# Patient Record
Sex: Male | Born: 1935 | Race: White | Hispanic: No | Smoking: Former smoker
Health system: Southern US, Community
[De-identification: ages and names within clinical notes are randomized; demographics above are authoritative.]

## PROBLEM LIST (undated history)

## (undated) DIAGNOSIS — E119 Type 2 diabetes mellitus without complications: Secondary | ICD-10-CM

## (undated) DIAGNOSIS — K922 Gastrointestinal hemorrhage, unspecified: Secondary | ICD-10-CM

## (undated) DIAGNOSIS — I2510 Atherosclerotic heart disease of native coronary artery without angina pectoris: Secondary | ICD-10-CM

## (undated) DIAGNOSIS — I4891 Unspecified atrial fibrillation: Secondary | ICD-10-CM

## (undated) DIAGNOSIS — Z950 Presence of cardiac pacemaker: Secondary | ICD-10-CM

## (undated) DIAGNOSIS — F329 Major depressive disorder, single episode, unspecified: Secondary | ICD-10-CM

## (undated) DIAGNOSIS — I509 Heart failure, unspecified: Secondary | ICD-10-CM

## (undated) DIAGNOSIS — Z789 Other specified health status: Secondary | ICD-10-CM

## (undated) DIAGNOSIS — N182 Chronic kidney disease, stage 2 (mild): Secondary | ICD-10-CM

## (undated) DIAGNOSIS — I495 Sick sinus syndrome: Secondary | ICD-10-CM

## (undated) DIAGNOSIS — I219 Acute myocardial infarction, unspecified: Secondary | ICD-10-CM

## (undated) DIAGNOSIS — F32A Depression, unspecified: Secondary | ICD-10-CM

## (undated) DIAGNOSIS — K219 Gastro-esophageal reflux disease without esophagitis: Secondary | ICD-10-CM

## (undated) DIAGNOSIS — I951 Orthostatic hypotension: Secondary | ICD-10-CM

## (undated) DIAGNOSIS — N289 Disorder of kidney and ureter, unspecified: Secondary | ICD-10-CM

## (undated) DIAGNOSIS — I639 Cerebral infarction, unspecified: Secondary | ICD-10-CM

## (undated) DIAGNOSIS — R531 Weakness: Secondary | ICD-10-CM

## (undated) DIAGNOSIS — E785 Hyperlipidemia, unspecified: Secondary | ICD-10-CM

## (undated) DIAGNOSIS — H409 Unspecified glaucoma: Secondary | ICD-10-CM

## (undated) DIAGNOSIS — W19XXXA Unspecified fall, initial encounter: Secondary | ICD-10-CM

## (undated) DIAGNOSIS — R42 Dizziness and giddiness: Secondary | ICD-10-CM

## (undated) DIAGNOSIS — F0390 Unspecified dementia without behavioral disturbance: Secondary | ICD-10-CM

## (undated) DIAGNOSIS — F0150 Vascular dementia without behavioral disturbance: Secondary | ICD-10-CM

## (undated) DIAGNOSIS — D649 Anemia, unspecified: Secondary | ICD-10-CM

## (undated) HISTORY — DX: Disorder of kidney and ureter, unspecified: N28.9

## (undated) HISTORY — DX: Orthostatic hypotension: I95.1

## (undated) HISTORY — DX: Other specified health status: Z78.9

## (undated) HISTORY — DX: Gastrointestinal hemorrhage, unspecified: K92.2

## (undated) HISTORY — DX: Atherosclerotic heart disease of native coronary artery without angina pectoris: I25.10

## (undated) HISTORY — DX: Unspecified atrial fibrillation: I48.91

## (undated) HISTORY — DX: Hyperlipidemia, unspecified: E78.5

## (undated) HISTORY — DX: Sick sinus syndrome: I49.5

## (undated) HISTORY — DX: Acute myocardial infarction, unspecified: I21.9

## (undated) HISTORY — DX: Dizziness and giddiness: R42

## (undated) HISTORY — DX: Chronic kidney disease, stage 2 (mild): N18.2

## (undated) HISTORY — DX: Type 2 diabetes mellitus without complications: E11.9

## (undated) HISTORY — PX: CORONARY STENT PLACEMENT: SHX1402

## (undated) HISTORY — DX: Unspecified dementia, unspecified severity, without behavioral disturbance, psychotic disturbance, mood disturbance, and anxiety: F03.90

## (undated) HISTORY — PX: PACEMAKER PLACEMENT: SHX43

## (undated) HISTORY — DX: Presence of cardiac pacemaker: Z95.0

---

## 1997-12-13 ENCOUNTER — Emergency Department (HOSPITAL_COMMUNITY): Admission: EM | Admit: 1997-12-13 | Discharge: 1997-12-13 | Payer: Self-pay | Admitting: Internal Medicine

## 1997-12-13 ENCOUNTER — Encounter: Payer: Self-pay | Admitting: Internal Medicine

## 1998-03-05 ENCOUNTER — Inpatient Hospital Stay (HOSPITAL_COMMUNITY): Admission: EM | Admit: 1998-03-05 | Discharge: 1998-03-08 | Payer: Self-pay | Admitting: Emergency Medicine

## 1998-04-16 ENCOUNTER — Other Ambulatory Visit: Admission: RE | Admit: 1998-04-16 | Discharge: 1998-04-16 | Payer: Self-pay | Admitting: Urology

## 1998-06-01 ENCOUNTER — Encounter: Payer: Self-pay | Admitting: Emergency Medicine

## 1998-06-01 ENCOUNTER — Emergency Department (HOSPITAL_COMMUNITY): Admission: EM | Admit: 1998-06-01 | Discharge: 1998-06-02 | Payer: Self-pay | Admitting: Emergency Medicine

## 1998-06-02 ENCOUNTER — Emergency Department (HOSPITAL_COMMUNITY): Admission: EM | Admit: 1998-06-02 | Discharge: 1998-06-02 | Payer: Self-pay | Admitting: Emergency Medicine

## 1999-04-07 ENCOUNTER — Encounter: Payer: Self-pay | Admitting: Family Medicine

## 1999-04-07 ENCOUNTER — Encounter: Admission: RE | Admit: 1999-04-07 | Discharge: 1999-04-07 | Payer: Self-pay | Admitting: Family Medicine

## 2001-04-12 ENCOUNTER — Ambulatory Visit (HOSPITAL_COMMUNITY): Admission: RE | Admit: 2001-04-12 | Discharge: 2001-04-12 | Payer: Self-pay | Admitting: Gastroenterology

## 2001-04-12 ENCOUNTER — Encounter: Payer: Self-pay | Admitting: Gastroenterology

## 2001-08-28 ENCOUNTER — Encounter (INDEPENDENT_AMBULATORY_CARE_PROVIDER_SITE_OTHER): Payer: Self-pay | Admitting: Gastroenterology

## 2002-01-27 ENCOUNTER — Encounter: Payer: Self-pay | Admitting: Emergency Medicine

## 2002-01-27 ENCOUNTER — Emergency Department (HOSPITAL_COMMUNITY): Admission: EM | Admit: 2002-01-27 | Discharge: 2002-01-27 | Payer: Self-pay | Admitting: Emergency Medicine

## 2002-02-02 ENCOUNTER — Ambulatory Visit (HOSPITAL_COMMUNITY): Admission: RE | Admit: 2002-02-02 | Discharge: 2002-02-02 | Payer: Self-pay | Admitting: Ophthalmology

## 2002-02-02 ENCOUNTER — Encounter: Payer: Self-pay | Admitting: Ophthalmology

## 2002-02-04 ENCOUNTER — Emergency Department (HOSPITAL_COMMUNITY): Admission: EM | Admit: 2002-02-04 | Discharge: 2002-02-04 | Payer: Self-pay | Admitting: Emergency Medicine

## 2002-11-07 ENCOUNTER — Ambulatory Visit (HOSPITAL_COMMUNITY): Admission: RE | Admit: 2002-11-07 | Discharge: 2002-11-07 | Payer: Self-pay | Admitting: Family Medicine

## 2002-11-07 ENCOUNTER — Encounter: Payer: Self-pay | Admitting: Family Medicine

## 2003-12-08 ENCOUNTER — Inpatient Hospital Stay (HOSPITAL_COMMUNITY): Admission: EM | Admit: 2003-12-08 | Discharge: 2003-12-12 | Payer: Self-pay | Admitting: Emergency Medicine

## 2004-01-02 ENCOUNTER — Inpatient Hospital Stay (HOSPITAL_COMMUNITY): Admission: EM | Admit: 2004-01-02 | Discharge: 2004-01-06 | Payer: Self-pay

## 2004-02-04 ENCOUNTER — Ambulatory Visit: Payer: Self-pay | Admitting: *Deleted

## 2004-02-04 ENCOUNTER — Ambulatory Visit: Payer: Self-pay | Admitting: Cardiology

## 2004-02-25 ENCOUNTER — Ambulatory Visit: Payer: Self-pay | Admitting: Cardiology

## 2004-03-10 ENCOUNTER — Ambulatory Visit: Payer: Self-pay | Admitting: Cardiology

## 2004-03-17 ENCOUNTER — Ambulatory Visit: Payer: Self-pay | Admitting: *Deleted

## 2004-03-24 ENCOUNTER — Ambulatory Visit: Payer: Self-pay | Admitting: Internal Medicine

## 2004-04-02 ENCOUNTER — Ambulatory Visit: Payer: Self-pay | Admitting: Cardiology

## 2004-04-16 ENCOUNTER — Ambulatory Visit: Payer: Self-pay | Admitting: Pulmonary Disease

## 2004-04-19 ENCOUNTER — Ambulatory Visit: Payer: Self-pay | Admitting: Internal Medicine

## 2004-04-24 ENCOUNTER — Ambulatory Visit: Payer: Self-pay | Admitting: Cardiology

## 2004-04-24 ENCOUNTER — Inpatient Hospital Stay (HOSPITAL_COMMUNITY): Admission: EM | Admit: 2004-04-24 | Discharge: 2004-04-26 | Payer: Self-pay | Admitting: Emergency Medicine

## 2004-05-12 ENCOUNTER — Ambulatory Visit: Payer: Self-pay | Admitting: *Deleted

## 2004-05-12 ENCOUNTER — Ambulatory Visit: Payer: Self-pay | Admitting: Cardiology

## 2004-05-31 ENCOUNTER — Ambulatory Visit: Payer: Self-pay

## 2004-09-22 ENCOUNTER — Ambulatory Visit: Payer: Self-pay | Admitting: Cardiology

## 2004-10-25 ENCOUNTER — Ambulatory Visit: Payer: Self-pay | Admitting: Cardiology

## 2004-10-28 ENCOUNTER — Ambulatory Visit: Payer: Self-pay | Admitting: Cardiology

## 2004-11-30 ENCOUNTER — Ambulatory Visit: Payer: Self-pay

## 2005-01-05 ENCOUNTER — Ambulatory Visit: Payer: Self-pay | Admitting: Gastroenterology

## 2005-01-31 ENCOUNTER — Ambulatory Visit: Payer: Self-pay | Admitting: Cardiology

## 2005-02-01 ENCOUNTER — Ambulatory Visit: Payer: Self-pay | Admitting: Gastroenterology

## 2005-02-04 ENCOUNTER — Ambulatory Visit: Payer: Self-pay | Admitting: Cardiology

## 2005-02-05 ENCOUNTER — Inpatient Hospital Stay (HOSPITAL_COMMUNITY): Admission: EM | Admit: 2005-02-05 | Discharge: 2005-02-16 | Payer: Self-pay | Admitting: Emergency Medicine

## 2005-02-06 ENCOUNTER — Ambulatory Visit: Payer: Self-pay | Admitting: Gastroenterology

## 2005-02-22 ENCOUNTER — Ambulatory Visit: Payer: Self-pay | Admitting: *Deleted

## 2005-02-28 ENCOUNTER — Ambulatory Visit: Payer: Self-pay

## 2005-02-28 ENCOUNTER — Ambulatory Visit: Payer: Self-pay | Admitting: Cardiology

## 2005-03-16 ENCOUNTER — Ambulatory Visit: Payer: Self-pay | Admitting: Cardiology

## 2005-03-16 ENCOUNTER — Ambulatory Visit: Payer: Self-pay | Admitting: Internal Medicine

## 2005-04-01 ENCOUNTER — Ambulatory Visit: Payer: Self-pay | Admitting: Cardiology

## 2005-05-13 ENCOUNTER — Ambulatory Visit: Payer: Self-pay | Admitting: Cardiology

## 2005-07-27 ENCOUNTER — Inpatient Hospital Stay (HOSPITAL_COMMUNITY): Admission: EM | Admit: 2005-07-27 | Discharge: 2005-07-29 | Payer: Self-pay | Admitting: Emergency Medicine

## 2005-07-27 ENCOUNTER — Encounter: Payer: Self-pay | Admitting: Internal Medicine

## 2005-07-27 ENCOUNTER — Ambulatory Visit: Payer: Self-pay | Admitting: Cardiology

## 2005-07-28 ENCOUNTER — Encounter: Payer: Self-pay | Admitting: Internal Medicine

## 2005-08-11 ENCOUNTER — Ambulatory Visit: Payer: Self-pay | Admitting: Cardiology

## 2005-09-29 ENCOUNTER — Inpatient Hospital Stay (HOSPITAL_COMMUNITY): Admission: EM | Admit: 2005-09-29 | Discharge: 2005-10-03 | Payer: Self-pay | Admitting: Emergency Medicine

## 2005-09-29 ENCOUNTER — Ambulatory Visit: Payer: Self-pay | Admitting: Cardiology

## 2005-09-30 ENCOUNTER — Encounter: Payer: Self-pay | Admitting: Cardiology

## 2005-10-31 ENCOUNTER — Ambulatory Visit: Payer: Self-pay | Admitting: Cardiology

## 2005-11-02 ENCOUNTER — Ambulatory Visit: Payer: Self-pay | Admitting: Cardiology

## 2005-11-02 ENCOUNTER — Inpatient Hospital Stay (HOSPITAL_COMMUNITY): Admission: AD | Admit: 2005-11-02 | Discharge: 2005-11-05 | Payer: Self-pay | Admitting: Cardiology

## 2005-12-09 ENCOUNTER — Ambulatory Visit: Payer: Self-pay | Admitting: Cardiology

## 2005-12-14 ENCOUNTER — Ambulatory Visit: Payer: Self-pay

## 2005-12-14 ENCOUNTER — Ambulatory Visit: Payer: Self-pay | Admitting: Cardiology

## 2005-12-15 ENCOUNTER — Ambulatory Visit: Payer: Self-pay | Admitting: Cardiology

## 2005-12-26 ENCOUNTER — Ambulatory Visit: Payer: Self-pay | Admitting: Cardiology

## 2005-12-27 ENCOUNTER — Ambulatory Visit: Payer: Self-pay | Admitting: Internal Medicine

## 2006-02-08 ENCOUNTER — Ambulatory Visit: Payer: Self-pay | Admitting: Internal Medicine

## 2006-03-10 ENCOUNTER — Ambulatory Visit: Payer: Self-pay | Admitting: Internal Medicine

## 2006-04-10 ENCOUNTER — Ambulatory Visit: Payer: Self-pay | Admitting: Cardiology

## 2006-04-11 ENCOUNTER — Ambulatory Visit: Payer: Self-pay | Admitting: Cardiology

## 2006-04-11 LAB — CONVERTED CEMR LAB
ALT: 25 units/L (ref 0–40)
AST: 22 units/L (ref 0–37)
Albumin: 3.4 g/dL — ABNORMAL LOW (ref 3.5–5.2)
Alkaline Phosphatase: 45 units/L (ref 39–117)
BUN: 24 mg/dL — ABNORMAL HIGH (ref 6–23)
Bilirubin, Direct: 0.1 mg/dL (ref 0.0–0.3)
CO2: 30 meq/L (ref 19–32)
Calcium: 8.9 mg/dL (ref 8.4–10.5)
Chloride: 100 meq/L (ref 96–112)
Cholesterol: 122 mg/dL (ref 0–200)
Creatinine, Ser: 1 mg/dL (ref 0.4–1.5)
Digitoxin Lvl: 0.4 ng/mL — ABNORMAL LOW (ref 0.8–2.0)
GFR calc Af Amer: 92 mL/min
GFR calc non Af Amer: 76 mL/min
Glucose, Bld: 235 mg/dL — ABNORMAL HIGH (ref 70–99)
HCT: 35.7 % — ABNORMAL LOW (ref 39.0–52.0)
HDL: 41.5 mg/dL (ref >39.0)
Hemoglobin: 11.4 g/dL — ABNORMAL LOW (ref 13.0–17.0)
LDL Cholesterol: 56 mg/dL (ref 0–99)
MCHC: 31.9 g/dL (ref 30.0–36.0)
MCV: 75.5 fL — ABNORMAL LOW (ref 78.0–100.0)
Platelets: 183 10*3/uL (ref 150–400)
Potassium: 4.8 meq/L (ref 3.5–5.1)
RBC: 4.72 M/uL (ref 4.22–5.81)
RDW: 15.8 % — ABNORMAL HIGH (ref 11.5–14.6)
Sodium: 137 meq/L (ref 135–145)
Total Bilirubin: 1 mg/dL (ref 0.3–1.2)
Total CHOL/HDL Ratio: 2.9
Total Protein: 6.5 g/dL (ref 6.0–8.3)
Triglycerides: 121 mg/dL (ref 0–149)
VLDL: 24 mg/dL (ref 0–40)
WBC: 5.9 10*3/uL (ref 4.5–10.5)

## 2006-05-05 ENCOUNTER — Ambulatory Visit: Payer: Self-pay | Admitting: Cardiology

## 2006-06-30 ENCOUNTER — Ambulatory Visit: Payer: Self-pay | Admitting: Cardiology

## 2006-08-15 ENCOUNTER — Ambulatory Visit: Payer: Self-pay | Admitting: Cardiology

## 2006-08-15 LAB — CONVERTED CEMR LAB
BUN: 27 mg/dL — ABNORMAL HIGH (ref 6–23)
CO2: 28 meq/L (ref 19–32)
Calcium: 8.9 mg/dL (ref 8.4–10.5)
Chloride: 110 meq/L (ref 96–112)
Creatinine, Ser: 1.1 mg/dL (ref 0.4–1.5)
GFR calc Af Amer: 82 mL/min
GFR calc non Af Amer: 68 mL/min
Glucose, Bld: 181 mg/dL — ABNORMAL HIGH (ref 70–99)
Potassium: 4.9 meq/L (ref 3.5–5.1)
Sodium: 141 meq/L (ref 135–145)

## 2006-08-25 ENCOUNTER — Ambulatory Visit: Payer: Self-pay | Admitting: Cardiology

## 2006-10-20 ENCOUNTER — Ambulatory Visit: Payer: Self-pay | Admitting: Cardiology

## 2006-12-15 ENCOUNTER — Ambulatory Visit: Payer: Self-pay | Admitting: Cardiology

## 2006-12-25 ENCOUNTER — Ambulatory Visit: Payer: Self-pay

## 2007-01-31 ENCOUNTER — Ambulatory Visit: Payer: Self-pay | Admitting: Internal Medicine

## 2007-02-14 ENCOUNTER — Ambulatory Visit: Payer: Self-pay | Admitting: Cardiology

## 2007-02-19 ENCOUNTER — Ambulatory Visit: Payer: Self-pay | Admitting: Cardiology

## 2007-02-19 LAB — CONVERTED CEMR LAB
Albumin: 3.7 g/dL (ref 3.5–5.2)
Basophils Absolute: 0 10*3/uL (ref 0.0–0.1)
Bilirubin, Direct: 0.1 mg/dL (ref 0.0–0.3)
Cholesterol: 103 mg/dL (ref 0–200)
Eosinophils Absolute: 0.3 10*3/uL (ref 0.0–0.6)
Eosinophils Relative: 4.8 % (ref 0.0–5.0)
GFR calc non Af Amer: 62 mL/min
Glucose, Bld: 177 mg/dL — ABNORMAL HIGH (ref 70–99)
HCT: 33.9 % — ABNORMAL LOW (ref 39.0–52.0)
Hemoglobin: 11 g/dL — ABNORMAL LOW (ref 13.0–17.0)
Lymphocytes Relative: 25 % (ref 12.0–46.0)
MCHC: 32.3 g/dL (ref 30.0–36.0)
MCV: 73.7 fL — ABNORMAL LOW (ref 78.0–100.0)
Monocytes Absolute: 0.6 10*3/uL (ref 0.2–0.7)
Neutro Abs: 3.4 10*3/uL (ref 1.4–7.7)
Neutrophils Relative %: 58.3 % (ref 43.0–77.0)
Potassium: 5.2 meq/L — ABNORMAL HIGH (ref 3.5–5.1)
Sodium: 138 meq/L (ref 135–145)
TSH: 1.63 microintl units/mL (ref 0.35–5.50)
Total Bilirubin: 0.9 mg/dL (ref 0.3–1.2)
Total CHOL/HDL Ratio: 3

## 2007-03-20 ENCOUNTER — Emergency Department (HOSPITAL_COMMUNITY): Admission: EM | Admit: 2007-03-20 | Discharge: 2007-03-21 | Payer: Self-pay | Admitting: *Deleted

## 2007-04-06 ENCOUNTER — Ambulatory Visit: Payer: Self-pay | Admitting: Cardiology

## 2007-08-22 ENCOUNTER — Ambulatory Visit: Payer: Self-pay | Admitting: Cardiology

## 2007-10-05 ENCOUNTER — Ambulatory Visit: Payer: Self-pay | Admitting: Cardiology

## 2008-01-04 ENCOUNTER — Ambulatory Visit: Payer: Self-pay | Admitting: Cardiology

## 2008-01-07 ENCOUNTER — Ambulatory Visit: Payer: Self-pay | Admitting: Cardiology

## 2008-02-28 ENCOUNTER — Ambulatory Visit: Payer: Self-pay | Admitting: Cardiology

## 2008-02-28 DIAGNOSIS — E785 Hyperlipidemia, unspecified: Secondary | ICD-10-CM

## 2008-02-28 DIAGNOSIS — I495 Sick sinus syndrome: Secondary | ICD-10-CM

## 2008-02-28 DIAGNOSIS — Z950 Presence of cardiac pacemaker: Secondary | ICD-10-CM

## 2008-02-28 DIAGNOSIS — I951 Orthostatic hypotension: Secondary | ICD-10-CM

## 2008-02-28 DIAGNOSIS — I4891 Unspecified atrial fibrillation: Secondary | ICD-10-CM | POA: Insufficient documentation

## 2008-02-28 DIAGNOSIS — I2510 Atherosclerotic heart disease of native coronary artery without angina pectoris: Secondary | ICD-10-CM

## 2008-04-07 ENCOUNTER — Ambulatory Visit: Payer: Self-pay | Admitting: Cardiology

## 2008-05-01 ENCOUNTER — Ambulatory Visit: Payer: Self-pay | Admitting: Cardiology

## 2008-05-01 ENCOUNTER — Encounter (INDEPENDENT_AMBULATORY_CARE_PROVIDER_SITE_OTHER): Payer: Self-pay | Admitting: *Deleted

## 2008-05-14 ENCOUNTER — Encounter: Payer: Self-pay | Admitting: Cardiology

## 2008-05-14 ENCOUNTER — Telehealth (INDEPENDENT_AMBULATORY_CARE_PROVIDER_SITE_OTHER): Payer: Self-pay | Admitting: Gastroenterology

## 2008-05-14 ENCOUNTER — Ambulatory Visit: Payer: Self-pay | Admitting: Gastroenterology

## 2008-05-14 DIAGNOSIS — R1319 Other dysphagia: Secondary | ICD-10-CM

## 2008-05-19 ENCOUNTER — Telehealth: Payer: Self-pay | Admitting: Gastroenterology

## 2008-05-20 ENCOUNTER — Ambulatory Visit: Payer: Self-pay | Admitting: Gastroenterology

## 2008-05-29 ENCOUNTER — Ambulatory Visit (HOSPITAL_COMMUNITY): Admission: RE | Admit: 2008-05-29 | Discharge: 2008-05-29 | Payer: Self-pay | Admitting: Gastroenterology

## 2008-05-29 ENCOUNTER — Ambulatory Visit: Payer: Self-pay | Admitting: Gastroenterology

## 2008-06-30 ENCOUNTER — Telehealth: Payer: Self-pay | Admitting: Gastroenterology

## 2008-07-07 ENCOUNTER — Ambulatory Visit: Payer: Self-pay | Admitting: Cardiology

## 2008-07-14 ENCOUNTER — Ambulatory Visit: Payer: Self-pay | Admitting: Gastroenterology

## 2008-07-24 ENCOUNTER — Ambulatory Visit: Payer: Self-pay | Admitting: Gastroenterology

## 2008-07-24 ENCOUNTER — Ambulatory Visit (HOSPITAL_COMMUNITY): Admission: RE | Admit: 2008-07-24 | Discharge: 2008-07-24 | Payer: Self-pay | Admitting: Gastroenterology

## 2008-07-24 ENCOUNTER — Telehealth (INDEPENDENT_AMBULATORY_CARE_PROVIDER_SITE_OTHER): Payer: Self-pay | Admitting: *Deleted

## 2008-07-24 DIAGNOSIS — K319 Disease of stomach and duodenum, unspecified: Secondary | ICD-10-CM | POA: Insufficient documentation

## 2008-07-25 ENCOUNTER — Telehealth: Payer: Self-pay | Admitting: Gastroenterology

## 2008-08-21 ENCOUNTER — Ambulatory Visit (HOSPITAL_COMMUNITY): Admission: RE | Admit: 2008-08-21 | Discharge: 2008-08-21 | Payer: Self-pay | Admitting: Gastroenterology

## 2008-08-21 ENCOUNTER — Ambulatory Visit: Payer: Self-pay | Admitting: Gastroenterology

## 2008-10-06 ENCOUNTER — Encounter: Payer: Self-pay | Admitting: Internal Medicine

## 2008-10-06 ENCOUNTER — Ambulatory Visit: Payer: Self-pay | Admitting: Cardiology

## 2008-12-03 ENCOUNTER — Inpatient Hospital Stay (HOSPITAL_COMMUNITY): Admission: AC | Admit: 2008-12-03 | Discharge: 2008-12-05 | Payer: Self-pay | Admitting: Emergency Medicine

## 2008-12-12 ENCOUNTER — Ambulatory Visit: Payer: Self-pay | Admitting: Cardiology

## 2008-12-15 LAB — CONVERTED CEMR LAB: Digitoxin Lvl: 0.5 ng/mL — ABNORMAL LOW (ref 0.8–2.0)

## 2008-12-29 ENCOUNTER — Telehealth: Payer: Self-pay | Admitting: Cardiology

## 2009-01-05 ENCOUNTER — Ambulatory Visit: Payer: Self-pay | Admitting: Cardiology

## 2009-01-05 ENCOUNTER — Encounter: Payer: Self-pay | Admitting: Internal Medicine

## 2009-01-12 ENCOUNTER — Encounter: Payer: Self-pay | Admitting: Cardiology

## 2009-01-12 ENCOUNTER — Observation Stay (HOSPITAL_COMMUNITY): Admission: EM | Admit: 2009-01-12 | Discharge: 2009-01-13 | Payer: Self-pay | Admitting: Emergency Medicine

## 2009-01-16 ENCOUNTER — Telehealth: Payer: Self-pay | Admitting: Cardiology

## 2009-01-22 ENCOUNTER — Telehealth: Payer: Self-pay | Admitting: Cardiology

## 2009-01-28 ENCOUNTER — Telehealth: Payer: Self-pay | Admitting: Cardiology

## 2009-01-29 ENCOUNTER — Ambulatory Visit: Payer: Self-pay | Admitting: Cardiology

## 2009-01-29 DIAGNOSIS — R269 Unspecified abnormalities of gait and mobility: Secondary | ICD-10-CM | POA: Insufficient documentation

## 2009-02-05 LAB — CONVERTED CEMR LAB
BUN: 22 mg/dL (ref 6–23)
Basophils Absolute: 0.1 10*3/uL (ref 0.0–0.1)
Chloride: 105 meq/L (ref 96–112)
Creatinine, Ser: 1.2 mg/dL (ref 0.4–1.5)
Digitoxin Lvl: 1.4 ng/mL (ref 0.8–2.0)
Eosinophils Relative: 5.4 % — ABNORMAL HIGH (ref 0.0–5.0)
GFR calc non Af Amer: 61.21 mL/min (ref >60)
HCT: 35.6 % — ABNORMAL LOW (ref 39.0–52.0)
Lymphocytes Relative: 30.5 % (ref 12.0–46.0)
Lymphs Abs: 1.5 10*3/uL (ref 0.7–4.0)
Monocytes Relative: 8.8 % (ref 3.0–12.0)
Platelets: 155 10*3/uL (ref 150.0–400.0)
Potassium: 4.6 meq/L (ref 3.5–5.1)
Pro B Natriuretic peptide (BNP): 316 pg/mL — ABNORMAL HIGH (ref 0.0–100.0)
RDW: 17.7 % — ABNORMAL HIGH (ref 11.5–14.6)
WBC: 4.8 10*3/uL (ref 4.5–10.5)

## 2009-02-10 ENCOUNTER — Telehealth: Payer: Self-pay | Admitting: Cardiology

## 2009-02-12 ENCOUNTER — Telehealth: Payer: Self-pay | Admitting: Cardiology

## 2009-02-13 ENCOUNTER — Ambulatory Visit: Payer: Self-pay

## 2009-02-13 ENCOUNTER — Ambulatory Visit (HOSPITAL_COMMUNITY): Admission: RE | Admit: 2009-02-13 | Discharge: 2009-02-13 | Payer: Self-pay | Admitting: Cardiology

## 2009-02-13 ENCOUNTER — Encounter: Payer: Self-pay | Admitting: Cardiology

## 2009-02-13 ENCOUNTER — Ambulatory Visit: Payer: Self-pay | Admitting: Cardiology

## 2009-03-11 ENCOUNTER — Ambulatory Visit: Payer: Self-pay | Admitting: Cardiology

## 2009-03-13 ENCOUNTER — Telehealth: Payer: Self-pay | Admitting: Cardiology

## 2009-03-18 ENCOUNTER — Encounter: Payer: Self-pay | Admitting: Cardiology

## 2009-03-19 ENCOUNTER — Ambulatory Visit: Payer: Self-pay | Admitting: Cardiology

## 2009-03-24 LAB — CONVERTED CEMR LAB
CO2: 29 meq/L (ref 19–32)
Chloride: 102 meq/L (ref 96–112)
Digitoxin Lvl: 0.8 ng/mL (ref 0.8–2.0)
Potassium: 4.1 meq/L (ref 3.5–5.1)
Sodium: 140 meq/L (ref 135–145)

## 2009-04-06 ENCOUNTER — Ambulatory Visit: Payer: Self-pay | Admitting: Cardiology

## 2009-04-06 ENCOUNTER — Encounter: Payer: Self-pay | Admitting: Internal Medicine

## 2009-04-18 ENCOUNTER — Ambulatory Visit: Payer: Self-pay | Admitting: Cardiology

## 2009-04-18 ENCOUNTER — Inpatient Hospital Stay (HOSPITAL_COMMUNITY): Admission: EM | Admit: 2009-04-18 | Discharge: 2009-04-22 | Payer: Self-pay | Admitting: Emergency Medicine

## 2009-04-21 ENCOUNTER — Encounter: Payer: Self-pay | Admitting: Cardiology

## 2009-05-10 ENCOUNTER — Inpatient Hospital Stay (HOSPITAL_COMMUNITY): Admission: EM | Admit: 2009-05-10 | Discharge: 2009-05-14 | Payer: Self-pay | Admitting: Emergency Medicine

## 2009-05-10 ENCOUNTER — Ambulatory Visit: Payer: Self-pay | Admitting: Cardiology

## 2009-05-12 ENCOUNTER — Encounter (INDEPENDENT_AMBULATORY_CARE_PROVIDER_SITE_OTHER): Payer: Self-pay | Admitting: Internal Medicine

## 2009-05-15 ENCOUNTER — Telehealth: Payer: Self-pay | Admitting: Cardiology

## 2009-05-22 ENCOUNTER — Ambulatory Visit: Payer: Self-pay | Admitting: Cardiology

## 2009-05-22 DIAGNOSIS — I5022 Chronic systolic (congestive) heart failure: Secondary | ICD-10-CM

## 2009-05-27 ENCOUNTER — Telehealth: Payer: Self-pay | Admitting: Cardiology

## 2009-05-27 LAB — CONVERTED CEMR LAB
BUN: 42 mg/dL — ABNORMAL HIGH (ref 6–23)
Basophils Relative: 0 % (ref 0.0–3.0)
CO2: 30 meq/L (ref 19–32)
Calcium: 9 mg/dL (ref 8.4–10.5)
Eosinophils Relative: 2.2 % (ref 0.0–5.0)
GFR calc non Af Amer: 55.77 mL/min (ref >60)
Glucose, Bld: 127 mg/dL — ABNORMAL HIGH (ref 70–99)
HCT: 37.8 % — ABNORMAL LOW (ref 39.0–52.0)
Hemoglobin: 12.1 g/dL — ABNORMAL LOW (ref 13.0–17.0)
Lymphs Abs: 1.4 10*3/uL (ref 0.7–4.0)
MCV: 83.8 fL (ref 78.0–100.0)
Monocytes Absolute: 0.6 10*3/uL (ref 0.1–1.0)
Monocytes Relative: 10.9 % (ref 3.0–12.0)
Neutro Abs: 3.3 10*3/uL (ref 1.4–7.7)
Platelets: 226 10*3/uL (ref 150.0–400.0)
RBC: 4.51 M/uL (ref 4.22–5.81)
Sodium: 142 meq/L (ref 135–145)
WBC: 5.4 10*3/uL (ref 4.5–10.5)

## 2009-06-01 ENCOUNTER — Telehealth: Payer: Self-pay | Admitting: Gastroenterology

## 2009-06-01 ENCOUNTER — Telehealth: Payer: Self-pay | Admitting: Cardiology

## 2009-06-02 ENCOUNTER — Telehealth: Payer: Self-pay | Admitting: Cardiology

## 2009-06-02 ENCOUNTER — Ambulatory Visit: Payer: Self-pay | Admitting: Gastroenterology

## 2009-06-02 DIAGNOSIS — I2510 Atherosclerotic heart disease of native coronary artery without angina pectoris: Secondary | ICD-10-CM

## 2009-06-02 DIAGNOSIS — N259 Disorder resulting from impaired renal tubular function, unspecified: Secondary | ICD-10-CM | POA: Insufficient documentation

## 2009-06-02 DIAGNOSIS — K222 Esophageal obstruction: Secondary | ICD-10-CM | POA: Insufficient documentation

## 2009-06-02 DIAGNOSIS — K219 Gastro-esophageal reflux disease without esophagitis: Secondary | ICD-10-CM | POA: Insufficient documentation

## 2009-06-02 DIAGNOSIS — E119 Type 2 diabetes mellitus without complications: Secondary | ICD-10-CM

## 2009-06-02 DIAGNOSIS — F329 Major depressive disorder, single episode, unspecified: Secondary | ICD-10-CM | POA: Insufficient documentation

## 2009-06-02 DIAGNOSIS — R634 Abnormal weight loss: Secondary | ICD-10-CM | POA: Insufficient documentation

## 2009-06-03 ENCOUNTER — Telehealth: Payer: Self-pay | Admitting: Cardiology

## 2009-06-19 ENCOUNTER — Ambulatory Visit: Payer: Self-pay | Admitting: Cardiology

## 2009-06-19 DIAGNOSIS — R079 Chest pain, unspecified: Secondary | ICD-10-CM

## 2009-06-29 ENCOUNTER — Inpatient Hospital Stay (HOSPITAL_COMMUNITY): Admission: EM | Admit: 2009-06-29 | Discharge: 2009-07-01 | Payer: Self-pay | Admitting: Emergency Medicine

## 2009-07-06 ENCOUNTER — Ambulatory Visit: Payer: Self-pay | Admitting: Internal Medicine

## 2009-08-07 ENCOUNTER — Ambulatory Visit: Payer: Self-pay | Admitting: Cardiology

## 2009-09-04 ENCOUNTER — Telehealth: Payer: Self-pay | Admitting: Cardiology

## 2009-10-05 ENCOUNTER — Ambulatory Visit: Payer: Self-pay | Admitting: Internal Medicine

## 2009-10-19 ENCOUNTER — Encounter (INDEPENDENT_AMBULATORY_CARE_PROVIDER_SITE_OTHER): Payer: Self-pay | Admitting: Internal Medicine

## 2009-10-20 ENCOUNTER — Inpatient Hospital Stay (HOSPITAL_COMMUNITY): Admission: EM | Admit: 2009-10-20 | Discharge: 2009-10-21 | Payer: Self-pay | Admitting: Emergency Medicine

## 2009-10-20 ENCOUNTER — Ambulatory Visit: Payer: Self-pay | Admitting: Cardiology

## 2009-10-20 ENCOUNTER — Encounter (INDEPENDENT_AMBULATORY_CARE_PROVIDER_SITE_OTHER): Payer: Self-pay | Admitting: Internal Medicine

## 2009-10-20 ENCOUNTER — Ambulatory Visit: Payer: Self-pay | Admitting: Surgery

## 2009-11-17 ENCOUNTER — Emergency Department (HOSPITAL_COMMUNITY): Admission: EM | Admit: 2009-11-17 | Discharge: 2009-11-17 | Payer: Self-pay | Admitting: Emergency Medicine

## 2010-01-04 ENCOUNTER — Ambulatory Visit: Payer: Self-pay | Admitting: Internal Medicine

## 2010-01-21 ENCOUNTER — Encounter: Payer: Self-pay | Admitting: Cardiology

## 2010-01-21 ENCOUNTER — Ambulatory Visit: Payer: Self-pay | Admitting: Cardiology

## 2010-01-22 ENCOUNTER — Encounter (INDEPENDENT_AMBULATORY_CARE_PROVIDER_SITE_OTHER): Payer: Self-pay | Admitting: *Deleted

## 2010-01-22 LAB — CONVERTED CEMR LAB
BUN: 25 mg/dL — ABNORMAL HIGH (ref 6–23)
Basophils Absolute: 0 10*3/uL (ref 0.0–0.1)
Creatinine, Ser: 1.1 mg/dL (ref 0.4–1.5)
GFR calc non Af Amer: 68.23 mL/min (ref >60)
Glucose, Bld: 145 mg/dL — ABNORMAL HIGH (ref 70–99)
HCT: 34.4 % — ABNORMAL LOW (ref 39.0–52.0)
Hgb A1c MFr Bld: 6.9 % — ABNORMAL HIGH (ref 4.6–6.5)
Lymphs Abs: 1.1 10*3/uL (ref 0.7–4.0)
MCV: 87.6 fL (ref 78.0–100.0)
Monocytes Absolute: 0.5 10*3/uL (ref 0.1–1.0)
Neutrophils Relative %: 70.9 % (ref 43.0–77.0)
Platelets: 165 10*3/uL (ref 150.0–400.0)
Potassium: 3.6 meq/L (ref 3.5–5.1)
RDW: 15.7 % — ABNORMAL HIGH (ref 11.5–14.6)

## 2010-03-02 ENCOUNTER — Emergency Department (HOSPITAL_COMMUNITY)
Admission: EM | Admit: 2010-03-02 | Discharge: 2010-03-02 | Payer: Self-pay | Source: Home / Self Care | Admitting: Emergency Medicine

## 2010-04-05 ENCOUNTER — Encounter: Payer: Self-pay | Admitting: Internal Medicine

## 2010-04-27 NOTE — Miscellaneous (Signed)
Summary: dx correction  . Clinical Lists Changes  Problems: Changed problem from PACEMAKER (ICD-V45.Marland Kitchen01) to PACEMAKER, PERMANENT (ICD-V45.01)  changed the incorrect dx code to correct dx code Genella Mech  January 22, 2010 1:15 PM

## 2010-04-27 NOTE — Progress Notes (Signed)
Summary:  med question   Phone Note Call from Patient Call back at [phone]   Caller: Daughter/Sandra Reason for Call: Talk to Nurse Summary of Call: request to speak to nurse about meds Initial call taken by: Migdalia Dk,  June 01, 2009 9:02 AM  Follow-up for Phone Call        S/W daughter and since the BP med change on 2/25 her fathers BP has been running low especially in the am (88/50, 79/68, 86/54). A nurse at the nursing home suggested that they stop the night dose. She stopped it on Tues but she has not seen any results. The highest she has seen his pressure during the day is 127/86, it is normally 107/67 and 109/69. He lives at [facility]. I will page Dr. Juanda Chance, he is at the Cath lab today and tomorrow and then is on vacation the rest of the week.  Follow-up by: Duncan Dull, RN, BSN,  June 01, 2009 9:27 AM  Additional Follow-up for Phone Call Additional follow up Details #1::        0930- Trish called me back, she was holding the pager for Dr. Juanda Chance. Situation was explained to her and she said she would have Dr. Juanda Chance to call me back. Additional Follow-up by: Duncan Dull, RN, BSN,  June 01, 2009 1:57 PM    Additional Follow-up for Phone Call Additional follow up Details #2::    Dr. Juanda Chance never called me back. I will forward to [facility] for further f/u tomorrow.  Follow-up by: Duncan Dull, RN, BSN,  June 01, 2009 5:11 PM

## 2010-04-27 NOTE — Progress Notes (Signed)
Summary: BP   ---- Converted from flag ---- ---- 06/03/2009 10:54 AM, Lenoria Farrier, MD, [facility] wrote: If he is not having any dizziness with the low bp then have him continue metoprolol two times a day.  If he is having dizziness, let's discuss, may consider ablation to control AF rate rather than meds. BB ------------------------------

## 2010-04-27 NOTE — Assessment & Plan Note (Signed)
Summary: f31m  Medications Added DIGOXIN 0.125 MG TABS (DIGOXIN) 2 by mouth daily METOPROLOL TARTRATE 25 MG TABS (METOPROLOL TARTRATE) Take one tablet by mouth three times a day KLOR-CON M20 20 MEQ CR-TABS (POTASSIUM CHLORIDE CRYS CR) 1 by mouth daily PLAVIX 75 MG TABS (CLOPIDOGREL BISULFATE) 1 by mouth daily * HALDOL 0.5MG  1 by mouth two times a day ATIVAN 0.5 MG TABS (LORAZEPAM) as needed      Allergies Added:   Primary Provider:  Benedetto Goad, MD   History of Present Illness: Mr. Hamre is 77 years old and return for followup management of CAD, pacemaker, CHF, and atrial fibrillation. He had a remote anterior MI treated with a stent to the LAD. He has chronic atrial fibrillation which is being managed with rate control. He is not felt to be a Coumadin candidate. He has a DDD pacemaker which has been programmed to VVI. His history systolic CHF with an ejection fraction of 45-50%.  Last July he suffered a stroke and was admitted to the hospital. He also has severe dementia. His main limitation now is related to memory problems.  He's had some swelling in his hands but these had no chest pain shortness of breath or palpitations. When he was in the hospital he was switched from aspirin to Plavix for treatment of a stroke.  Current Medications (verified): 1)  Digoxin 0.125 Mg Tabs (Digoxin) .... 2 By Mouth Daily 2)  Effexor Xr 150 Mg Xr24h-Cap (Venlafaxine Hcl) .... Take 1 Daily 3)  Lipitor 40 Mg Tabs (Atorvastatin Calcium) .... Take 1 Daily 4)  Metformin Hcl 500 Mg Tabs (Metformin Hcl) .... Take 1  Tab Two Times A Day 5)  Nexium 40 Mg Cpdr (Esomeprazole Magnesium) .Marland Kitchen.. 1 Tab By Mouth Two Times A Day 6)  Fludrocortisone Acetate 0.1 Mg Tabs (Fludrocortisone Acetate) .Marland Kitchen.. 1 Tab Two Times A Day 7)  Azasite 1 % Soln (Azithromycin) .Marland Kitchen.. 1 Drop Each Eye 8)  Metoprolol Tartrate 25 Mg Tabs (Metoprolol Tartrate) .... Take One Tablet By Mouth Twice A Day 9)  Furosemide 40 Mg Tabs (Furosemide) ....  1/2 Tab By Mouth Once Daily 10)  Mag-Oxide 400 Mg Tabs (Magnesium Oxide) .Marland Kitchen.. 1 Tab Once Daily 11)  Klor-Con M20 20 Meq Cr-Tabs (Potassium Chloride Crys Cr) .Marland Kitchen.. 1 By Mouth Daily 12)  Plavix 75 Mg Tabs (Clopidogrel Bisulfate) .Marland Kitchen.. 1 By Mouth Daily 13)  Haldol 0.5mg  .... 1 By Mouth Two Times A Day 14)  Ativan 0.5 Mg Tabs (Lorazepam) .... As Needed  Allergies (verified): 1)  ! Sulfa 2)  ! Amiodarone Hcl  Past History:  Past Medical History: Reviewed history from 06/02/2009 and no changes required. Current Problems:  S/P Viatron DDD PACEMAKER (ICD-V45.Marland Kitchen01) HYPOTENSION, ORTHOSTATIC (ICD-458.0) HYPERLIPIDEMIA-MIXED (ICD-272.4) ATRIAL FIBRILLATION (ICD-427.31) SICK SINUS/ TACHY-BRADY SYNDROME (ICD-427.81) CAD, NATIVE VESSEL (ICD-414.01) S/P AWMI 2005 Rx Stent LAD Diabetes Type 2 Mild Chronic Renal insufficiency Intolerance to Amiodarone Upper GI hemorrhage 2006-MALLORY WEISS TEAR 1. Paroxysmal atrial fibrillation, controlled on sotalol -intol amio can't take flecainide 2. Chronic Coumadin therapy -borderlije coumadin candidate due to falls 3. Status post dual-mode, dual-pacing, dual-sensing pacemaker     implantation with good pacer function. 4. Coronary artery status post anterior wall myocardial infarction in     2005, treated with a stent to the left anterior descending. 5. Recurrent dizziness and falls. 6. Diabetes. 7. Hyperlipidemia. 8. Mild renal insufficiency.   Review of Systems       ROS is negative except as outlined in HPI.   Vital  Signs:  Patient profile:   77 year old male Height:      70 inches Weight:      134 pounds BMI:     19.30 Pulse rate:   84 / minute Resp:     16 per minute BP sitting:   108 / 60  (left arm)  Vitals Entered By: Marrion Coy, CNA (January 21, 2010 10:11 AM)  Physical Exam  Additional Exam:  Gen. Well-nourished, in no distress   Neck: No JVD, thyroid not enlarged, no carotid bruits Lungs: No tachypnea, clear without rales,  rhonchi or wheezes Cardiovascular: Rhythm irregular, PMI not displaced,  heart sounds  normal, no murmurs or gallops, no peripheral edema, pulses normal in all 4 extremities. Abdomen: BS normal, abdomen soft and non-tender without masses or organomegaly, no hepatosplenomegaly. MS: No deformities, no cyanosis or clubbing   Neuro:  No focal sns   Skin:  no lesions ,ecchymoses on hands   PPM Specifications Following MD:  Everardo Beals Juanda Chance, MD     PPM Vendor:  [employer]     PPM Model Number:  [id number]     PPM Serial Number:  [id number] PPM DOI:  02/15/2005     PPM Implanting MD:  Everardo Beals. Juanda Chance, MD  Lead 1    Location: RA     DOI: 02/15/2005     Model #: [id number]     Serial #: [id number]     Status: active Lead 2    Location: RV     DOI: 02/15/2005     Model #: [id number]     Serial #: [id number]     Status: active  Magnet Response Rate:  BOL 100 ERI   86  Indications:  Symptomatic brady  Explantation Comments:  TTM'S with [employer]  PPM Follow Up Remote Check?  No Battery Voltage:  2.80 V     Battery Est. Longevity:  5.5 years     Pacer Dependent:  No     Right Ventricle  Amplitude: 8.0 mV, Impedance: 550 ohms, Threshold: 0.875 V at 0.4 msec  Episodes Coumadin:  No Ventricular Pacing:  24%  Parameters Mode:  VVIR     Lower Rate Limit:  60     Upper Rate Limit:  120 Paced AV Delay:  275     Sensed AV Delay:  235 Next Cardiology Appt Due:  06/27/2010 Tech Comments:  A-fib with RVR.  ROV 6 months clinic. Altha Harm, LPN  January 21, 2010 10:45 AM   Impression & Recommendations:  Problem # 1:  CORONARY ARTERY DISEASE (ICD-414.00)  He had a previous anterior MI with a stent to the LAD. He has had no recent chest pain and this problem is stable. The following medications were removed from the medication list:    Aspirin Adult Low Strength 81 Mg Tbec (Aspirin) .Marland Kitchen... Take one tablet by mouth once daily. His updated medication list for this problem includes:    Metoprolol Tartrate 25 Mg Tabs  (Metoprolol tartrate) .Marland Kitchen... Take one tablet by mouth three times a day    Plavix 75 Mg Tabs (Clopidogrel bisulfate) .Marland Kitchen... 1 by mouth daily  Orders: EKG w/ Interpretation (93000) TLB-BMP (Basic Metabolic Panel-BMET) (80048-METABOL) TLB-CBC Platelet - w/Differential (85025-CBCD) TLB-A1C / Hgb A1C (Glycohemoglobin) (83036-A1C)  Problem # 2:  ATRIAL FIBRILLATION (ICD-427.31)  He has chronic atrial fibrillation. Interrogation of his pacemaker showed his rate control was not good. He had rates up into the 150s and 160s. His low blood pressure limits somewhat R.  ability to titrate his medications. He is also not able to take amiodarone due to previous allergy. We will increase his metoprolol to 25 mg t.i.d. The following medications were removed from the medication list:    Aspirin Adult Low Strength 81 Mg Tbec (Aspirin) .Marland Kitchen... Take one tablet by mouth once daily. His updated medication list for this problem includes:    Digoxin 0.125 Mg Tabs (Digoxin) .Marland Kitchen... 2 by mouth daily    Metoprolol Tartrate 25 Mg Tabs (Metoprolol tartrate) .Marland Kitchen... Take one tablet by mouth three times a day    Plavix 75 Mg Tabs (Clopidogrel bisulfate) .Marland Kitchen... 1 by mouth daily  Orders: EKG w/ Interpretation (93000) TLB-BMP (Basic Metabolic Panel-BMET) (80048-METABOL) TLB-CBC Platelet - w/Differential (85025-CBCD) TLB-A1C / Hgb A1C (Glycohemoglobin) (83036-A1C)  Patient Instructions: 1)  Increase metoprolol to 25mg  three times a day. 2)  Labwork today: bmet/cbc/A1C (414.01;4247.31). 3)  Your physician wants you to follow-up in: 6 months with Dr. Clifton James.  You will receive a reminder letter in the mail two months in advance. If you don't receive a letter, please call our office to schedule the follow-up appointment.

## 2010-04-27 NOTE — Cardiovascular Report (Signed)
Summary: TTM   TTM   Imported By: Roderic Ovens 10/16/2009 09:18:37  _____________________________________________________________________  External Attachment:    Type:   Image     Comment:   External Document

## 2010-04-27 NOTE — Assessment & Plan Note (Signed)
Summary: dysphagia, weight loss/pl     [name] [name] pt    History of Present Illness Visit Type: Follow-up Visit Primary GI MD: Rob Bunting MD Primary Provider: Benedetto Goad, MD Chief Complaint: dysphagia & weight loss x 6 months History of Present Illness:   77 Y.O. MALE KNOWN TO DR. [name] WITH HX OF GERD AND ESOPHAGEAL STRICTURE. HE LAST HAD EGD IN 5/10;GE JUNCTION STRICTURE DILATED TO . HE HAS MULTIPLE OTHER MEDICAL PROBLEMS WITH CAD,ISCHEMIC CARDIOMYOPATHY,CHF,ATRIAL FIB WITH PACEMAKER,DM,DEPRESSION,C.R.I.  HE MOVED INTO AN INDEPENDENT LIVING FACILITY IN NOVEMBER AND ACCORDING TO HIS DAUGHTER WHO ACCOMPANIES HIM-HE HAS NOT BEEN HAPPY SINCE,HAS LOST HIS APPETITE AND HAS LOST 20 POUNDS. HE SAYS HE JUST IS NOT HUNGRY. HE HAD A RECENT SPELL WITH NAUSEA BECAUSE OF INCREASED DOSES OF LASIX AND KCL-NOW RESOLVED, NO VOMITING HE HAS NO C/O ABDOMINAL PAIN,NO CHANGE IN BOWEL HABITS. HE DENIES HEARTBURN,INDIGESTION. NO DYSPHAGIA TO LIQUIDS,DOES OK WITH SOLIDS IF CHEWS WELL AND CHOPS UP MEAT. HE OCCASIONALLY HAS SOME DYSPHAGIA, NO REGURGITATION. HE HAS AN APPT WITH DR. Andrey Campanile LATER THIS WEEK TO DISCUSS DEPRESSION ISSUES/MEDS.   GI Review of Systems    Reports dysphagia with liquids, dysphagia with solids, loss of appetite, nausea, and  weight loss.   Weight loss of 20 pounds over 2 MONTHS.   Denies abdominal pain, acid reflux, belching, bloating, chest pain, heartburn, vomiting, vomiting blood, and  weight gain.        Denies anal fissure, black tarry stools, change in bowel habit, constipation, diarrhea, diverticulosis, fecal incontinence, heme positive stool, hemorrhoids, irritable bowel syndrome, jaundice, light color stool, liver problems, rectal bleeding, and  rectal pain.    Current Medications (verified): 1)  Aspirin Adult Low Strength 81 Mg Tbec (Aspirin) .... Take One Tablet By Mouth Once Daily. 2)  Digoxin 0.125 Mg Tabs (Digoxin) .... Take Three Tablets By Mouth Once Daily. 3)  Effexor Xr  150 Mg Xr24h-Cap (Venlafaxine Hcl) .... Take 1 Daily 4)  Lipitor 40 Mg Tabs (Atorvastatin Calcium) .... Take 1 Daily 5)  Metformin Hcl 500 Mg Tabs (Metformin Hcl) .... Take 1  Tab Two Times A Day 6)  Lumigan 0.03 % Soln (Bimatoprost) .... Uad 7)  Nexium 40 Mg Cpdr (Esomeprazole Magnesium) .... Take Ons Capsule One Half Hour Before Breakfast and Supper 8)  Fludrocortisone Acetate 0.1 Mg Tabs (Fludrocortisone Acetate) .Marland Kitchen.. 1 Tab Two Times A Day 9)  Nu-Iron 150 Mg Caps (Polysaccharide Iron Complex) .Marland Kitchen.. 1 Tab Once Daily 10)  Azasite 1 % Soln (Azithromycin) .Marland Kitchen.. 1 Drop Each Eye 11)  Metoprolol Tartrate 25 Mg Tabs (Metoprolol Tartrate) .... Take 1 Tablet By Mouth 3 Times Per Day  Allergies (verified): 1)  ! Sulfa 2)  ! Amiodarone Hcl  Past History:  Past Medical History: Current Problems:  S/P Viatron DDD PACEMAKER (ICD-V45.Marland Kitchen01) HYPOTENSION, ORTHOSTATIC (ICD-458.0) HYPERLIPIDEMIA-MIXED (ICD-272.4) ATRIAL FIBRILLATION (ICD-427.31) SICK SINUS/ TACHY-BRADY SYNDROME (ICD-427.81) CAD, NATIVE VESSEL (ICD-414.01) S/P AWMI 2005 Rx Stent LAD Diabetes Type 2 Mild Chronic Renal insufficiency Intolerance to Amiodarone Upper GI hemorrhage 2006-MALLORY WEISS TEAR 1. Paroxysmal atrial fibrillation, controlled on sotalol -intol amio can't take flecainide 2. Chronic Coumadin therapy -borderlije coumadin candidate due to falls 3. Status post dual-mode, dual-pacing, dual-sensing pacemaker     implantation with good pacer function. 4. Coronary artery status post anterior wall myocardial infarction in     2005, treated with a stent to the left anterior descending. 5. Recurrent dizziness and falls. 6. Diabetes. 7. Hyperlipidemia. 8. Mild renal insufficiency.   Past Surgical History:  Reviewed history from 05/14/2008 and no changes required. pacemaker placement  Family History: Reviewed history from 05/14/2008 and no changes required. Family History of Coronary Artery Disease: Brother No FH of Colon  Cancer: Family History of Diabetes: Mother  Social History: Reviewed history from 05/14/2008 and no changes required. Retired -NOW LIVING IN INDEPENDENT LIVING/[place] Widowed  Alcohol Use - no Patient is a former smoker. -stopped 50 years ago Patient gets regular exercise.-walking  Review of Systems       The patient complains of fatigue and hearing problems.  The patient denies allergy/sinus, anemia, anxiety-new, arthritis/joint pain, back pain, blood in urine, breast changes/lumps, change in vision, confusion, cough, coughing up blood, depression-new, fainting, fever, headaches-new, heart murmur, heart rhythm changes, itching, menstrual pain, muscle pains/cramps, night sweats, nosebleeds, pregnancy symptoms, shortness of breath, skin rash, sleeping problems, sore throat, swelling of feet/legs, swollen lymph glands, thirst - excessive , urination - excessive , urination changes/pain, urine leakage, vision changes, and voice change.         ROS OTHERWISE AS IN HPI  Vital Signs:  Patient profile:   77 year old male Height:      70 inches Weight:      132.50 pounds Pulse rate:   60 / minute Pulse rhythm:   irregular BP sitting:   90 / 54  (left arm) Cuff size:   regular  Vitals Entered By: June McMurray CMA Duncan Dull) (June 02, 2009 9:25 AM)  Physical Exam  General:  Well developed, well nourished, no acute distress.,THIN,USING A WALKER Head:  Normocephalic and atraumatic. Eyes:  PERRLA, no icterus. Lungs:  Clear throughout to auscultation. Heart:  Regular rate and rhythm; no murmurs, rubs,  or bruits.PACER LEFT CHEST WALL Abdomen:  SOFT,FLAT,NONTENDER, NO MASS OR HSM,BS+ Rectal:  NOT DONE Extremities:  No clubbing, cyanosis, edema or deformities noted. Neurologic:  Alert and  oriented x4;  grossly normal neurologically. Psych:  Alert and cooperative. Normal mood and affect.,FLAT AFFECT   Impression & Recommendations:  Problem # 1:  WEIGHT LOSS-ABNORMAL  (ZOX-096.04) Assessment New 77 Y.O. MALE WITH RECENT WT LOSS OF 20 POUNDS OVER 2-3 MONTHS. HE HAS A HX OF ESOPHAGEAL STRICTURE BUT CURRENT SXS DO NOT APPEAR TO BE RELATED TO HIS STRICTURE. HE HAS ANOREXIA MOST LIKELY DUE TO UNDERLYING DEPRESSION AND CHANGE IN LIVING SITUATION /MULTIPLE MEDICAL PROBLEMS.  NO EGD INDICATED AT THIS TIME,HE DOES NOT FEEL HE IS HAVING SIGNIFICANT DIFFICULTY KEEP FOLLOW UP WITH DR. Andrey Campanile THIS WEEK ADD CARNATION  INSTANT  BREAKFAST OR ENSURE AS SHAKES, AND MIX WITH A HIGHER CALORIE PROTEIN POWDER,TWICE DAILY FOLLOW UP WITH DR. [name] IN ONE MONTH RECENT LABS REVIEWED.  Problem # 2:  DEPRESSION (ICD-311) Assessment: Comment Only  Problem # 3:  DIABETES MELLITUS-TYPE II (ICD-250.00) Assessment: Comment Only  Problem # 4:  CORONARY ARTERY DISEASE (ICD-414.00) Assessment: Comment Only  Problem # 5:  SYSTOLIC HEART FAILURE, CHRONIC (ICD-428.22) Assessment: Comment Only  Problem # 6:  PACEMAKER (ICD-V45.Marland Kitchen01) Assessment: Comment Only  Problem # 7:  ATRIAL FIBRILLATION (ICD-427.31) Assessment: Comment Only  Patient Instructions: 1)  Continue the Nexium twice daily. 2)  Eat chopped meats. 3)  Drink milkshakes or Carnation Instant Breakfast, added Protein powder twice daily. 4)  [employer] has some good protein powders. Don't use low calorie.  5)  We made you an appt with Dr. [name]  for 07-07-09.  6)  The medication list was reviewed and reconciled.  All changed / newly prescribed medications were explained.  A complete medication  list was provided to the patient / caregiver.

## 2010-04-27 NOTE — Assessment & Plan Note (Signed)
Summary: F2M  Medications Added MAG-OXIDE 400 MG TABS (MAGNESIUM OXIDE) 1 tab once daily      Allergies Added:   Visit Type:  Follow-up Primary Provider:  Benedetto Goad, MD  CC:  pt feelimg better.  History of Present Illness: Patient is 77 years old and return for management of CAD and atrial fibrillation. He had a remote anterior wall MI treated with a stent to the LAD. He has atrial fibrillation which has become chronic and is now managed with rate control. He is not felt to be a Coumadin candidate due to previous falls. He also has a DDD pacemaker for AV block and he has had a history of systolic CHF with an ejection fraction of 45-50%.  He was here today with his daughter Andrey Campanile. He lives by himself but his daughter lives very close by. He also has help coming in in the morning and noon time to help with meals and to check his blood pressure and sugar.  He has had no chest pain or shortness of breath or palpitations.  Current Medications (verified): 1)  Aspirin Adult Low Strength 81 Mg Tbec (Aspirin) .... Take One Tablet By Mouth Once Daily. 2)  Digoxin 0.125 Mg Tabs (Digoxin) .... Take Three Tablets By Mouth Once Daily. 3)  Effexor Xr 150 Mg Xr24h-Cap (Venlafaxine Hcl) .... Take 1 Daily 4)  Lipitor 40 Mg Tabs (Atorvastatin Calcium) .... Take 1 Daily 5)  Metformin Hcl 500 Mg Tabs (Metformin Hcl) .... Take 1  Tab Two Times A Day 6)  Lumigan 0.03 % Soln (Bimatoprost) .... Uad 7)  Nexium 40 Mg Cpdr (Esomeprazole Magnesium) .Marland Kitchen.. 1 Tab By Mouth Two Times A Day 8)  Fludrocortisone Acetate 0.1 Mg Tabs (Fludrocortisone Acetate) .Marland Kitchen.. 1 Tab Two Times A Day 9)  Azasite 1 % Soln (Azithromycin) .Marland Kitchen.. 1 Drop Each Eye 10)  Metoprolol Tartrate 25 Mg Tabs (Metoprolol Tartrate) .... Take One Tablet By Mouth Twice A Day 11)  Furosemide 40 Mg Tabs (Furosemide) .... 1/2 Tab By Mouth Once Daily 12)  Mag-Oxide 400 Mg Tabs (Magnesium Oxide) .Marland Kitchen.. 1 Tab Once Daily  Allergies (verified): 1)  ! Sulfa 2)  !  Amiodarone Hcl  Past History:  Past Medical History: Reviewed history from 06/02/2009 and no changes required. Current Problems:  S/P Viatron DDD PACEMAKER (ICD-V45.Marland Kitchen01) HYPOTENSION, ORTHOSTATIC (ICD-458.0) HYPERLIPIDEMIA-MIXED (ICD-272.4) ATRIAL FIBRILLATION (ICD-427.31) SICK SINUS/ TACHY-BRADY SYNDROME (ICD-427.81) CAD, NATIVE VESSEL (ICD-414.01) S/P AWMI 2005 Rx Stent LAD Diabetes Type 2 Mild Chronic Renal insufficiency Intolerance to Amiodarone Upper GI hemorrhage 2006-[facility] WEISS TEAR 1. Paroxysmal atrial fibrillation, controlled on sotalol -intol amio can't take flecainide 2. Chronic Coumadin therapy -borderlije coumadin candidate due to falls 3. Status post dual-mode, dual-pacing, dual-sensing pacemaker     implantation with good pacer function. 4. Coronary artery status post anterior wall myocardial infarction in     2005, treated with a stent to the left anterior descending. 5. Recurrent dizziness and falls. 6. Diabetes. 7. Hyperlipidemia. 8. Mild renal insufficiency.   Review of Systems       ROS is negative except as outlined in HPI.   Vital Signs:  Patient profile:   77 year old male Height:      70 inches Weight:      142 pounds BMI:     20.45 Pulse rate:   59 / minute BP sitting:   112 / 63  Vitals Entered By: Burnett Kanaris, CNA (Aug 07, 2009 10:05 AM)  Physical Exam  Additional Exam:  Gen. Well-nourished,  in no distress   Neck: No JVD, thyroid not enlarged, no carotid bruits Lungs: No tachypnea, clear without rales, rhonchi or wheezes Cardiovascular: Rhythm irregular, PMI not displaced,  heart sounds  normal, no murmurs or gallops, no peripheral edema, pulses normal in all 4 extremities. Abdomen: BS normal, abdomen soft and non-tender without masses or organomegaly, no hepatosplenomegaly. MS: No deformities, no cyanosis or clubbing   Neuro:  No focal sns   Skin:  no lesions    PPM Specifications Following MD:  Everardo Beals. Juanda Chance, MD     PPM  Vendor:  [employer]     PPM Model Number:  [id number]     PPM Serial Number:  [id number] PPM DOI:  02/15/2005     PPM Implanting MD:  Everardo Beals. Juanda Chance, MD  Lead 1    Location: [facility]     DOI: 02/15/2005     Model #: [id number]     Serial #: [id number]     Status: active Lead 2    Location: [facility]     DOI: 02/15/2005     Model #: [id number]     Serial #: [id number]     Status: active  Magnet Response Rate:  BOL 100 ERI   86  Indications:  Symptomatic brady  Explantation Comments:  TTM'S with [employer]  PPM Follow Up Pacer Dependent:  No      Episodes Coumadin:  No  Parameters Mode:  DDDR     Lower Rate Limit:  60     Upper Rate Limit:  140 Paced AV Delay:  275     Sensed AV Delay:  235  Impression & Recommendations:  Problem # 1:  CORONARY ARTERY DISEASE (ICD-414.00) He had a remote anterior MI treated with a stent to the LAD. He's had no chest pain this problem appears stable. His updated medication list for this problem includes:    Aspirin Adult Low Strength 81 Mg Tbec (Aspirin) .Marland Kitchen... Take one tablet by mouth once daily.    Metoprolol Tartrate 25 Mg Tabs (Metoprolol tartrate) .Marland Kitchen... Take one tablet by mouth twice a day  Problem # 2:  ATRIAL FIBRILLATION (ICD-427.31) He has atrial fibrillation which has now become chronic. We had some difficulty with rate control but on interrogation of his pacemaker last time we felt the rate control was pretty good. He is not on Coumadin because of falls and is not felt to be a Coumadin candidate. This problem appears stable. His updated medication list for this problem includes:    Aspirin Adult Low Strength 81 Mg Tbec (Aspirin) .Marland Kitchen... Take one tablet by mouth once daily.    Digoxin 0.125 Mg Tabs (Digoxin) .Marland Kitchen... Take three tablets by mouth once daily.    Metoprolol Tartrate 25 Mg Tabs (Metoprolol tartrate) .Marland Kitchen... Take one tablet by mouth twice a day  His updated medication list for this problem includes:    Aspirin Adult Low Strength 81 Mg Tbec (Aspirin) .Marland Kitchen... Take one  tablet by mouth once daily.    Digoxin 0.125 Mg Tabs (Digoxin) .Marland Kitchen... Take three tablets by mouth once daily.    Metoprolol Tartrate 25 Mg Tabs (Metoprolol tartrate) .Marland Kitchen... Take one tablet by mouth twice a day  Problem # 3:  SYSTOLIC HEART FAILURE, CHRONIC (ICD-428.22) He has a history of systolic heart failure with an ejection fraction of 45-50%. He appears euvolemic today. This problem appears stable. His updated medication list for this problem includes:    Aspirin Adult Low Strength 81 Mg Tbec (Aspirin) .Marland Kitchen... Take  one tablet by mouth once daily.    Digoxin 0.125 Mg Tabs (Digoxin) .Marland Kitchen... Take three tablets by mouth once daily.    Metoprolol Tartrate 25 Mg Tabs (Metoprolol tartrate) .Marland Kitchen... Take one tablet by mouth twice a day    Furosemide 40 Mg Tabs (Furosemide) .Marland Kitchen... 1/2 tab by mouth once daily  Patient Instructions: 1)  Your physician recommends that you schedule a follow-up appointment in: 4 months. 2)  Your physician recommends that you continue on your current medications as directed. Please refer to the Current Medication list given to you today.

## 2010-04-27 NOTE — Progress Notes (Signed)
Summary: bp concerns   Phone Note Call from Patient Call back at [phone]   Caller: Daughter/Sandra Reason for Call: Talk to Nurse Summary of Call: has some questions about bp readings.... 6/8 126/100, 6/9 168/91, 6/10 109/63 Initial call taken by: Migdalia Dk,  September 04, 2009 9:51 AM  Follow-up for Phone Call        concerned about pulse  100 - 91 yesterday and he felt blah.  today HR in the 60's and pt feels better.  Discussed with daughter the normal ranges for HR between 60 and 100.  If HR above 100 and pt doesnt feel well we need to know esp since he is in [facility].  Daughter states understanding Follow-up by: Charolotte Capuchin, RN,  September 04, 2009 10:27 AM

## 2010-04-27 NOTE — Assessment & Plan Note (Signed)
Summary: eph/per daughter call pt was in the hosp for fluid retention/lg  Medications Added METFORMIN HCL 500 MG TABS (METFORMIN HCL) Take 1  tab two times a day NU-IRON 150 MG CAPS (POLYSACCHARIDE IRON COMPLEX) 1 tab once daily FUROSEMIDE 40 MG TABS (FUROSEMIDE) take one tab two times a day POTASSIUM CHLORIDE CRYS CR 20 MEQ CR-TABS (POTASSIUM CHLORIDE CRYS CR) Take one tablet by mouth daily AZASITE 1 % SOLN (AZITHROMYCIN) 1 drop each eye METOPROLOL TARTRATE 25 MG TABS (METOPROLOL TARTRATE) TAKE 1 TABLET BY MOUTH 3 TIMES PER DAY      Allergies Added:   Visit Type:  Follow-up Primary Provider:  Benedetto Goad, MD   History of Present Illness: The patient is 77 years old and return for management of CAD CHF and atrial fibrillation. He has a long history of patent versus mitral fibrillation treated with sotalol. He previously was intolerant of amiodarone could not take flexion I do to coronary disease. His atrial fibrillation has become chronic. He also has CAD and had an anterior MI in 2005 treated with a stent to the LAD. He was recently admitted to the hospital with congestive heart failure. His Jackson-Pratt was 45-50%. He was discharged on February 16.  Since his discharge he has been fair. He has been fairly weak but he has been able to walk around the house with the help of a walker. He has been aware of palpitations. He's had no chest pain but has had weakness.  His past history is significant for diabetes, hyperlipidemia, chronic renal insufficiency, and postural hypotension.  Current Medications (verified): 1)  Aspirin Adult Low Strength 81 Mg Tbec (Aspirin) .... Take One Tablet By Mouth Once Daily. 2)  Digoxin 0.125 Mg Tabs (Digoxin) .... Take Three Tablets By Mouth Once Daily. 3)  Effexor Xr 150 Mg Xr24h-Cap (Venlafaxine Hcl) .... Take 1 Daily 4)  Lipitor 40 Mg Tabs (Atorvastatin Calcium) .... Take 1 Daily 5)  Metformin Hcl 500 Mg Tabs (Metformin Hcl) .... Take 1  Tab Two Times A  Day 6)  Lumigan 0.03 % Soln (Bimatoprost) .... Uad 7)  Nexium 40 Mg Cpdr (Esomeprazole Magnesium) .... Take Ons Capsule One Half Hour Before Breakfast and Supper 8)  Fludrocortisone Acetate 0.1 Mg Tabs (Fludrocortisone Acetate) .Marland Kitchen.. 1 Tab Two Times A Day 9)  Nu-Iron 150 Mg Caps (Polysaccharide Iron Complex) .Marland Kitchen.. 1 Tab Once Daily 10)  Furosemide 40 Mg Tabs (Furosemide) .... Take One Tab Two Times A Day 11)  Potassium Chloride Crys Cr 20 Meq Cr-Tabs (Potassium Chloride Crys Cr) .... Take One Tablet By Mouth Daily 12)  Azasite 1 % Soln (Azithromycin) .Marland Kitchen.. 1 Drop Each Eye 13)  Metoprolol Tartrate 25 Mg Tabs (Metoprolol Tartrate) .... Take 1 Tablet By Mouth 3 Times Per Day  Allergies (verified): 1)  ! Sulfa 2)  ! Amiodarone Hcl  Past History:  Past Medical History: Reviewed history from 11/20/2008 and no changes required. Current Problems:  S/P Viatron DDD PACEMAKER (ICD-V45.Marland Kitchen01) HYPOTENSION, ORTHOSTATIC (ICD-458.0) HYPERLIPIDEMIA-MIXED (ICD-272.4) ATRIAL FIBRILLATION (ICD-427.31) SICK SINUS/ TACHY-BRADY SYNDROME (ICD-427.81) CAD, NATIVE VESSEL (ICD-414.01) S/P AWMI 2005 Rx Stent LAD Diabetes Type 2 Mild Chronic Renal insufficiency Intolerance to Amiodarone Upper GI hemorrhage 2006 1. Paroxysmal atrial fibrillation, controlled on sotalol -intol amio can't take flecainide 2. Chronic Coumadin therapy -borderlije coumadin candidate due to falls 3. Status post dual-mode, dual-pacing, dual-sensing pacemaker     implantation with good pacer function. 4. Coronary artery status post anterior wall myocardial infarction in     2005, treated with  a stent to the left anterior descending. 5. Recurrent dizziness and falls. 6. Diabetes. 7. Hyperlipidemia. 8. Mild renal insufficiency.   Review of Systems       ROS is negative except as outlined in HPI.   Vital Signs:  Patient profile:   77 year old male Height:      70 inches Weight:      135 pounds Pulse rate:   100 / minute BP sitting:    115 / 71  (left arm) Cuff size:   regular  Vitals Entered By: Burnett Kanaris, CNA (May 22, 2009 12:12 PM)  Physical Exam  Additional Exam:  Gen. Well-nourished, in no distress   Neck: No JVD, thyroid not enlarged, no carotid bruits Lungs: No tachypnea, clear without rales, rhonchi or wheezes Cardiovascular: Rhythm irregular, PMI not displaced,  heart sounds  normal, no murmurs or gallops, no peripheral edema, pulses normal in all 4 extremities. Abdomen: BS normal, abdomen soft and non-tender without masses or organomegaly, no hepatosplenomegaly. MS: No deformities, no cyanosis or clubbing   Neuro:  No focal sns   Skin:  no lesions    PPM Specifications Following MD:  Everardo Beals. Juanda Chance, MD     PPM Vendor:  [employer]     PPM Model Number:  [id number]     PPM Serial Number:  [id number] PPM DOI:  02/15/2005     PPM Implanting MD:  Everardo Beals. Juanda Chance, MD  Lead 1    Location: [facility]     DOI: 02/15/2005     Model #: [id number]     Serial #: [id number]     Status: active Lead 2    Location: [facility]     DOI: 02/15/2005     Model #: [id number]     Serial #: [id number]     Status: active  Magnet Response Rate:  BOL 100 ERI   86  Indications:  Symptomatic brady  Explantation Comments:  TTM'S with [employer]  PPM Follow Up Remote Check?  No Battery Voltage:  2.75 V     Battery Est. Longevity:  2.5 years     Pacer Dependent:  No       PPM Device Measurements Atrium  Amplitude: 0.9 mV, Impedance: 350/450 ohms,  Right Ventricle  Amplitude: 15 mV, Impedance: 500/600 ohms, Threshold: 1.0 V at 0.4 msec  Episodes Percent Mode Switch:  100%     Coumadin:  No Atrial Pacing:  2%     Ventricular Pacing:  15%  Parameters Mode:  DDDR     Lower Rate Limit:  60     Upper Rate Limit:  140 Paced AV Delay:  275     Sensed AV Delay:  235 Tech Comments:  A-fib with RVR.  Reprogrammed DDIR.   Altha Harm, LPN  May 22, 2009 12:35 PM   Impression & Recommendations:  Problem # 1:  ATRIAL FIBRILLATION (ICD-427.31) His atrial  fibrillation is now chronic. Interrogation of his pacemaker indicates that his rates have been quite fast with a median rate of over 100 and some rates of 150- 160.  His ejection fraction in the hospital was slightly down at 45-50% and this may be related to his fast rates. He is currently on sotalol. I don't think we'll try for rhythm control any longer and we should just plan for rate control. We'll stop the sotalol and put him on Lopressor 25 mg t.i.d. We'll get a digoxin level and if this is low normal we'll increase his dose from  0.125-0.25. We'll also get a renal p The following medications were removed from the medication list:    Sotalol Hcl 80 Mg Tabs (Sotalol hcl) .Marland Kitchen... Take 1/2 tablet by mouth three times a day His updated medication list for this problem includes:    Aspirin Adult Low Strength 81 Mg Tbec (Aspirin) .Marland Kitchen... Take one tablet by mouth once daily.    Digoxin 0.125 Mg Tabs (Digoxin) .Marland Kitchen... Take three tablets by mouth once daily.    Metoprolol Tartrate 25 Mg Tabs (Metoprolol tartrate) .Marland Kitchen... Take 1 tablet by mouth 3 times per day  Problem # 2:  SYSTOLIC HEART FAILURE, CHRONIC (ICD-428.22) He was recently hospitalized with systolic heart failure. He appears euvolemic and fairly well compensated today. We will continue current therapy for this. We will get a BMP. The following medications were removed from the medication list:    Sotalol Hcl 80 Mg Tabs (Sotalol hcl) .Marland Kitchen... Take 1/2 tablet by mouth three times a day His updated medication list for this problem includes:    Aspirin Adult Low Strength 81 Mg Tbec (Aspirin) .Marland Kitchen... Take one tablet by mouth once daily.    Digoxin 0.125 Mg Tabs (Digoxin) .Marland Kitchen... Take three tablets by mouth once daily.    Furosemide 40 Mg Tabs (Furosemide) .Marland Kitchen... Take one tab two times a day    Metoprolol Tartrate 25 Mg Tabs (Metoprolol tartrate) .Marland Kitchen... Take 1 tablet by mouth 3 times per day  Problem # 3:  CAD, NATIVE VESSEL (ICD-414.01) He had an anterior MI in  2005 treated with a stent to the LAD. He's had no recent chest pain and this problem appears stable.The following medications were removed from the medication list:    Sotalol Hcl 80 Mg Tabs (Sotalol hcl) .Marland Kitchen... Take 1/2 tablet by mouth three times a day His updated medication list for this problem includes:    Aspirin Adult Low Strength 81 Mg Tbec (Aspirin) .Marland Kitchen... Take one tablet by mouth once daily.    Metoprolol Tartrate 25 Mg Tabs (Metoprolol tartrate) .Marland Kitchen... Take 1 tablet by mouth 3 times per day  Orders: TLB-CBC Platelet - w/Differential (85025-CBCD) TLB-BMP (Basic Metabolic Panel-BMET) (80048-METABOL) TLB-Digoxin (Lanoxin) (80162-DIG)  Problem # 4:  PACEMAKER (ICD-V45.Marland Kitchen01) He has a DDD pacemaker which is now programmed to DDI. Interrogation indicated that his atrial rates are quite fast. His pacer function is good. If we can't get good rate control with medications we may consider an AV nodal ablation.  Patient Instructions: 1)  Your physician recommends that you schedule a follow-up appointment in: 4 WEEKS 2)  Your physician recommends that you return for lab work in: TODAY--CBC,BMP,DIG LEVEL 3)  Your physician has recommended you make the following change in your medication: PLEASE DISCONTINUE SOTALOL AND START METOPROLOL 25MG  THREE TIMES PER DAY Prescriptions: METOPROLOL TARTRATE 25 MG TABS (METOPROLOL TARTRATE) TAKE 1 TABLET BY MOUTH 3 TIMES PER DAY  #90 x 10   Entered by:   Ledon Snare, RN   Authorized by:   Lenoria Farrier, MD, [facility], [facility]   Signed by:   Ledon Snare, RN on 05/22/2009   Method used:   Electronically to        [facility]  [address] [phone]* (retail)       [address] [place], [place]  [address]       Ph: [phone] or [phone]       Fax: [phone]   RxID:   [id number] [id number]

## 2010-04-27 NOTE — Letter (Signed)
Summary: [facility] Office Note  [facility] Office Note   Imported By: Roderic Ovens 04/17/2009 15:36:34  _____________________________________________________________________  External Attachment:    Type:   Image     Comment:   External Document

## 2010-04-27 NOTE — Cardiovascular Report (Signed)
Summary: TTM   TTM   Imported By: Roderic Ovens 08/20/2009 11:54:21  _____________________________________________________________________  External Attachment:    Type:   Image     Comment:   External Document

## 2010-04-27 NOTE — Progress Notes (Signed)
Summary: returning call   Phone Note Call from Patient Call back at [phone]   Caller: Daughter/Sandra Reason for Call: Talk to Nurse Summary of Call: returning call Initial call taken by: Migdalia Dk,  May 27, 2009 9:37 AM  Follow-up for Phone Call        I attempted to call Dois Davenport back. I left a message of the pt's results on her identified VM with instructions to call back if she has any questions. Follow-up by: Sherri Rad, RN, BSN,  May 27, 2009 10:31 AM

## 2010-04-27 NOTE — Progress Notes (Signed)
Summary: low bp   Phone Note Call from Patient Call back at [phone]   Caller: Daughter Reason for Call: Talk to Nurse Summary of Call: pt bp is low since med switch .Marland Kitchen... sotalol to lopressor BP today was 90/54 Initial call taken by: Migdalia Dk,  June 02, 2009 4:30 PM  Follow-up for Phone Call        I had reviewed yesterday's message with Dr. Juanda Chance regarding the pt's bp readings, per Dr. Juanda Chance, he would like the pt to stay on his metoprolol due to his elevated HR's in the office. I have explained this to the pt's daughter. She has stopped his evening dose since last thursday. Today the pt's morning sbp was and his HR was in the 60s. I explained that she could leave him on the two times a day dosing of metoprolol as long as his HR's are controlled. I have instructed her to c/b should the pt become symptomatic.  Follow-up by: Sherri Rad, RN, BSN,  June 02, 2009 5:22 PM

## 2010-04-27 NOTE — Consult Note (Signed)
Summary: [facility]   Imported By: Roderic Ovens 04/24/2009 13:53:29  _____________________________________________________________________  External Attachment:    Type:   Image     Comment:   External Document

## 2010-04-27 NOTE — Cardiovascular Report (Signed)
Summary: TTM  TTM   Imported By: Roderic Ovens 04/29/2009 15:27:00  _____________________________________________________________________  External Attachment:    Type:   Image     Comment:   External Document

## 2010-04-27 NOTE — Cardiovascular Report (Signed)
Summary: Pacemaker Follow-up Summary  Pacemaker Follow-up Summary   Imported By: Kassie Mends 04/13/2009 11:29:06  _____________________________________________________________________  External Attachment:    Type:   Image     Comment:   External Document

## 2010-04-27 NOTE — Progress Notes (Signed)
Summary: triage   Phone Note Call from Patient Call back at [phone]   Caller: Daughter Dois Davenport Call For: Christella Hartigan Reason for Call: Talk to Nurse Summary of Call: Patient has a lot of problems swallowing and eating, daughter states that he has lost a lot of weight wants to know if he can be seen sooner than first available appt 4-1 Initial call taken by: Tawni Levy,  June 01, 2009 9:11 AM  Follow-up for Phone Call        Dysphagia to solids, coughing when eating.  Has had some nausea.  Lost 20 pounds in about 2 months.  Appt scheduled with Amy 06/02/09.  Pt daughter advised to give small more frequent meals and soft foods, cut small and have pt to chew well. Follow-up by: Chales Abrahams CMA Duncan Dull),  June 01, 2009 10:07 AM

## 2010-04-27 NOTE — Cardiovascular Report (Signed)
Summary: TTM   TTM   Imported By: Roderic Ovens 05/05/2009 16:07:21  _____________________________________________________________________  External Attachment:    Type:   Image     Comment:   External Document

## 2010-04-27 NOTE — Cardiovascular Report (Signed)
Summary: TTM   TTM   Imported By: Roderic Ovens 01/26/2010 15:18:52  _____________________________________________________________________  External Attachment:    Type:   Image     Comment:   External Document

## 2010-04-27 NOTE — Progress Notes (Signed)
Summary: pt has question about meds from hosp  ASK BB   Phone Note Call from Patient Call back at [phone]   Caller: Daughter/Sandra Presnell Reason for Call: Talk to Nurse, Talk to Doctor Summary of Call: pt was in hosp for fluid build up and was released and was told to take b/p meds and daughter wants to talk to Dr. Juanda Chance about it first Initial call taken by: Omer Jack,  May 15, 2009 8:52 AM  Follow-up for Phone Call        WAS IN THE HOSP FOR FLUID RETENTION.  ER MD WANTS TO START PT ON  LISINOPRIL 2.5 MG DAILY.  DAUGHTER DOESN'T FEEL COMFORTABLE STARTING HIM ON THIS WITHOUT CHECKING WITH DR Juanda Chance FIRST.  WOULD LIKE TO KNOW FROM DR Hakan Nudelman IF HE SHOULD START THIS OR NOT. Follow-up by: Charolotte Capuchin, RN,  May 15, 2009 10:10 AM  Additional Follow-up for Phone Call Additional follow up Details #1::        OK to start lisinopril. BB Additional Follow-up by: Lenoria Farrier, MD, [facility],  May 16, 2009 9:55 AM     Appended Document: pt has question about meds from hosp  ASK BB I spoke with the pt's daughter, she is aware that Dr. Juanda Chance stated he could start lisinopril 2.5mg  once daily. Per Dois Davenport, the pt has an appt on 2/25 with Dr. Juanda Chance. She wants to wait until then to start the pt on this.

## 2010-04-27 NOTE — Assessment & Plan Note (Signed)
Summary: f100m  Medications Added NEXIUM 40 MG CPDR (ESOMEPRAZOLE MAGNESIUM) 1 tab by mouth two times a day METOPROLOL TARTRATE 25 MG TABS (METOPROLOL TARTRATE) Take one tablet by mouth twice a day FUROSEMIDE 40 MG TABS (FUROSEMIDE) 1/2 tab by mouth once daily        Primary Provider:  Benedetto Goad, MD  CC:  pt complains of swelling in ankles.  History of Present Illness: [name] [name] is 77 years old and return for management of atrial fibrillation and CHF and CAD. He has a long history of atrial fibrillation which is now chronic. He has a DDD pacemaker in place. He's had a previous anterior wall MI treated with a stent. He has a history of congestive heart failure with an ejection fraction of 45-50%.  We have had some difficulty with control of his ventricular rate and it had trouble titrating his medicines because of a low blood pressure. He had to cut back his Lopressor from 25 t.i.d. to b.i.d. because of low blood pressure. His daughter also thought the Lasix and potassium were making him nauseated and so his primary care physician stop this and then resumed at at one half of a 40 mg tablet daily. His daughter says he has some slight edema for this.  He has been doing reasonably well since his last visit. Yesterday he developed some pain in his right posterior chest which seems to be aggravated by different positions but not breathing.  Current Medications (verified): 1)  Aspirin Adult Low Strength 81 Mg Tbec (Aspirin) .... Take One Tablet By Mouth Once Daily. 2)  Digoxin 0.125 Mg Tabs (Digoxin) .... Take Three Tablets By Mouth Once Daily. 3)  Effexor Xr 150 Mg Xr24h-Cap (Venlafaxine Hcl) .... Take 1 Daily 4)  Lipitor 40 Mg Tabs (Atorvastatin Calcium) .... Take 1 Daily 5)  Metformin Hcl 500 Mg Tabs (Metformin Hcl) .... Take 1  Tab Two Times A Day 6)  Lumigan 0.03 % Soln (Bimatoprost) .... Uad 7)  Nexium 40 Mg Cpdr (Esomeprazole Magnesium) .Marland Kitchen.. 1 Tab By Mouth Two Times A Day 8)   Fludrocortisone Acetate 0.1 Mg Tabs (Fludrocortisone Acetate) .Marland Kitchen.. 1 Tab Two Times A Day 9)  Azasite 1 % Soln (Azithromycin) .Marland Kitchen.. 1 Drop Each Eye 10)  Metoprolol Tartrate 25 Mg Tabs (Metoprolol Tartrate) .... Take One Tablet By Mouth Twice A Day 11)  Furosemide 40 Mg Tabs (Furosemide) .... 1/2 Tab By Mouth Once Daily  Allergies: 1)  ! Sulfa 2)  ! Amiodarone Hcl  Past History:  Past Medical History: Reviewed history from 06/02/2009 and no changes required. Current Problems:  S/P Viatron DDD PACEMAKER (ICD-V45.Marland Kitchen01) HYPOTENSION, ORTHOSTATIC (ICD-458.0) HYPERLIPIDEMIA-MIXED (ICD-272.4) ATRIAL FIBRILLATION (ICD-427.31) SICK SINUS/ TACHY-BRADY SYNDROME (ICD-427.81) CAD, NATIVE VESSEL (ICD-414.01) S/P AWMI 2005 Rx Stent LAD Diabetes Type 2 Mild Chronic Renal insufficiency Intolerance to Amiodarone Upper GI hemorrhage 2006-MALLORY WEISS TEAR 1. Paroxysmal atrial fibrillation, controlled on sotalol -intol amio can't take flecainide 2. Chronic Coumadin therapy -borderlije coumadin candidate due to falls 3. Status post dual-mode, dual-pacing, dual-sensing pacemaker     implantation with good pacer function. 4. Coronary artery status post anterior wall myocardial infarction in     2005, treated with a stent to the left anterior descending. 5. Recurrent dizziness and falls. 6. Diabetes. 7. Hyperlipidemia. 8. Mild renal insufficiency.   Review of Systems       ROS is negative except as outlined in HPI.   Vital Signs:  Patient profile:   77 year old male Height:  70 inches Weight:      142 pounds Pulse rate:   76 / minute Resp:     12 per minute BP sitting:   100 / 60  (left arm)  Vitals Entered By: [name] [name] (June 19, 2009 10:33 AM)  Physical Exam  Additional Exam:  Gen. Well-nourished, in no distress   Neck: No JVD, thyroid not enlarged, no carotid bruits Lungs: No tachypnea, clear without rales, rhonchi or wheezes Cardiovascular: Rhythm regular, PMI not  displaced,  heart sounds  normal, no murmurs or gallops, no peripheral edema, pulses normal in all 4 extremities. Abdomen: BS normal, abdomen soft and non-tender without masses or organomegaly, no hepatosplenomegaly. MS: No deformities, no cyanosis or clubbing   Neuro:  No focal sns   Skin:  no lesions    PPM Specifications Following MD:  Everardo Beals. Juanda Chance, MD     PPM Vendor:  Med[employer]onic     PPM Model Number:  [id number]     PPM Serial Number:  [id number] PPM DOI:  02/15/2005     PPM Implanting MD:  Everardo Beals. Juanda Chance, MD  Lead 1    Location: [facility]     DOI: 02/15/2005     Model #: [id number]     Serial #: [id number]     Status: active Lead 2    Location: [facility]     DOI: 02/15/2005     Model #: [id number]     Serial #: [id number]     Status: active  Magnet Response Rate:  BOL 100 ERI   86  Indications:  Symptomatic brady  Explantation Comments:  TTM'S with [employer]  PPM Follow Up Pacer Dependent:  No      Episodes Coumadin:  No  Parameters Mode:  DDDR     Lower Rate Limit:  60     Upper Rate Limit:  140 Paced AV Delay:  275     Sensed AV Delay:  235  Impression & Recommendations:  Problem # 1:  SYSTOLIC HEART FAILURE, CHRONIC (ICD-428.22) He is on a lower dose of Lasix but I don't detect any definite volume overload. There is no JVD and only trace peripheral edema. Heavy lifting continue him on his current low dose of furosemide 40 mg one half tablet daily. His updated medication list for this problem includes:    Aspirin Adult Low Strength 81 Mg Tbec (Aspirin) .Marland Kitchen... Take one tablet by mouth once daily.    Digoxin 0.125 Mg Tabs (Digoxin) .Marland Kitchen... Take three tablets by mouth once daily.    Metoprolol Tartrate 25 Mg Tabs (Metoprolol tartrate) .Marland Kitchen... Take one tablet by mouth twice a day    Furosemide 40 Mg Tabs (Furosemide) .Marland Kitchen... 1/2 tab by mouth once daily  Problem # 2:  ATRIAL FIBRILLATION (ICD-427.31) We have had some difficulty with rate control and have been limited by low blood pressure. I considered AV nodal  ablation. His rate appears better controlled today on exam and we will interrogate his pacemaker to get an idea of how good his rate control is been over the past several weeks.    We interrogated his pacemaker today and his rate response was reasonable his rates were not fast. His rate response was about equal between his pacemaker and his intrinsic rhythm. He is in atrial fibrillation all the time so we will reprogram him to VVIR mode. He is on 0.125 digoxin 3 tablets daily and has been on this dose since January and we checked a dig level in early  March which was 1.3. His creatinine is 1.3. We will not repeat those today. His updated medication list for this problem includes:    Aspirin Adult Low Strength 81 Mg Tbec (Aspirin) .Marland Kitchen... Take one tablet by mouth once daily.    Digoxin 0.125 Mg Tabs (Digoxin) .Marland Kitchen... Take three tablets by mouth once daily.    Metoprolol Tartrate 25 Mg Tabs (Metoprolol tartrate) .Marland Kitchen... Take one tablet by mouth twice a day  Problem # 3:  CAD, NATIVE VESSEL (ICD-414.01) He has had no angina and this problem appears stable. His updated medication list for this problem includes:    Aspirin Adult Low Strength 81 Mg Tbec (Aspirin) .Marland Kitchen... Take one tablet by mouth once daily.    Metoprolol Tartrate 25 Mg Tabs (Metoprolol tartrate) .Marland Kitchen... Take one tablet by mouth twice a day  Problem # 4:  CHEST PAIN-UNSPECIFIED (ICD-786.50)  He has right posterior back pain which is aggravated by different positions. I think this is probably some sort of musculoskeletal pain. Due to a chest x-ray today. His updated medication list for this problem includes:    Aspirin Adult Low Strength 81 Mg Tbec (Aspirin) .Marland Kitchen... Take one tablet by mouth once daily.    Metoprolol Tartrate 25 Mg Tabs (Metoprolol tartrate) .Marland Kitchen... Take one tablet by mouth twice a day  Orders: T-2 View CXR (71020TC) EKG w/ Interpretation (93000)  Patient Instructions: 1)  Your physician recommends that you schedule a follow-up  appointment in: 8 weeks. 2)  A chest x-ray takes a picture of the organs and structures inside the chest, including the heart, lungs, and blood vessels. This test can show several things, including, whether the heart is enlarged; whether fluid is building up in the lungs; and whether pacemaker / defibrillator leads are still in place.

## 2010-04-27 NOTE — Miscellaneous (Signed)
Summary: dx correction   Clinical Lists Changes  Problems: Changed problem from RENAL INSUFFICIENCY (ICD-588.4) to RENAL INSUFFICIENCY (ICD-588.9)  changed the incorrect dx code to correct dx code Genella Mech  January 22, 2010 12:53 PM

## 2010-04-29 NOTE — Cardiovascular Report (Signed)
Summary: TTM   TTM   Imported By: Roderic Ovens 04/15/2010 16:28:10  _____________________________________________________________________  External Attachment:    Type:   Image     Comment:   External Document

## 2010-04-29 NOTE — Cardiovascular Report (Signed)
Summary: Office Visit   Office Visit   Imported By: Roderic Ovens 03/17/2010 09:20:24  _____________________________________________________________________  External Attachment:    Type:   Image     Comment:   External Document

## 2010-05-01 ENCOUNTER — Encounter: Payer: Self-pay | Admitting: Internal Medicine

## 2010-05-05 NOTE — Miscellaneous (Signed)
Summary: CORRECTED DEVICE INFORMATION  Clinical Lists Changes  Observations: Added new observation of PPM MODL#: [id number] (05/01/2010 70:35) Added new observation of PACEMAKER MD: Hillis Range, MD (05/01/2010 10:57)      PPM Specifications Following MD:  Hillis Range, MD     PPM Vendor:  [employer]     PPM Model Number:  [id number]     PPM Serial Number:  [id number] PPM DOI:  02/15/2005     PPM Implanting MD:  Everardo Beals. Juanda Chance, MD  Lead 1    Location: RA     DOI: 02/15/2005     Model #: [id number]     Serial #: [id number]     Status: active Lead 2    Location: RV     DOI: 02/15/2005     Model #: [id number]     Serial #: [id number]     Status: active  Magnet Response Rate:  BOL 100 ERI   86  Indications:  Symptomatic brady  Explantation Comments:  TTM'S with [employer]  PPM Follow Up Pacer Dependent:  No      Episodes Coumadin:  No  Parameters Mode:  VVIR     Lower Rate Limit:  60     Upper Rate Limit:  120 Paced AV Delay:  275     Sensed AV Delay:  235

## 2010-05-31 ENCOUNTER — Telehealth: Payer: Self-pay | Admitting: Cardiovascular Disease

## 2010-06-03 ENCOUNTER — Telehealth: Payer: Self-pay | Admitting: Gastroenterology

## 2010-06-07 LAB — POCT CARDIAC MARKERS: Troponin i, poc: <0.05 ng/mL (ref 0.00–0.09)

## 2010-06-07 LAB — URINALYSIS, ROUTINE W REFLEX MICROSCOPIC
Glucose, UA: NEGATIVE mg/dL
Nitrite: NEGATIVE
Specific Gravity, Urine: 1.015 (ref 1.005–1.030)
pH: 6 (ref 5.0–8.0)

## 2010-06-07 LAB — COMPREHENSIVE METABOLIC PANEL
ALT: 25 U/L (ref 0–53)
Albumin: 3.2 g/dL — ABNORMAL LOW (ref 3.5–5.2)
Alkaline Phosphatase: 74 U/L (ref 39–117)
BUN: 30 mg/dL — ABNORMAL HIGH (ref 6–23)
Chloride: 105 mEq/L (ref 96–112)
Glucose, Bld: 124 mg/dL — ABNORMAL HIGH (ref 70–99)
Potassium: 4.3 mEq/L (ref 3.5–5.1)
Total Bilirubin: 1.1 mg/dL (ref 0.3–1.2)

## 2010-06-07 LAB — POCT I-STAT, CHEM 8
BUN: 34 mg/dL — ABNORMAL HIGH (ref 6–23)
Calcium, Ion: 1.15 mmol/L (ref 1.12–1.32)
Creatinine, Ser: 1.4 mg/dL (ref 0.4–1.5)
Glucose, Bld: 124 mg/dL — ABNORMAL HIGH (ref 70–99)
TCO2: 29 mmol/L (ref 0–100)

## 2010-06-07 LAB — CBC
Hemoglobin: 10.9 g/dL — ABNORMAL LOW (ref 13.0–17.0)
MCH: 27.3 pg (ref 26.0–34.0)
MCV: 87.5 fL (ref 78.0–100.0)
RBC: 3.99 MIL/uL — ABNORMAL LOW (ref 4.22–5.81)

## 2010-06-07 LAB — DIFFERENTIAL
Basophils Absolute: 0 10*3/uL (ref 0.0–0.1)
Basophils Relative: 0 % (ref 0–1)
Eosinophils Absolute: 0.2 10*3/uL (ref 0.0–0.7)
Monocytes Absolute: 0.6 10*3/uL (ref 0.1–1.0)
Neutro Abs: 4 10*3/uL (ref 1.7–7.7)
Neutrophils Relative %: 63 % (ref 43–77)

## 2010-06-07 LAB — PROTIME-INR: INR: 1.09 (ref 0.00–1.49)

## 2010-06-08 NOTE — Progress Notes (Signed)
Summary: Medication   Phone Note Call from Patient   Caller: Daughter Dois Davenport [phone] Call For: Dr. Christella Hartigan Reason for Call: Refill Medication Summary of Call: Needs a script for Nexium....will pickup prescription [facility] Initial call taken by: Karna Christmas,  June 03, 2010 2:17 PM  Follow-up for Phone Call        pt daughter to pick up pt rx at front desk Follow-up by: Chales Abrahams CMA Duncan Dull),  June 03, 2010 3:20 PM    Prescriptions: NEXIUM 40 MG CPDR (ESOMEPRAZOLE MAGNESIUM) 1 tab by mouth two times a day  #60 x 11   Entered by:   Chales Abrahams CMA (AAMA)   Authorized by:   Rachael Fee MD   Signed by:   Chales Abrahams CMA (AAMA) on 06/03/2010   Method used:   Print then Give to Patient   RxID:   [id number]

## 2010-06-08 NOTE — Progress Notes (Signed)
Summary: pick up at office   Phone Note Refill Request Message from:  Patient on May 31, 2010 10:43 AM  Refills Requested: Medication #1:  DIGOXIN 0.125 MG TABS 2 by mouth daily  Medication #2:  LIPITOR 40 MG TABS Take 1 daily pt dtr will come by the office to pick up rx. - sandra presnell. [phone]   Method Requested: Pick up at Office Initial call taken by: Lorne Skeens,  May 31, 2010 10:43 AM    Prescriptions: LIPITOR 40 MG TABS (ATORVASTATIN CALCIUM) Take 1 daily  #30 x 11   Entered by:   Celestia Khat, CMA   Authorized by:   Verne Carrow, MD   Signed by:   Celestia Khat, CMA on 06/01/2010   Method used:   Print then Give to Patient   RxID:   [id number] DIGOXIN 0.125 MG TABS (DIGOXIN) 2 by mouth daily  #60 x 11   Entered by:   Celestia Khat, CMA   Authorized by:   Verne Carrow, MD   Signed by:   Celestia Khat, CMA on 06/01/2010   Method used:   Print then Give to Patient   RxID:   [id number]  spoke with pt daughter Marylene Buerger, needs to have printed rx's and pick them up to give to the [place].Marland Kitchen Rx for Digoxin 0.125 mg and Lipitor 40 mg printed and placed at front desk to be picked up. pts daughter notified for pick up. [place], [place]  June 01, 2010 12:13 PM

## 2010-06-10 LAB — COMPREHENSIVE METABOLIC PANEL
ALT: 13 U/L (ref 0–53)
AST: 24 U/L (ref 0–37)
Albumin: 3.5 g/dL (ref 3.5–5.2)
CO2: 26 mEq/L (ref 19–32)
Calcium: 8.9 mg/dL (ref 8.4–10.5)
Chloride: 101 mEq/L (ref 96–112)
Creatinine, Ser: 1.5 mg/dL (ref 0.4–1.5)
GFR calc Af Amer: 54 mL/min — ABNORMAL LOW (ref >60)
GFR calc non Af Amer: 44 mL/min — ABNORMAL LOW (ref >60)
Sodium: 137 mEq/L (ref 135–145)
Total Bilirubin: 1.4 mg/dL — ABNORMAL HIGH (ref 0.3–1.2)

## 2010-06-10 LAB — URINALYSIS, ROUTINE W REFLEX MICROSCOPIC
Nitrite: NEGATIVE
Specific Gravity, Urine: 1.013 (ref 1.005–1.030)
Urobilinogen, UA: 1 mg/dL (ref 0.0–1.0)
pH: 6 (ref 5.0–8.0)

## 2010-06-10 LAB — CBC
Hemoglobin: 10.6 g/dL — ABNORMAL LOW (ref 13.0–17.0)
MCH: 29.1 pg (ref 26.0–34.0)
Platelets: 171 10*3/uL (ref 150–400)
RBC: 3.64 MIL/uL — ABNORMAL LOW (ref 4.22–5.81)

## 2010-06-10 LAB — GLUCOSE, CAPILLARY

## 2010-06-12 LAB — BASIC METABOLIC PANEL
BUN: 25 mg/dL — ABNORMAL HIGH (ref 6–23)
CO2: 32 mEq/L (ref 19–32)
Calcium: 8.8 mg/dL (ref 8.4–10.5)
Chloride: 103 mEq/L (ref 96–112)
Creatinine, Ser: 1.2 mg/dL (ref 0.4–1.5)
GFR calc Af Amer: >60 mL/min (ref >60)
Glucose, Bld: 119 mg/dL — ABNORMAL HIGH (ref 70–99)

## 2010-06-12 LAB — URINALYSIS, ROUTINE W REFLEX MICROSCOPIC
Bilirubin Urine: NEGATIVE
Glucose, UA: NEGATIVE mg/dL
Ketones, ur: NEGATIVE mg/dL
Leukocytes, UA: NEGATIVE
Specific Gravity, Urine: 1.016 (ref 1.005–1.030)
pH: 7.5 (ref 5.0–8.0)

## 2010-06-12 LAB — GLUCOSE, CAPILLARY: Glucose-Capillary: 100 mg/dL — ABNORMAL HIGH (ref 70–99)

## 2010-06-12 LAB — CBC
HCT: 33.8 % — ABNORMAL LOW (ref 39.0–52.0)
Hemoglobin: 11.6 g/dL — ABNORMAL LOW (ref 13.0–17.0)
MCH: 29.8 pg (ref 26.0–34.0)
MCHC: 33.8 g/dL (ref 30.0–36.0)
MCV: 88.1 fL (ref 78.0–100.0)
MCV: 88.1 fL (ref 78.0–100.0)
Platelets: 144 10*3/uL — ABNORMAL LOW (ref 150–400)
RBC: 3.86 MIL/uL — ABNORMAL LOW (ref 4.22–5.81)
RDW: 14.5 % (ref 11.5–15.5)
WBC: 6.5 10*3/uL (ref 4.0–10.5)

## 2010-06-12 LAB — POCT I-STAT, CHEM 8
BUN: 31 mg/dL — ABNORMAL HIGH (ref 6–23)
Calcium, Ion: 1.13 mmol/L (ref 1.12–1.32)
Chloride: 102 mEq/L (ref 96–112)
Glucose, Bld: 107 mg/dL — ABNORMAL HIGH (ref 70–99)

## 2010-06-12 LAB — URINE MICROSCOPIC-ADD ON

## 2010-06-12 LAB — CARDIAC PANEL(CRET KIN+CKTOT+MB+TROPI)
CK, MB: 1.1 ng/mL (ref 0.3–4.0)
Relative Index: INVALID (ref 0.0–2.5)
Relative Index: INVALID (ref 0.0–2.5)
Relative Index: INVALID (ref 0.0–2.5)
Troponin I: 0.02 ng/mL (ref 0.00–0.06)

## 2010-06-12 LAB — COMPREHENSIVE METABOLIC PANEL
AST: 20 U/L (ref 0–37)
Albumin: 3.7 g/dL (ref 3.5–5.2)
Calcium: 8.9 mg/dL (ref 8.4–10.5)
Creatinine, Ser: 1.18 mg/dL (ref 0.4–1.5)
Potassium: 3.8 mEq/L (ref 3.5–5.1)
Sodium: 140 mEq/L (ref 135–145)

## 2010-06-12 LAB — DIFFERENTIAL
Eosinophils Relative: 4 % (ref 0–5)
Lymphocytes Relative: 22 % (ref 12–46)
Monocytes Absolute: 0.6 10*3/uL (ref 0.1–1.0)
Monocytes Relative: 9 % (ref 3–12)
Neutro Abs: 4.2 10*3/uL (ref 1.7–7.7)

## 2010-06-13 LAB — GLUCOSE, CAPILLARY
Glucose-Capillary: 106 mg/dL — ABNORMAL HIGH (ref 70–99)
Glucose-Capillary: 107 mg/dL — ABNORMAL HIGH (ref 70–99)
Glucose-Capillary: 108 mg/dL — ABNORMAL HIGH (ref 70–99)
Glucose-Capillary: 110 mg/dL — ABNORMAL HIGH (ref 70–99)
Glucose-Capillary: 116 mg/dL — ABNORMAL HIGH (ref 70–99)
Glucose-Capillary: 118 mg/dL — ABNORMAL HIGH (ref 70–99)
Glucose-Capillary: 134 mg/dL — ABNORMAL HIGH (ref 70–99)
Glucose-Capillary: 135 mg/dL — ABNORMAL HIGH (ref 70–99)
Glucose-Capillary: 140 mg/dL — ABNORMAL HIGH (ref 70–99)
Glucose-Capillary: 144 mg/dL — ABNORMAL HIGH (ref 70–99)
Glucose-Capillary: 144 mg/dL — ABNORMAL HIGH (ref 70–99)
Glucose-Capillary: 150 mg/dL — ABNORMAL HIGH (ref 70–99)
Glucose-Capillary: 163 mg/dL — ABNORMAL HIGH (ref 70–99)
Glucose-Capillary: 188 mg/dL — ABNORMAL HIGH (ref 70–99)
Glucose-Capillary: 83 mg/dL (ref 70–99)
Glucose-Capillary: 97 mg/dL (ref 70–99)

## 2010-06-13 LAB — LIPID PANEL
HDL: 42 mg/dL (ref >39)
Total CHOL/HDL Ratio: 2.2 RATIO
Triglycerides: 66 mg/dL (ref <150)
VLDL: 13 mg/dL (ref 0–40)

## 2010-06-13 LAB — COMPREHENSIVE METABOLIC PANEL
ALT: 10 U/L (ref 0–53)
AST: 17 U/L (ref 0–37)
Albumin: 3 g/dL — ABNORMAL LOW (ref 3.5–5.2)
Alkaline Phosphatase: 62 U/L (ref 39–117)
BUN: 37 mg/dL — ABNORMAL HIGH (ref 6–23)
CO2: 24 mEq/L (ref 19–32)
Calcium: 9 mg/dL (ref 8.4–10.5)
Creatinine, Ser: 1.15 mg/dL (ref 0.4–1.5)
GFR calc Af Amer: >60 mL/min (ref >60)
Glucose, Bld: 119 mg/dL — ABNORMAL HIGH (ref 70–99)
Total Bilirubin: 1 mg/dL (ref 0.3–1.2)
Total Protein: 6.3 g/dL (ref 6.0–8.3)

## 2010-06-13 LAB — BASIC METABOLIC PANEL
CO2: 28 mEq/L (ref 19–32)
Calcium: 8.4 mg/dL (ref 8.4–10.5)
Calcium: 8.5 mg/dL (ref 8.4–10.5)
Creatinine, Ser: 0.89 mg/dL (ref 0.4–1.5)
Creatinine, Ser: 1.02 mg/dL (ref 0.4–1.5)
GFR calc Af Amer: >60 mL/min (ref >60)
GFR calc Af Amer: >60 mL/min (ref >60)
GFR calc non Af Amer: >60 mL/min (ref >60)
GFR calc non Af Amer: >60 mL/min (ref >60)
Potassium: 4.7 mEq/L (ref 3.5–5.1)
Sodium: 139 mEq/L (ref 135–145)

## 2010-06-13 LAB — DIFFERENTIAL
Basophils Absolute: 0 10*3/uL (ref 0.0–0.1)
Basophils Relative: 0 % (ref 0–1)
Eosinophils Absolute: 0.2 10*3/uL (ref 0.0–0.7)
Eosinophils Relative: 3 % (ref 0–5)
Eosinophils Relative: 4 % (ref 0–5)
Lymphocytes Relative: 26 % (ref 12–46)
Lymphs Abs: 1.7 10*3/uL (ref 0.7–4.0)
Monocytes Absolute: 0.5 10*3/uL (ref 0.1–1.0)
Monocytes Absolute: 0.6 10*3/uL (ref 0.1–1.0)
Monocytes Relative: 10 % (ref 3–12)
Monocytes Relative: 9 % (ref 3–12)
Neutro Abs: 3.6 10*3/uL (ref 1.7–7.7)

## 2010-06-13 LAB — POCT CARDIAC MARKERS: Troponin i, poc: <0.05 ng/mL (ref 0.00–0.09)

## 2010-06-13 LAB — URINALYSIS, ROUTINE W REFLEX MICROSCOPIC
Ketones, ur: NEGATIVE mg/dL
Nitrite: NEGATIVE
pH: 5.5 (ref 5.0–8.0)

## 2010-06-13 LAB — PROTIME-INR
INR: 1.19 (ref 0.00–1.49)
Prothrombin Time: 14.6 seconds (ref 11.6–15.2)
Prothrombin Time: 15 seconds (ref 11.6–15.2)

## 2010-06-13 LAB — DIGOXIN LEVEL: Digoxin Level: 0.6 ng/mL — ABNORMAL LOW (ref 0.8–2.0)

## 2010-06-13 LAB — CBC
HCT: 34.7 % — ABNORMAL LOW (ref 39.0–52.0)
HCT: 38.7 % — ABNORMAL LOW (ref 39.0–52.0)
Hemoglobin: 11.2 g/dL — ABNORMAL LOW (ref 13.0–17.0)
Hemoglobin: 12.4 g/dL — ABNORMAL LOW (ref 13.0–17.0)
MCHC: 32 g/dL (ref 30.0–36.0)
MCV: 84.3 fL (ref 78.0–100.0)
RBC: 4.59 MIL/uL (ref 4.22–5.81)
WBC: 6.4 10*3/uL (ref 4.0–10.5)

## 2010-06-16 LAB — CBC
HCT: 33.3 % — ABNORMAL LOW (ref 39.0–52.0)
HCT: 32.6 % — ABNORMAL LOW (ref 39.0–52.0)
HCT: 33.3 % — ABNORMAL LOW (ref 39.0–52.0)
HCT: 34.8 % — ABNORMAL LOW (ref 39.0–52.0)
Hemoglobin: 10.8 g/dL — ABNORMAL LOW (ref 13.0–17.0)
Hemoglobin: 9.9 g/dL — ABNORMAL LOW (ref 13.0–17.0)
Hemoglobin: 10.7 g/dL — ABNORMAL LOW (ref 13.0–17.0)
Hemoglobin: 11 g/dL — ABNORMAL LOW (ref 13.0–17.0)
Hemoglobin: 11.6 g/dL — ABNORMAL LOW (ref 13.0–17.0)
MCHC: 32.2 g/dL (ref 30.0–36.0)
MCHC: 32.9 g/dL (ref 30.0–36.0)
MCHC: 33.1 g/dL (ref 30.0–36.0)
MCHC: 33.2 g/dL (ref 30.0–36.0)
MCV: 84.5 fL (ref 78.0–100.0)
MCV: 82.2 fL (ref 78.0–100.0)
MCV: 82.7 fL (ref 78.0–100.0)
MCV: 82.8 fL (ref 78.0–100.0)
Platelets: 177 10*3/uL (ref 150–400)
Platelets: 215 10*3/uL (ref 150–400)
Platelets: 223 K/uL (ref 150–400)
Platelets: 225 K/uL (ref 150–400)
Platelets: 237 K/uL (ref 150–400)
RBC: 3.59 MIL/uL — ABNORMAL LOW (ref 4.22–5.81)
RBC: 3.95 MIL/uL — ABNORMAL LOW (ref 4.22–5.81)
RBC: 4.17 MIL/uL — ABNORMAL LOW (ref 4.22–5.81)
RBC: 3.94 MIL/uL — ABNORMAL LOW (ref 4.22–5.81)
RBC: 4.03 MIL/uL — ABNORMAL LOW (ref 4.22–5.81)
RBC: 4.23 MIL/uL (ref 4.22–5.81)
RDW: 16.8 % — ABNORMAL HIGH (ref 11.5–15.5)
RDW: 17.1 % — ABNORMAL HIGH (ref 11.5–15.5)
RDW: 17.4 % — ABNORMAL HIGH (ref 11.5–15.5)
RDW: 17.4 % — ABNORMAL HIGH (ref 11.5–15.5)
WBC: 5.7 10*3/uL (ref 4.0–10.5)
WBC: 5.9 10*3/uL (ref 4.0–10.5)
WBC: 6.7 10*3/uL (ref 4.0–10.5)
WBC: 5.4 10*3/uL (ref 4.0–10.5)
WBC: 6.1 K/uL (ref 4.0–10.5)
WBC: 6.2 K/uL (ref 4.0–10.5)
WBC: 6.2 K/uL (ref 4.0–10.5)

## 2010-06-16 LAB — BASIC METABOLIC PANEL WITH GFR
BUN: 17 mg/dL (ref 6–23)
BUN: 20 mg/dL (ref 6–23)
CO2: 29 meq/L (ref 19–32)
CO2: 31 meq/L (ref 19–32)
Calcium: 8 mg/dL — ABNORMAL LOW (ref 8.4–10.5)
Calcium: 8.1 mg/dL — ABNORMAL LOW (ref 8.4–10.5)
Chloride: 103 meq/L (ref 96–112)
Chloride: 103 meq/L (ref 96–112)
Creatinine, Ser: 1 mg/dL (ref 0.4–1.5)
Creatinine, Ser: 1.16 mg/dL (ref 0.4–1.5)
GFR calc non Af Amer: 60 mL/min — ABNORMAL LOW
GFR calc non Af Amer: >60 mL/min
Glucose, Bld: 106 mg/dL — ABNORMAL HIGH (ref 70–99)
Glucose, Bld: 108 mg/dL — ABNORMAL HIGH (ref 70–99)
Potassium: 3.9 meq/L (ref 3.5–5.1)
Potassium: 4 meq/L (ref 3.5–5.1)
Sodium: 139 meq/L (ref 135–145)
Sodium: 140 meq/L (ref 135–145)

## 2010-06-16 LAB — COMPREHENSIVE METABOLIC PANEL
ALT: 11 U/L (ref 0–53)
AST: 23 U/L (ref 0–37)
Albumin: 3.2 g/dL — ABNORMAL LOW (ref 3.5–5.2)
Alkaline Phosphatase: 60 U/L (ref 39–117)
Alkaline Phosphatase: 76 U/L (ref 39–117)
BUN: 29 mg/dL — ABNORMAL HIGH (ref 6–23)
CO2: 31 mEq/L (ref 19–32)
Chloride: 107 mEq/L (ref 96–112)
GFR calc Af Amer: >60 mL/min (ref >60)
GFR calc non Af Amer: 53 mL/min — ABNORMAL LOW (ref >60)
Glucose, Bld: 87 mg/dL (ref 70–99)
Potassium: 3.3 mEq/L — ABNORMAL LOW (ref 3.5–5.1)
Potassium: 4.8 mEq/L (ref 3.5–5.1)
Sodium: 142 mEq/L (ref 135–145)
Total Bilirubin: 0.8 mg/dL (ref 0.3–1.2)
Total Protein: 6.7 g/dL (ref 6.0–8.3)

## 2010-06-16 LAB — URINALYSIS, ROUTINE W REFLEX MICROSCOPIC
Bilirubin Urine: NEGATIVE
Glucose, UA: NEGATIVE mg/dL
Hgb urine dipstick: NEGATIVE
Ketones, ur: NEGATIVE mg/dL
Nitrite: NEGATIVE
Protein, ur: NEGATIVE mg/dL
Specific Gravity, Urine: 1.025 (ref 1.005–1.030)
pH: 6.5 (ref 5.0–8.0)

## 2010-06-16 LAB — DIFFERENTIAL
Basophils Absolute: 0 10*3/uL (ref 0.0–0.1)
Basophils Relative: 0 % (ref 0–1)
Basophils Relative: 0 % (ref 0–1)
Eosinophils Absolute: 0.1 10*3/uL (ref 0.0–0.7)
Eosinophils Relative: 2 % (ref 0–5)
Eosinophils Relative: 3 % (ref 0–5)
Lymphocytes Relative: 23 % (ref 12–46)
Monocytes Absolute: 0.6 10*3/uL (ref 0.1–1.0)
Monocytes Relative: 13 % — ABNORMAL HIGH (ref 3–12)
Monocytes Relative: 12 % (ref 3–12)
Neutro Abs: 3.4 10*3/uL (ref 1.7–7.7)
Neutrophils Relative %: 63 % (ref 43–77)

## 2010-06-16 LAB — CARDIAC PANEL(CRET KIN+CKTOT+MB+TROPI)
CK, MB: 1.2 ng/mL (ref 0.3–4.0)
CK, MB: 1.3 ng/mL (ref 0.3–4.0)
Relative Index: 1 (ref 0.0–2.5)
Relative Index: 1.1 (ref 0.0–2.5)
Relative Index: INVALID (ref 0.0–2.5)
Relative Index: INVALID (ref 0.0–2.5)
Total CK: 135 U/L (ref 7–232)
Total CK: 66 U/L (ref 7–232)
Total CK: 74 U/L (ref 7–232)
Total CK: 75 U/L (ref 7–232)
Troponin I: 0.05 ng/mL (ref 0.00–0.06)
Troponin I: 0.02 ng/mL (ref 0.00–0.06)

## 2010-06-16 LAB — URINE CULTURE

## 2010-06-16 LAB — D-DIMER, QUANTITATIVE: D-Dimer, Quant: 1.3 ug/mL-FEU — ABNORMAL HIGH (ref 0.00–0.48)

## 2010-06-16 LAB — BASIC METABOLIC PANEL
BUN: 17 mg/dL (ref 6–23)
BUN: 27 mg/dL — ABNORMAL HIGH (ref 6–23)
Calcium: 7.8 mg/dL — ABNORMAL LOW (ref 8.4–10.5)
Chloride: 102 mEq/L (ref 96–112)
Creatinine, Ser: 1.4 mg/dL (ref 0.4–1.5)
GFR calc Af Amer: 58 mL/min — ABNORMAL LOW (ref >60)
GFR calc Af Amer: >60 mL/min (ref >60)
GFR calc non Af Amer: 58 mL/min — ABNORMAL LOW (ref >60)
Potassium: 3.4 mEq/L — ABNORMAL LOW (ref 3.5–5.1)
Sodium: 139 mEq/L (ref 135–145)

## 2010-06-16 LAB — COMPREHENSIVE METABOLIC PANEL WITH GFR
ALT: 21 U/L (ref 0–53)
ALT: 21 U/L (ref 0–53)
AST: 23 U/L (ref 0–37)
AST: 25 U/L (ref 0–37)
Albumin: 3.2 g/dL — ABNORMAL LOW (ref 3.5–5.2)
Albumin: 3.2 g/dL — ABNORMAL LOW (ref 3.5–5.2)
Alkaline Phosphatase: 76 U/L (ref 39–117)
Alkaline Phosphatase: 82 U/L (ref 39–117)
BUN: 23 mg/dL (ref 6–23)
BUN: 24 mg/dL — ABNORMAL HIGH (ref 6–23)
CO2: 31 meq/L (ref 19–32)
CO2: 32 meq/L (ref 19–32)
Calcium: 8.2 mg/dL — ABNORMAL LOW (ref 8.4–10.5)
Calcium: 8.3 mg/dL — ABNORMAL LOW (ref 8.4–10.5)
Chloride: 101 meq/L (ref 96–112)
Chloride: 102 meq/L (ref 96–112)
Creatinine, Ser: 1.04 mg/dL (ref 0.4–1.5)
Creatinine, Ser: 1.11 mg/dL (ref 0.4–1.5)
GFR calc non Af Amer: >60 mL/min
GFR calc non Af Amer: >60 mL/min
Glucose, Bld: 113 mg/dL — ABNORMAL HIGH (ref 70–99)
Glucose, Bld: 114 mg/dL — ABNORMAL HIGH (ref 70–99)
Potassium: 2.9 meq/L — ABNORMAL LOW (ref 3.5–5.1)
Potassium: 3.1 meq/L — ABNORMAL LOW (ref 3.5–5.1)
Sodium: 140 meq/L (ref 135–145)
Sodium: 143 meq/L (ref 135–145)
Total Bilirubin: 1.2 mg/dL (ref 0.3–1.2)
Total Bilirubin: 1.3 mg/dL — ABNORMAL HIGH (ref 0.3–1.2)
Total Protein: 6.8 g/dL (ref 6.0–8.3)
Total Protein: 6.9 g/dL (ref 6.0–8.3)

## 2010-06-16 LAB — GLUCOSE, CAPILLARY
Glucose-Capillary: 108 mg/dL — ABNORMAL HIGH (ref 70–99)
Glucose-Capillary: 140 mg/dL — ABNORMAL HIGH (ref 70–99)
Glucose-Capillary: 142 mg/dL — ABNORMAL HIGH (ref 70–99)
Glucose-Capillary: 96 mg/dL (ref 70–99)
Glucose-Capillary: 105 mg/dL — ABNORMAL HIGH (ref 70–99)
Glucose-Capillary: 114 mg/dL — ABNORMAL HIGH (ref 70–99)
Glucose-Capillary: 115 mg/dL — ABNORMAL HIGH (ref 70–99)
Glucose-Capillary: 125 mg/dL — ABNORMAL HIGH (ref 70–99)
Glucose-Capillary: 126 mg/dL — ABNORMAL HIGH (ref 70–99)
Glucose-Capillary: 139 mg/dL — ABNORMAL HIGH (ref 70–99)
Glucose-Capillary: 141 mg/dL — ABNORMAL HIGH (ref 70–99)
Glucose-Capillary: 141 mg/dL — ABNORMAL HIGH (ref 70–99)
Glucose-Capillary: 151 mg/dL — ABNORMAL HIGH (ref 70–99)
Glucose-Capillary: 185 mg/dL — ABNORMAL HIGH (ref 70–99)

## 2010-06-16 LAB — BRAIN NATRIURETIC PEPTIDE
Pro B Natriuretic peptide (BNP): 1013 pg/mL — ABNORMAL HIGH (ref 0.0–100.0)
Pro B Natriuretic peptide (BNP): 572 pg/mL — ABNORMAL HIGH (ref 0.0–100.0)
Pro B Natriuretic peptide (BNP): 852 pg/mL — ABNORMAL HIGH (ref 0.0–100.0)
Pro B Natriuretic peptide (BNP): 967 pg/mL — ABNORMAL HIGH (ref 0.0–100.0)

## 2010-06-16 LAB — POCT CARDIAC MARKERS
CKMB, poc: 1.3 ng/mL (ref 1.0–8.0)
CKMB, poc: 1.4 ng/mL (ref 1.0–8.0)
Myoglobin, poc: 108 ng/mL (ref 12–200)
Troponin i, poc: <0.05 ng/mL (ref 0.00–0.09)
Troponin i, poc: <0.05 ng/mL (ref 0.00–0.09)

## 2010-06-16 LAB — DIGOXIN LEVEL: Digoxin Level: 1.7 ng/mL (ref 0.8–2.0)

## 2010-06-16 LAB — FOLATE RBC: RBC Folate: 805 ng/mL — ABNORMAL HIGH (ref 180–600)

## 2010-06-16 LAB — HEMOGLOBIN A1C
Hgb A1c MFr Bld: 6.3 % — ABNORMAL HIGH (ref 4.6–6.1)
Mean Plasma Glucose: 148 mg/dL
Mean Plasma Glucose: 134 mg/dL

## 2010-06-16 LAB — VITAMIN B12: Vitamin B-12: 295 pg/mL (ref 211–911)

## 2010-06-16 LAB — TSH: TSH: 1.485 u[IU]/mL (ref 0.350–4.500)

## 2010-06-16 LAB — CK TOTAL AND CKMB (NOT AT ARMC): Total CK: 115 U/L (ref 7–232)

## 2010-06-16 LAB — URINE MICROSCOPIC-ADD ON

## 2010-06-16 LAB — T4, FREE: Free T4: 1.48 ng/dL (ref 0.80–1.80)

## 2010-06-18 ENCOUNTER — Encounter: Payer: Self-pay | Admitting: *Deleted

## 2010-07-01 ENCOUNTER — Ambulatory Visit (INDEPENDENT_AMBULATORY_CARE_PROVIDER_SITE_OTHER): Payer: BC Managed Care – PPO | Admitting: Cardiovascular Disease

## 2010-07-01 ENCOUNTER — Ambulatory Visit: Payer: Self-pay | Admitting: Cardiovascular Disease

## 2010-07-01 ENCOUNTER — Encounter: Payer: Self-pay | Admitting: Cardiovascular Disease

## 2010-07-01 VITALS — BP 110/60 | HR 81 | Ht 71.0 in | Wt 125.0 lb

## 2010-07-01 DIAGNOSIS — I2510 Atherosclerotic heart disease of native coronary artery without angina pectoris: Secondary | ICD-10-CM

## 2010-07-01 DIAGNOSIS — I255 Ischemic cardiomyopathy: Secondary | ICD-10-CM

## 2010-07-01 DIAGNOSIS — R079 Chest pain, unspecified: Secondary | ICD-10-CM

## 2010-07-01 DIAGNOSIS — I4891 Unspecified atrial fibrillation: Secondary | ICD-10-CM

## 2010-07-01 DIAGNOSIS — I2589 Other forms of chronic ischemic heart disease: Secondary | ICD-10-CM

## 2010-07-01 LAB — DIFFERENTIAL
Eosinophils Relative: 3 % (ref 0–5)
Lymphs Abs: 2.1 10*3/uL (ref 0.7–4.0)
Monocytes Absolute: 0.7 10*3/uL (ref 0.1–1.0)
Monocytes Relative: 12 % (ref 3–12)
Neutro Abs: 3 10*3/uL (ref 1.7–7.7)

## 2010-07-01 LAB — PROTIME-INR
INR: 1.09 (ref 0.00–1.49)
Prothrombin Time: 14 seconds (ref 11.6–15.2)

## 2010-07-01 LAB — APTT: aPTT: 26 s (ref 24–37)

## 2010-07-01 LAB — BASIC METABOLIC PANEL
BUN: 35 mg/dL — ABNORMAL HIGH (ref 6–23)
CO2: 25 mEq/L (ref 19–32)
Chloride: 105 mEq/L (ref 96–112)
Glucose, Bld: 139 mg/dL — ABNORMAL HIGH (ref 70–99)
Potassium: 4.5 mEq/L (ref 3.5–5.1)

## 2010-07-01 LAB — CBC
HCT: 37 % — ABNORMAL LOW (ref 39.0–52.0)
Hemoglobin: 12.4 g/dL — ABNORMAL LOW (ref 13.0–17.0)
MCV: 84.3 fL (ref 78.0–100.0)
RDW: 21.1 % — ABNORMAL HIGH (ref 11.5–15.5)
WBC: 6.1 10*3/uL (ref 4.0–10.5)

## 2010-07-01 LAB — URINE CULTURE
Colony Count: NO GROWTH
Culture: NO GROWTH

## 2010-07-01 LAB — DIGOXIN LEVEL: Digoxin Level: 1 ng/mL (ref 0.8–2.0)

## 2010-07-01 LAB — URINALYSIS, ROUTINE W REFLEX MICROSCOPIC
Ketones, ur: 40 mg/dL — AB
Nitrite: NEGATIVE
Protein, ur: NEGATIVE mg/dL
pH: 5.5 (ref 5.0–8.0)

## 2010-07-01 LAB — BASIC METABOLIC PANEL WITH GFR
Calcium: 9.2 mg/dL (ref 8.4–10.5)
Creatinine, Ser: 1.84 mg/dL — ABNORMAL HIGH (ref 0.4–1.5)
GFR calc Af Amer: 43 mL/min — ABNORMAL LOW (ref >60)
GFR calc non Af Amer: 35 mL/min — ABNORMAL LOW (ref >60)
Sodium: 140 meq/L (ref 135–145)

## 2010-07-01 LAB — POCT I-STAT, CHEM 8
Calcium, Ion: 1.12 mmol/L (ref 1.12–1.32)
HCT: 37 % — ABNORMAL LOW (ref 39.0–52.0)
Hemoglobin: 12.6 g/dL — ABNORMAL LOW (ref 13.0–17.0)
Sodium: 138 mEq/L (ref 135–145)
TCO2: 24 mmol/L (ref 0–100)

## 2010-07-01 LAB — BRAIN NATRIURETIC PEPTIDE: Pro B Natriuretic peptide (BNP): 300 pg/mL — ABNORMAL HIGH (ref 0.0–100.0)

## 2010-07-01 LAB — POCT CARDIAC MARKERS

## 2010-07-01 LAB — CULTURE, BLOOD (ROUTINE X 2): Culture: NO GROWTH

## 2010-07-01 LAB — HEMOCCULT GUIAC POC 1CARD (OFFICE): Fecal Occult Bld: NEGATIVE

## 2010-07-01 NOTE — Assessment & Plan Note (Signed)
Rate controlled on metoprolol. Not a coumadin candidate. Follow up in device clinic with Dr. Johney Frame this spring.

## 2010-07-01 NOTE — Progress Notes (Signed)
History of Present Illness: [name] with history of CAD, systolic CHF, atrial fibrillation here for cardiac follow up. He has been followed in the past by Dr. Juanda [name]. He had an anterior MI 2005 treated with a stent to the LAD. He has chronic atrial fibrillation which is being managed with rate control with metoprolol. He had been on coumadin but this was stopped last year secondary to his fall and bleeding risk.  He has a pacemaker. His device has been followed by Dr. Juanda [name] here in the office with remote monitoring by Dr. Ladona [name]. He also has a history of systolic CHF with an ejection fraction of 45-50%. In July 2011, he suffered a stroke and was admitted to the hospital. He has severe dementia and resides in an assisted living memory unit facility. His main limitation now is related to memory problems.   No SOB, dizziness, near syncope or syncope. He describes occasional mild chest pains. These pains do not slow him down. Overall doing well. His daughter is with him today and reports that he is doing well.    Past Medical History  Diagnosis Date  . Cardiac pacemaker in situ     s/p Viatron DDD Pacemaker  . Orthostatic hypotension   . Other and unspecified hyperlipidemia     mixed  . Atrial fibrillation   . Sinoatrial node dysfunction   . Coronary atherosclerosis of native coronary artery     s/p AWMI 2005 stent LAD  . Diabetes mellitus, type 2   . Chronic renal insufficiency, stage II (mild)   . Intolerance, drug     amniodarone  . Upper GI hemorrhage     2006 Mallory Weiss Tear  . Paroxysmal atrial fibrillation     controlled on Sotalol -intol amnio cant take flecainide  . Anticoagulated on warfarin     chronic coumadin therapy-borderline coumadin candidate due to falls  . S/P cardiac pacemaker procedure     s/p  dual-mode, dual pacing, dual sensing pacemaker implantation with good pacer function.  . MI (myocardial infarction)     coronary artery status post anterior wall myocardial  infarction in 2005 treated with a stent to the left anterior descending.  . Dizziness     recurrent dizziness and falls  . Diabetes mellitus   . Hyperlipidemia   . Renal insufficiency     mild    Past Surgical History  Procedure Date  . Pacemaker placement   . Coronary stent placement     Current Outpatient Prescriptions  Medication Sig Dispense Refill  . acetaminophen (TYLENOL) 500 MG tablet Take 500 mg by mouth every 6 (six) hours as needed.        Marland Kitchen atorvastatin (LIPITOR) 40 MG tablet Take 40 mg by mouth daily.        . bimatoprost (LUMIGAN) 0.03 % ophthalmic drops Place 1 drop into both eyes at bedtime.        . clopidogrel (PLAVIX) 75 MG tablet Take 75 mg by mouth daily.        Marland Kitchen dextromethorphan-guaiFENesin (MUCINEX DM) 30-600 MG per 12 hr tablet Take 1 tablet by mouth every 12 (twelve) hours.        . digoxin (LANOXIN) 0.125 MG tablet Take 250 mcg by mouth daily.        Marland Kitchen donepezil (ARICEPT) 5 MG tablet Take 5 mg by mouth at bedtime as needed.        Marland Kitchen esomeprazole (NEXIUM) 20 MG capsule Take 40 mg by mouth daily  before breakfast.       . ferrous sulfate 325 (65 FE) MG tablet Take 325 mg by mouth daily with breakfast.        . fludrocortisone (FLORINEF) 0.1 MG tablet Take 0.1 mg by mouth 2 (two) times daily.        . furosemide (LASIX) 40 MG tablet Take 20 mg by mouth daily.        . Glucerna (GLUCERNA) LIQD Take 237 mLs by mouth 3 (three) times daily between meals.        . haloperidol (HALDOL) 0.5 MG tablet Take 0.5 mg by mouth 2 (two) times daily.        Marland Kitchen LORazepam (ATIVAN) 0.5 MG tablet Take 1 mg by mouth as needed.       . magnesium oxide (MAG-OX) 400 MG tablet Take 400 mg by mouth daily.        . metFORMIN (GLUMETZA) 500 MG (MOD) 24 hr tablet Take 500 mg by mouth 2 (two) times daily.        . metoprolol tartrate (LOPRESSOR) 25 MG tablet Take 25 mg by mouth 3 (three) times daily.        Bertram Gala Glycol-Propyl Glycol (SYSTANE) 0.4-0.3 % SOLN Apply 1 drop to eye 4  (four) times daily.        . potassium chloride SA (K-DUR,KLOR-CON) 20 MEQ tablet Take 20 mEq by mouth daily.        Marland Kitchen venlafaxine (EFFEXOR-XR) 150 MG 24 hr capsule Take 150 mg by mouth daily.        Marland Kitchen azithromycin (AZASITE) 1 % ophthalmic solution Place 1 drop into both eyes.          Allergies  Allergen Reactions  . Amiodarone Hcl   . Sulfonamide Derivatives     REACTION: hives    History   Social History  . Marital Status: Widowed    Spouse Name: N/A    Number of Children: N/A  . Years of Education: N/A   Occupational History  . retired     now living in [facility]   Social History Main Topics  . Smoking status: Former Smoker    Quit date: 03/28/1960  . Smokeless tobacco: Not on file  . Alcohol Use: No  . Drug Use: Not on file  . Sexually Active: Not on file   Other Topics Concern  . Not on file   Social History Narrative  . No narrative on file    Family History  Problem Relation Age of Onset  . Coronary artery disease Brother   . Diabetes Mother     Review of Systems:  No chest pain, SOB, palpitations, dizziness,  near syncope or syncope.  No PND, orthopnea, or Lower extremity edema.   BP 110/60  Pulse 81  Ht 5\' 11"  (1.803 m)  Wt 125 lb (56.7 kg)  BMI 17.43 kg/m2  Physical Examination: General: Cachectic, elderly male, NAD HEENT: OP clear, mucus membranes moist SKIN: warm, dry. No rashes. Neuro: No focal deficits Musculoskeletal: Muscle strength 5/5 all ext Psychiatric: Mood and affect normal Neck: No JVD, no carotid bruits, no thyromegaly, no lymphadenopathy. Lungs:Clear bilaterally, no wheezes, rhonci, crackles Cardiovascular: Irregular. Normal rate. Systolic murmur. No  gallops or rubs. Abdomen:Soft. Bowel sounds present. Non-tender.  Extremities: No lower extremity edema. Pulses are 1 + in the bilateral DP/PT.  MWU:XLKGMW fibrillation, demand pacer. Non-specific ST and T wave abnormalities.

## 2010-07-01 NOTE — Patient Instructions (Signed)
Your physician recommends that you schedule a follow-up appointment in 1 year with Dr. Clifton James We would like for you to follow up with Dr. Johney Frame so he can follow your pacemaker device more closely.

## 2010-07-01 NOTE — Assessment & Plan Note (Signed)
Stable No changes 

## 2010-07-01 NOTE — Assessment & Plan Note (Signed)
Volume status ok. No changes. I would hope that no further invasive testing/procedures will be needed in this elderly male with moderate to severe dementia.

## 2010-07-02 LAB — BASIC METABOLIC PANEL
BUN: 17 mg/dL (ref 6–23)
Calcium: 8.7 mg/dL (ref 8.4–10.5)
Calcium: 8.9 mg/dL (ref 8.4–10.5)
Creatinine, Ser: 1.13 mg/dL (ref 0.4–1.5)
GFR calc Af Amer: 57 mL/min — ABNORMAL LOW (ref >60)
GFR calc non Af Amer: 47 mL/min — ABNORMAL LOW (ref >60)
GFR calc non Af Amer: >60 mL/min (ref >60)
Potassium: 4.7 mEq/L (ref 3.5–5.1)
Sodium: 139 mEq/L (ref 135–145)

## 2010-07-02 LAB — CARDIAC PANEL(CRET KIN+CKTOT+MB+TROPI)
CK, MB: 1.2 ng/mL (ref 0.3–4.0)
Relative Index: 0.8 (ref 0.0–2.5)
Relative Index: 1.1 (ref 0.0–2.5)
Total CK: 161 U/L (ref 7–232)

## 2010-07-02 LAB — PROTIME-INR
INR: 2.7 — ABNORMAL HIGH (ref 0.00–1.49)
Prothrombin Time: 28.8 seconds — ABNORMAL HIGH (ref 11.6–15.2)
Prothrombin Time: 31.6 seconds — ABNORMAL HIGH (ref 11.6–15.2)

## 2010-07-02 LAB — URINALYSIS, ROUTINE W REFLEX MICROSCOPIC
Bilirubin Urine: NEGATIVE
Specific Gravity, Urine: 1.011 (ref 1.005–1.030)
Urobilinogen, UA: 0.2 mg/dL (ref 0.0–1.0)
pH: 5.5 (ref 5.0–8.0)

## 2010-07-02 LAB — DIFFERENTIAL
Lymphocytes Relative: 14 % (ref 12–46)
Monocytes Absolute: 0.7 10*3/uL (ref 0.1–1.0)
Monocytes Relative: 9 % (ref 3–12)
Neutro Abs: 6.1 10*3/uL (ref 1.7–7.7)

## 2010-07-02 LAB — HEMOGLOBIN A1C: Mean Plasma Glucose: 143 mg/dL

## 2010-07-02 LAB — COMPREHENSIVE METABOLIC PANEL
ALT: 21 U/L (ref 0–53)
Alkaline Phosphatase: 46 U/L (ref 39–117)
CO2: 24 mEq/L (ref 19–32)
GFR calc non Af Amer: >60 mL/min (ref >60)
Glucose, Bld: 149 mg/dL — ABNORMAL HIGH (ref 70–99)
Potassium: 4.7 mEq/L (ref 3.5–5.1)
Sodium: 138 mEq/L (ref 135–145)
Total Protein: 6.5 g/dL (ref 6.0–8.3)

## 2010-07-02 LAB — GLUCOSE, CAPILLARY
Glucose-Capillary: 122 mg/dL — ABNORMAL HIGH (ref 70–99)
Glucose-Capillary: 123 mg/dL — ABNORMAL HIGH (ref 70–99)
Glucose-Capillary: 169 mg/dL — ABNORMAL HIGH (ref 70–99)

## 2010-07-02 LAB — URINE MICROSCOPIC-ADD ON

## 2010-07-02 LAB — CBC
HCT: 33.7 % — ABNORMAL LOW (ref 39.0–52.0)
Hemoglobin: 11.4 g/dL — ABNORMAL LOW (ref 13.0–17.0)
Hemoglobin: 11.7 g/dL — ABNORMAL LOW (ref 13.0–17.0)
Platelets: 139 10*3/uL — ABNORMAL LOW (ref 150–400)
RBC: 4.33 MIL/uL (ref 4.22–5.81)
RBC: 4.52 MIL/uL (ref 4.22–5.81)
WBC: 6.5 10*3/uL (ref 4.0–10.5)

## 2010-07-02 LAB — CK TOTAL AND CKMB (NOT AT ARMC): Relative Index: 1 (ref 0.0–2.5)

## 2010-07-02 LAB — MAGNESIUM: Magnesium: 1.6 mg/dL (ref 1.5–2.5)

## 2010-07-02 LAB — DIGOXIN LEVEL: Digoxin Level: 0.7 ng/mL — ABNORMAL LOW (ref 0.8–2.0)

## 2010-07-06 LAB — PROTIME-INR: Prothrombin Time: 13.5 seconds (ref 11.6–15.2)

## 2010-07-08 ENCOUNTER — Encounter: Payer: Self-pay | Admitting: Cardiovascular Disease

## 2010-08-05 ENCOUNTER — Ambulatory Visit (INDEPENDENT_AMBULATORY_CARE_PROVIDER_SITE_OTHER): Payer: BC Managed Care – PPO | Admitting: Internal Medicine

## 2010-08-05 ENCOUNTER — Encounter: Payer: Self-pay | Admitting: Internal Medicine

## 2010-08-05 DIAGNOSIS — I2510 Atherosclerotic heart disease of native coronary artery without angina pectoris: Secondary | ICD-10-CM

## 2010-08-05 DIAGNOSIS — I255 Ischemic cardiomyopathy: Secondary | ICD-10-CM

## 2010-08-05 DIAGNOSIS — I495 Sick sinus syndrome: Secondary | ICD-10-CM

## 2010-08-05 DIAGNOSIS — I2589 Other forms of chronic ischemic heart disease: Secondary | ICD-10-CM

## 2010-08-05 DIAGNOSIS — I4891 Unspecified atrial fibrillation: Secondary | ICD-10-CM

## 2010-08-05 NOTE — Assessment & Plan Note (Signed)
Stable without ischemic changes No change required today

## 2010-08-05 NOTE — Assessment & Plan Note (Signed)
Pacemaker interrogation today reveals poor ventricular rate control. I will therefore increase metoprolol to 50mg  BID.   He should ideally be on coumadin, pradaxa, or xarelto.  The patient's daughter is quite clear that given his falls and unsteadiness that he is not a candidate for these medications.  She is aware that though plavix is good for his CAD, it does not provide adequate stroke protection with afib.

## 2010-08-05 NOTE — Progress Notes (Signed)
[name] [name] is a pleasant 77 y.o. yo patient with a h/o bradycardia sp PPM (Vitatron)) by Dr Juanda Chance 02/15/05  who presents today to establish care in the Electrophysiology device clinic.  The patient is elderly and has dementia.  Despite this, he appears to be doing reasonably well.  He is a resident of [address] [place].  He remains very active despite his age.  Unfortunately, he has a h/o falls and significant instability.  He is for this reason felt to be a poor candidate for coumadin.  Today, he  denies symptoms of palpitations, chest pain, shortness of breath, orthopnea, PND, lower extremity edema, dizziness, presyncope, syncope, or neurologic sequela.  The patientis tolerating medications without difficulties and is otherwise without complaint today.   Past Medical History  Diagnosis Date  . Cardiac pacemaker in situ     s/p Viatron DDD Pacemaker for sinus node dysfunction  . Orthostatic hypotension   . Hyperlipidemia     mixed  . Atrial fibrillation     not felt to be a coumadin candidate due to falls  . Sinoatrial node dysfunction     s/p PPM (MDT)  . Coronary atherosclerosis of native coronary artery     s/p AWMI 2005 stent LAD  . Diabetes mellitus, type 2   . Chronic renal insufficiency, stage II (mild)   . Intolerance, drug     amniodarone  . Upper GI hemorrhage     2006 Mallory Weiss Tear  . MI (myocardial infarction)     coronary artery status post anterior wall myocardial infarction in 2005 treated with a stent to the left anterior descending.  . Dizziness     recurrent dizziness and falls  . Diabetes mellitus   . Hyperlipidemia   . Renal insufficiency     mild  . Dementia     Past Surgical History  Procedure Date  . Pacemaker placement   . Coronary stent placement     History   Social History  . Marital Status: Widowed    Spouse Name: N/A    Number of Children: N/A  . Years of Education: N/A   Occupational History  . retired     now  living in [facility]   Social History Main Topics  . Smoking status: Former Smoker    Quit date: 03/28/1960  . Smokeless tobacco: Not on file  . Alcohol Use: No  . Drug Use: No  . Sexually Active: Not on file   Other Topics Concern  . Not on file   Social History Narrative   Retired[place] of [address] [place].  Accompanied by daughter today.    Family History  Problem Relation Age of Onset  . Coronary artery disease Brother   . Diabetes Mother     Allergies  Allergen Reactions  . Amiodarone Hcl   . Sulfonamide Derivatives     REACTION: hives    Current Outpatient Prescriptions  Medication Sig Dispense Refill  . acetaminophen (TYLENOL) 500 MG tablet Take 500 mg by mouth every 6 (six) hours as needed.        Marland Kitchen atorvastatin (LIPITOR) 40 MG tablet Take 40 mg by mouth daily.        Marland Kitchen azithromycin (AZASITE) 1 % ophthalmic solution Place 1 drop into both eyes.        . bimatoprost (LUMIGAN) 0.03 % ophthalmic drops Place 1 drop into both eyes at bedtime.        . clopidogrel (PLAVIX) 75  MG tablet Take 75 mg by mouth daily.        Marland Kitchen dextromethorphan-guaiFENesin (MUCINEX DM) 30-600 MG per 12 hr tablet Take 1 tablet by mouth every 12 (twelve) hours.        . digoxin (LANOXIN) 0.125 MG tablet Take 250 mcg by mouth daily.        Marland Kitchen donepezil (ARICEPT) 5 MG tablet Take 5 mg by mouth at bedtime as needed.        Marland Kitchen esomeprazole (NEXIUM) 20 MG capsule Take 40 mg by mouth daily before breakfast.       . ferrous sulfate 325 (65 FE) MG tablet Take 325 mg by mouth daily with breakfast.        . fludrocortisone (FLORINEF) 0.1 MG tablet Take 0.1 mg by mouth 2 (two) times daily.        . furosemide (LASIX) 40 MG tablet Take 20 mg by mouth daily.        . Glucerna (GLUCERNA) LIQD Take 237 mLs by mouth 3 (three) times daily between meals.        . haloperidol (HALDOL) 0.5 MG tablet Take 0.5 mg by mouth 2 (two) times daily.        Marland Kitchen LORazepam (ATIVAN) 0.5 MG tablet  Take 1 mg by mouth as needed.       . magnesium oxide (MAG-OX) 400 MG tablet Take 400 mg by mouth daily.        . metFORMIN (GLUMETZA) 500 MG (MOD) 24 hr tablet Take 500 mg by mouth 2 (two) times daily.        . metoprolol (LOPRESSOR) 50 MG tablet Take 1 tablet (50 mg total) by mouth 2 (two) times daily.      Bertram Gala Glycol-Propyl Glycol (SYSTANE) 0.4-0.3 % SOLN Apply 1 drop to eye 4 (four) times daily.        . potassium chloride SA (K-DUR,KLOR-CON) 20 MEQ tablet Take 20 mEq by mouth daily.        Marland Kitchen venlafaxine (EFFEXOR-XR) 150 MG 24 hr capsule Take 150 mg by mouth daily.        Marland Kitchen DISCONTD: metoprolol tartrate (LOPRESSOR) 25 MG tablet Take 25 mg by mouth 3 (three) times daily.        Marland Kitchen DISCONTD: metoprolol tartrate (LOPRESSOR) 50 MG tablet Take 0.5 tablets (25 mg total) by mouth 2 (two) times daily.        ROS- all systems are reviewed and negative except as per HPI  Physical Exam: Filed Vitals:   08/05/10 1454  BP: 150/78  Pulse: 68  Resp: 18  Height: 5\' 10"  (1.778 m)  Weight: 125 lb 12.8 oz (57.063 kg)    GEN- The patient is elderly, alert today,  Head- normocephalic, atraumatic Eyes-  Sclera clear, conjunctiva pink Ears- hearing intact Oropharynx- clear Neck- supple, no JVP Lymph- no cervical lymphadenopathy Lungs- Clear to ausculation bilaterally, normal work of breathing Chest- pacemaker pocket is well healed Heart- irregular rate and rhythm,  GI- soft, NT, ND, + BS Extremities- no clubbing, cyanosis, trace edema MS- age appropriate muscle atrophy Skin- no rash or lesion Psych- significant dementia.  He thinks that his daughter is his wife.  He also thinks that he continues to work but is unable to tell me what kind of work he does Neuro- strength and sensation are intact  Pacemaker interrogation- reviewed in detail today,  See PACEART report  Assessment and Plan:

## 2010-08-05 NOTE — Patient Instructions (Signed)
Your physician wants you to follow-up in: 6 months with Dr Jacquiline Doe will receive a reminder letter in the mail two months in advance. If you don't receive a letter, please call our office to schedule the follow-up appointment.    Your physician has recommended you make the following change in your medication: change your Metoprolol to 50mg   Twice daily

## 2010-08-05 NOTE — Assessment & Plan Note (Signed)
Normal pacemaker function See Pace Art report No changes today  

## 2010-08-09 ENCOUNTER — Telehealth: Payer: Self-pay | Admitting: Internal Medicine

## 2010-08-09 NOTE — Telephone Encounter (Signed)
Per pt daughter calling, can Dr. Johney Frame  write an order for the med tech to take blood pressure prior to given lopressor. Or hold the lopressor if blood pressure is  too low.

## 2010-08-10 NOTE — Assessment & Plan Note (Signed)
[facility]                            CARDIOLOGY OFFICE NOTE   NAME:Truxillo, GRACIANO BATSON                      MRN:          [id number]  DATE:02/14/2007                            DOB:          03/18/1923    PRIMARY CARE PHYSICIAN:  Benedetto Goad.   CLINICAL HISTORY:  Antonin Meininger returned for followup management of his  atrial fibrillation and coronary heart disease and pacemaker.  He has a  long history of paroxysmal atrial fibrillation which was initially  controlled on flecainide, but when he developed coronary disease we had  to stop this, and we have finally been able to get control with the use  of pacemaker and sotalol.  He says he has had no recent symptoms of  palpitations.  He saw Dr. Ladona Ridgel 2 weeks ago, and interrogation of his  pacemaker at that time showed he was in atrial fibrillation 1.5% of the  time, and he was not pacing his ventricle at all.  He was pacing his  atrium most of the time.   He also has coronary heart disease, and had an anterior wall myocardial  infarction in 2005 treated with a stent.   PAST MEDICAL HISTORY:  1. Diabetes.  2. Hyperlipidemia.  3. Renal insufficiency.   CURRENT MEDICATIONS:  1. Aspirin.  2. Nexium.  3. Coumadin.  4. Digoxin.  5. Sotalol.  6. Actos.  7. Effexor.  8. Lipitor.  9. Metformin.  10.Ranitidine.  11.Metoclopram.   PHYSICAL EXAMINATION:  Blood pressure 110/66 and the pulse 69 and  regular.  There was no venous distention.  The carotid pulses were full without  bruits.  CHEST:  Clear.  Cardiac rhythm was regular.  I could hear no murmurs or gallops.  The abdomen was soft without organomegaly.  Peripheral pulses were full, and there is no peripheral edema.   His electrocardiogram that Dr. Ladona Ridgel did 2 weeks ago showed a QTc of  400 ms.   IMPRESSION:  1. Sick sinus syndrome with paroxysmal atrial fibrillation, now stable      and controlled with sotalol and Coumadin.  2. Status  post Vitatron DDD pacemaker implantation with good pacer      function.  3. Coronary heart disease, status post anterior wall myocardial      infarction, 2005, treated with a stent to the left anterior      descending.  4. Diabetes.  5. Hyperlipidemia.  6. Renal insufficiency.   RECOMMENDATIONS:  I think Mr. Boodram is doing quite well.  He does have  a slight postural dizziness, but this does not appear to be very  disabling.  He has learned to compensate for this.  Will plan to get  some fasting laboratory work, including a lipid, liver, CBC, BMP, and  TSH.  I will see him back in followup in 6 months.     Bruce [name], MD, [facility]  Electronically Signed    BRB/MedQ  DD: 02/14/2007  DT: 02/14/2007  Job #: [id number]

## 2010-08-10 NOTE — Assessment & Plan Note (Signed)
[place] [facility]                            CARDIOLOGY OFFICE NOTE   NAME:Wirt, GARRICK MIDGLEY                      MRN:          [id number]  DATE:08/22/2007                            DOB:          1923-02-16    PRIMARY CARE PHYSICIAN:  Gloriajean Dell. Andrey Campanile, M.D.   CLINICAL HISTORY:  Knox Cervi returns for follow-up management of his  atrial fib, coronary heart disease and pacemaker.  He had a long history  of atrial fibrillation which was controlled on flecainide, but then  developed an anterior MI.  We stopped the flecainide.  He had previously  had reaction to amiodarone, and we finally were able to get his rhythm  under control with sotalol and a pacemaker.   He says over the last couple months he has had increasing difficulty  with dizziness and falls.  These almost always occur after has been  sitting for a while and gets up and he feels lightheaded and dizzy.  He  tries to get up slowly to minimize these, but cannot always do that.  He  fell last Friday and hurt his buttocks.  So far, he has not had any  serious injuries.   He has coronary heart disease and had anterior wall infarction in 2005  and was treated with a stent to the LAD.   PAST MEDICAL HISTORY:  1. Diabetes.  2. Hyperlipidemia.  3. Renal insufficiency.   CURRENT MEDICATIONS:  1. Aspirin.  2. Nexium.  3. Coumadin.  4. Digoxin.  5. Sotalol 160 mg b.i.d.  6. Actos.  7. Effexor.  8. Lipitor 40 mg daily.  9. Metformin.  10.Ranitidine.  11.Metoclopramide.   PHYSICAL EXAMINATION:  VITAL SIGNS:  Blood pressure is 108/66 and pulse  63 and regular.  NECK:  There is no venous distention  Carotid pulses were full without  bruits.  CHEST:  Clear.  HEART:  Rhythm is regular.  No murmurs or gallops.  ABDOMEN:  Soft, no organomegaly.  Peripheral pulses full with no  peripheral edema.   STUDIES:  Electrocardiogram showed atrial pacing with intrinsic  conduction and nonspecific ST-T  changes.   IMPRESSION:  1. Postural dizziness, probably related to postural hypotension.  2. Sick sinus syndrome with paroxysmal atrial fibrillation, now      treated with sotalol and Coumadin.  3. Status post Viatron DDD pacemaker implantation with good pacer      function.  4. Coronary artery disease status post anterior wall myocardial      infarction 2005.  Treated with stent to the LAD.  5. Diabetes.  6. Hyperlipidemia.  7. Chronic renal insufficiency with creatinine in the range of 1.2.   RECOMMENDATIONS:  Will check postural blood pressures today.  If this  confirms possible hypotension, then I told him to liberalize the salt in  his diet and we will start him on Proamatine 2.5 t.i.d. for 3 days and 5  mg t.i.d.  I will plan to see him back in 6 weeks to assess his response  or sooner if he has difficulty.  Will also get some blood work today  to  make sure he is not had any bleeding or other things that would  contribute to his problem.   ADDENDUM:  We did postural blood pressures on him and his lying pressure  was 129/74, sitting was 106/63, and standing 89/57.  He had no symptoms  with this.   Based on these findings and his history, I think the problem with the  dizziness and presyncope is related to postural hypotension.  Will start  him on ProAmatine 5 mg 1/2 tablet t.i.d. and increase to 5 mg after 4 to  5 days.  The full dose is 10 mg t.i.d. if he does not respond to the  lower dose.  I also told him to liberalize his salt in his diet.  We  will see him back in 6 to 8 weeks.     Bruce [name], MD, [facility]  Electronically Signed    BRB/MedQ  DD: 08/22/2007  DT: 08/22/2007  Job #: [id number]

## 2010-08-10 NOTE — Assessment & Plan Note (Signed)
[facility] [facility]                            CARDIOLOGY OFFICE NOTE   NAME:Keagle, ZAKAR BROSCH                      MRN:          [id number]  DATE:10/05/2007                            DOB:          Aug 27, 1922    PRIMARY CARE PHYSICIAN:  Gloriajean Dell. Andrey Campanile, M.D.   CLINICAL HISTORY:  [name] [name] is 77 years old and returns for  followup management of his atrial fib, coronary heart disease, and  pacemaker.  We had seen him recently and he has had symptoms of postural  hypotension.  We had tried him on ProAmatine, but this made his symptoms  worse, and we finally stopped that.  Since that time, his possible  dizziness has improved and he says it has pretty much resolved.   He has had no recent problems with chest pain, shortness of breath, or  palpitations.   He has a long history of paroxysmal atrial fibrillation and was  initially on amiodarone and then developed a skin rash from that and  then was controlled on flecainide, developed a heart attack, and we had  to stop that.  We finally got it controlled with a combination of  sotalol and a pacemaker.  He also has had a previous anterior wall MI  treated with stent to the LAD in 2005.   PAST MEDICAL HISTORY:  Significant for:  1. Diabetes.  2. Hypertension.  3. Hyperlipidemia.  4. Renal insufficiency.   CURRENT MEDICATIONS:  1. Aspirin.  2. Nexium.  3. Coumadin.  4. Digoxin 0.125 mg daily.  5. Sotalol 150 mg b.i.d.  6. Actos.  7. Effexor.  8. Lipitor 40 mg daily.  9. Metformin.  10.Ranitidine.  11.Metoclopramide.  12.Vitamins.  13.Mobic.   PHYSICAL EXAMINATION:  VITAL SIGNS:  The blood pressure was 95/63 and  the pulse was 66 and regular.  NECK:  There was no venous distention.  The carotid pulses were full  without bruits.  CHEST:  Clear.  CARDIAC:  Rhythm was regular.  I could hear no murmurs or gallops.  ABDOMEN:  Soft with normal bowel sounds.  EXTREMITIES:  Peripheral pulses are full.   No peripheral edema.   Pacemaker had a good threshold and good impedance on both leads.  He is  pacing in the atrium 97% of the time and the ventricle 0% of the time.  He did have 5 mode-switches, the longest of which was 7 hours with  ventricular rates of about 90-100.  This was 2% of his total rhythm.  He  was not symptomatic with this.   IMPRESSION:  1. Sick sinus syndrome with paroxysmal atrial fibrillation controlled      with sotalol and Coumadin.  2. Status post Viatron DDD pacemaker implantation with a good pacer      function.  3. Coronary artery disease, status post anterior wall myocardial      infarction in 2005, treated with a stent to the LAD.  4. Diabetes.  5. Hyperlipidemia.  6. Mild chronic renal insufficiency.  7. Possible dizziness, now resolved.   RECOMMENDATIONS:  I think Mr. Debruin is doing well.  He still had some  atrial fibrillation, but he is not symptomatic from this.  I will plan  to continue with the same medications.  I will see him back in followup  in 6 months.     Bruce [name], MD, [facility]  Electronically Signed    BRB/MedQ  DD: 10/05/2007  DT: 10/06/2007  Job #: [id number]

## 2010-08-10 NOTE — Assessment & Plan Note (Signed)
[place] [facility]                         ELECTROPHYSIOLOGY OFFICE NOTE   NAME:[name] [name], [name] [name]                      MRN:          [id number]  DATE:01/31/2007                            DOB:          [birth year]    [name] [name] returns today for followup.  He is a very pleasant elderly  man with a history of symptomatic bradycardia and paroxysmal atrial  fibrillation, who also has diabetes, who is status post pacemaker  insertion back in November, 2006.  He returns today for followup.  He  has been stable except for problems with dizziness.  He denies much in  the way of palpitations.  He denies chest pain.  He denies shortness of  breath.   MEDICATIONS:  1. Aspirin 81 mg daily.  2. Nexium 40 a day.  3. Coumadin as directed.  4. Digoxin 0.125 mg daily.  5. Sotolol 160 mg twice daily.  6. Actos 50 mg daily.  7. Effexor 150 mg daily.  8. Lipitor 40 a day.  9. Metformin 500, 4 tablets daily.  10.Ranitidine 300 mg nightly.   PHYSICAL EXAMINATION:  He is a pleasant, well-appearing elderly man in  no distress.  Blood pressure is 122/70, pulse 60 and regular, respirations 18.  Weight  was 165 pounds.  NECK:  No jugular venous distention.  LUNGS:  Clear bilaterally to auscultation.  There are no wheezes, rales  or rhonchi.  CARDIOVASCULAR:  Regular rate and rhythm with a normal S1 and S2.  EXTREMITIES:  No edema.   EKG demonstrates atrial pacing with ventricular sensing.   Interrogation of his pacemaker today demonstrates a Medtronic Vitatron  device with T waves of greater than 1 and R waves greater than 8.  The  impedance is 450 in the atrium and 600 in the ventricle.  Threshold was  0.875 at 0.4 in the atrium, 0.5 at 0.4 in the ventricle.  Battery  voltage was 2.8 volts.  He was mode-switched in A fib 1.5% of the time.  He was 95% A paced, 0% V paced.   IMPRESSION:  1. Symptomatic bradycardia.  2. Paroxysmal atrial fibrillation.  3. Dizziness,  predominantly orthostatically mediated.  4. Diabetes.  5. Hypertension.   DISCUSSION:  [name] [name] main complaint today is that of dizziness  which occurs when he stands up quickly or when he is otherwise standing.  I have encouraged him to sit down or lie down with this, and I have also  asked that he increase his salt and fluid intake and refrain from  caffeinated beverages.  I will have him follow up with my partner, Dr.  Juanda Chance, who has been his primary cardiologist in the past and see him  back on a p.r.n. basis.  He will continue with sotalol for now.  While  there is a little bit of concern about the sotalol dose, his Q-T  interval is normal today, so there is no evidence he is getting too much  based on Q-T prolongation.     [name]. Ladona Ridgel, MD  Electronically Signed    GWT/MedQ  DD: 01/31/2007  DT: 01/31/2007  Job #: [id number]   cc:   Gloriajean Dell. Andrey Campanile, M.D.

## 2010-08-10 NOTE — Assessment & Plan Note (Signed)
[facility]                            CARDIOLOGY OFFICE NOTE   NAME:[name] [name], [name] [name]                      MRN:          [id number]  DATE:08/22/2007                            DOB:          Oct 14, 1922    ADDENDUM:  We did postural blood pressures on him and his lying pressure  was 129/74, sitting was 106/63, and standing 89/57.  He had no symptoms  with this.   Based on these findings and his history, I think the problem with the  dizziness and presyncope is related to postural hypotension.  Will start  him on ProAmatine 5 mg 1/2 tablet t.i.d. and increase to 5 mg after 4 to  5 days.  The full dose is 10 mg t.i.d. if he does not respond to the  lower dose.  I also told him to liberalize his salt in his diet.  We  will see him back in 6 to 8 weeks.     Bruce [name], MD, [facility]     BRB/MedQ  DD: 08/22/2007  DT: 08/22/2007  Job #: [id number]

## 2010-08-10 NOTE — Assessment & Plan Note (Signed)
[name] [name] [name]                            CARDIOLOGY OFFICE NOTE   NAME:[name] [name], [name] NEWSHAM                      MRN:          [id number]  DATE:02/28/2008                            DOB:          [birth year]    PRIMARY CARDIOLOGIST:  Everardo Beals. Juanda Chance, MD, [name]   PRIMARY CARE PHYSICIAN:  Gloriajean Dell. Andrey Campanile, MD   CLINICAL HISTORY:  This is an 77 year old white male patient of Dr.  Charlies Constable, who has a history of atrial fibrillation, coronary artery  disease, and pacemaker.  Over the past year or so, he has had trouble  with orthostatic hypotension.  Dr. Juanda Chance has tried him on ProAmatine,  but this actually made him worse and this was stopped.  He says since he  saw Dr. Juanda Chance back in July 2009, he has had worsening of his dizziness  when he gets up quickly from a sitting or lying position.  He has had  actually 3 episodes in which he almost passed out.  He said he becomes  so dizzy that he falls, but he is aware of what is going on and he has  not had frank syncope, one episode he hurt his  knee and actually had to  go into rehab.  He had an episode another episode when he got up quickly  and walk to the kitchen and he fell against his countertops and hit his  arms.  He says since these 3 episodes he has been able to control it and  he has been much more careful about getting up and he has not had any  episodes where he has fallen.  The patient denies any associated chest  pain, palpitations, or dyspnea with these episodes.   CURRENT MEDICATIONS:  1. Aspirin 81 mg daily.  2. Nexium 40 mg daily.  3. Coumadin as directed.  4. Digoxin 0.125 mg daily.  5. Sotalol 160 mg b.i.d.  6. Actos 45 mg daily.  7. Effexor 150 mg daily.  8. Lipitor 40 mg daily.  9. Metformin 500 mg q.i.d.  10.Ranitidine 300 mg nightly.  11.Metoclopramide 10 mg nightly.   PHYSICAL EXAMINATION:  GENERAL:  This is a very pleasant 77 year old  white male in no acute distress.  VITAL  SIGNS:  Orthostatic blood pressures were performed.  Please see  chart for details.  Blood pressure dropped from 123/67 down to 96/61  from lying to sitting, but he was asymptomatic with this.  NECK:  Without JVD, HJR, bruit, or thyroid enlargement.  LUNGS:  Decreased breath sounds, but clear anterior, posterior, and  lateral.  HEART:  Regular rate and rhythm at 60 beats per minute.  Normal S1 and  S2 with a 1/6 systolic ejection murmur at the left sternal border.  ABDOMEN:  Soft without organomegaly, masses, lesions, or abnormal  tenderness.  EXTREMITIES:  Without cyanosis, clubbing, or edema.  He has good distal  pulses.   IMPRESSION:  1. Recurrent orthostatic dizziness with falls.  2. Sick sinus syndrome with paroxysmal atrial fibrillation, controlled      with sotalol.  3. Chronic Coumadin.  4.  Status post Vitatron DDD pacemaker implantation with good pacer      function.  5. Coronary artery disease, status post anterior wall myocardial      infarction in 2005, treated with the stent to the left anterior      descending.  6. Diabetes mellitus.  7. Hyperlipidemia.  8. Mild chronic renal insufficiency.  9. Normal left ventricular function, ejection fraction of 65%.   PLAN:  I discussed this patient with Dr. Juanda Chance, concerning his falls  and chronic Coumadin. Because the patient has confidence that he can  control it,  Dr. Juanda Chance would like to continue the Coumadin at this  time.  If he has any more significant episodes of orthostatic  hypotension,  falling, and near-syncope, we will consider stopping the  Coumadin.  The patient would like to stay on it at this time and will  notify us if he has any further problems.  He will see Dr. Juanda Chance back  in 2 months.      Jacolyn Reedy, PA-C  Electronically Signed      Everardo Beals. Juanda Chance, MD, [facility]  Electronically Signed   ML/MedQ  DD: 02/28/2008  DT: 02/29/2008  Job #: [id number]

## 2010-08-10 NOTE — Assessment & Plan Note (Signed)
[facility]                            CARDIOLOGY OFFICE NOTE   NAME:[name] [name], [name] [name]                      MRN:          [id number]  DATE:08/15/2006                            DOB:          [birth year]    PRIMARY CARE PHYSICIAN:  Gloriajean Dell. Andrey Campanile, M.D., in [place].   CLINICAL HISTORY:  Giovanny Dugal returned for management of his atrial  fibrillation and coronary heart disease and hyperlipidemia.  He has done  fairly well.  Since his last visit, he has had no chest pain and no  palpitations.  He does have episodic dizziness which occurs maybe once  every week or every other week and lasts for about 30 minutes.  When he  has this, he sits down, and then it passes.  In the past, we have  associated this with his atrial fibrillation.   PAST MEDICAL HISTORY:  1. Diabetes.  2. Hyperlipidemia.  3. Renal insufficiency.  4. Coronary heart disease.  5. He had an anterior wall myocardial infarction, treated with      stenting of the LAD in 2005.   CURRENT MEDICATIONS:  Aspirin, Nexium, Coumadin, digoxin, Lipitor,  metformin, and sotalol.   PHYSICAL EXAMINATION:  VITAL SIGNS:  Blood pressure is 115/69, pulse 63  and regular.  NECK:  There was no venous distention.  The carotid pulses were full,  without bruits.  CHEST:  Clear.  CARDIAC:  Rhythm was regular.  There were no murmurs or gallops.  ABDOMEN:  Soft, with normal bowel sounds.  There was no  hepatosplenomegaly.  EXTREMITIES:  There was no peripheral edema.  Had some difficulty  feeling pedal pulses.   His electrocardiogram shows atrial pacing and was otherwise normal.  His  QTc was 424 msec.   On interrogation of his pacemaker, he has had mode switches with atrial  fibrillation about 3% of the time.  He is atrial pacing most of the time  and did not have any ventricular pacing.  His thresholds were good on  both channels.   IMPRESSION:  1. Sick sinus syndrome, with paroxysmal atrial  fibrillation, stable.  2. Status post Vitatron DDD pacemaker implantation, with good pacer      function.  3. Dizziness, probably related to atrial fibrillation, now stable.  4. Coronary artery disease, status post remote anterior wall      myocardial infarction in 2005, treated with a stent to the left      anterior descending, now stable.  5. Hyperlipidemia, treated.  6. Diabetes.  7. Renal insufficiency.   RECOMMENDATIONS:  I think Mr. Rosenberry is doing reasonably well at  present.  Will plan to get a BMP and CBC today to assess his renal  function.  I will plan to see him back in followup in 6 months.     Bruce [name], MD, [facility]  Electronically Signed    BRB/MedQ  DD: 08/15/2006  DT: 08/15/2006  Job #: [id number]

## 2010-08-10 NOTE — Assessment & Plan Note (Signed)
[facility]                            CARDIOLOGY OFFICE NOTE   NAME:Ninh, DEONTEZ KLINKE                      MRN:          [id number]  DATE:05/01/2008                            DOB:          10-15-1922    PRIMARY CARE PHYSICIAN:  Gloriajean Dell. Andrey Campanile, MD   CLINICAL HISTORY:  Mr. Rodenberg is 77 years old and returned for a  followup management of his atrial fibrillation and coronary heart  disease and pacemaker.  He has had a long history of atrial fibrillation  and was intolerant with amiodarone and was controlled with flecainide,  but then developed coronary disease and is currently being controlled  with sotalol combined with a pacemaker.  He also has coronary artery  disease and an anterior myocardial infarction treated with stenting of  the LAD.  His LV function has been good with an ejection fraction of 65%  by last echo.   He says he has been doing fairly well with his heart and has had no  chest pain, shortness of breath, or palpitations.  Biggest problem is  related to falls.  He gets dizzy when he gets up and he has falling and  bruising.  We debated about whether to continue his Coumadin or not on  his last visit and decided to continue it.  This falls have been less,  but he still has had some falls.   Past medical history is significant for diabetes, hyperlipidemia, mild  renal insufficiency.   His current medications include:  1. Metformin.  2. Aspirin.  3. Nexium.  4. Coumadin.  5. Digoxin 0.125 mg daily.  6. Sotalol 160 mg b.i.d.  7. Actos.  8. Effexor.  9. Lipitor 40 mg daily.  10.Ranitidine.  11.Metoclopramide.   On examination, the blood pressure was 130/60 and the pulse 60 and  regular.  There was no venous distension.  The carotid pulses were full  without bruits.  Chest was clear.  Cardiac rhythm was regular.  I could  hear no murmurs or gallops.  The abdomen was soft with normal bowel  sounds.  There was no  hepatosplenomegaly.  Peripheral pulses were equal  and there was no peripheral edema.   We interrogated his pacemaker today and he had good thresholds in both  the atrial and ventricular leads.  He had 60 mode switches and was in  atrial fib 2% of the time.  He was atrial pacing 97% of the time and  ventricular pacing 0% of the time with a long AV delay programmed.   IMPRESSION:  1. Paroxysmal atrial fibrillation, controlled on sotalol.  2. Chronic Coumadin therapy.  3. Status post dual-mode, dual-pacing, dual-sensing pacemaker      implantation with good pacer function.  4. Coronary artery status post anterior wall myocardial infarction in      2005, treated with a stent to the left anterior descending.  5. Recurrent dizziness and falls.  6. Diabetes.  7. Hyperlipidemia.  8. Mild renal insufficiency.   RECOMMENDATIONS:  Mr. Robb appears to be doing fairly well, still  having some trouble with falls,  and I discussed the pros and cons of  continued Coumadin.  He is okay with continuing Coumadin.  We will plan  to continue that and he plans to be as careful as possible about getting  up and avoiding dizzy spells and falls.  We will get laboratory work at  [facility] which will include a lipid and liver, CBC, BNP, and TSH.  I  will see him back for followup in 6 months.     Bruce [name], MD, [facility]  Electronically Signed    BRB/MedQ  DD: 05/01/2008  DT: 05/01/2008  Job #: [id number]

## 2010-08-11 NOTE — Telephone Encounter (Signed)
Pt's dtr calling back requesting a call back from message left 5-14, wants to speak to nurse before order going to [facility] where pt resides

## 2010-08-11 NOTE — Telephone Encounter (Addendum)
Patient's daughter would like for her father to have an order for the medical assistants to hold his BP medication if needed. Will route to Dr. Johney Frame to see which BP parameters he prefers. The order will need to be faxed to [facility], Attn: Nursing Staff. Fax # [phone].

## 2010-08-13 NOTE — H&P (Signed)
[name], [name]               ACCOUNT NO.:  [id number]   MEDICAL RECORD NO.:  [id number]          PATIENT TYPE:  INP   LOCATION:  2007                         FACILITY:  [facility]   PHYSICIAN:  Learta Codding, M.D. LHCDATE OF BIRTH:  November 25, 1922   DATE OF ADMISSION:  04/24/2004  DATE OF DISCHARGE:                                HISTORY & PHYSICAL   PRIMARY CARE PHYSICIAN:  [facility].   CARDIOLOGIST:  Charlies Constable, M.D.   REASON FOR ADMISSION:  Recurrent atrial fibrillation.   HISTORY OF PRESENT ILLNESS:  [name] is an 77 year old male  patient of Dr. Regino Schultze with a long history of paroxysmal atrial  fibrillation in the past controlled on flecainide.  The patient was  hospitalized in September of 2005 with an anterior wall myocardial  infarction.  At that time, he suffered an out of hospital ventricular  fibrillation arrest in the setting of an acute coronary syndrome, but he was  promptly resuscitated.  The patient underwent stenting subsequently of the  left anterior descending artery by Dr. Juanda Chance.  Flecainide was stopped  during that hospitalization due to ischemic heart disease and LV  dysfunction.  During that admission, the patient developed recurrent atrial  fibrillation.  And was treated with sotalol.  He then reverted back to  normal sinus rhythm.  Initially QT was somewhat long, but improved towards  the end of his hospitalization.  The patient saw Dr. Juanda Chance in the office on  March 10, 2004, with no specific complaints.  He has been maintained on  triple anticoagulation regimen since his anterior wall myocardial  infarction.   The patient is now admitted because of recurrent atrial fibrillation.  The  patient was awoken suddenly this morning with a rapid heart rate and mild  shortness of breath.  He denied any substernal chest pain.  He felt presumed  that he went back into atrial fibrillation and called 911.  He presented to  the  emergency room.  On arrival, the patient was in rapid atrial  fibrillation.  He was given IV Cardizem by Dr. Ethelda Chick.  He denied any  substernal chest pain and there were no acute ischemic changes suggestive of  ischemia seen.   ALLERGIES:  No known drug allergies.   MEDICATIONS:  1.  Aspirin 81 mg a day.  2.  Metformin 5 mg p.o. b.i.d.  3.  Nexium 40 mg a day.  4.  Ambien 10 mg p.o. p.r.n.  5.  Plavix 75 mg a day.  6.  Lipitor 40 mg a day.  7.  Warfarin 5 mg a day and half of a pill twice a week.  8.  Nitroglycerin 0.4 mg p.r.n.  9.  Sotalol 80 mg p.o. b.i.d.   SOCIAL HISTORY:  The patient denies any alcohol or drug use.  [place]   FAMILY HISTORY:  Noncontributory.   REVIEW OF SYSTEMS:  As outlined above.  The patient denies any orthopnea or  PND.  He denies any syncope.  He has no substernal chest pain.  He has no  abdominal pain.  He reports no claudication symptoms.   PHYSICAL EXAMINATION:  VITAL SIGNS:  Blood pressure 120/60, heart rate  initially 143 beats per minute and then decreased to 100 beats per minute  after Cardizem, temperature afebrile.  NECK:  Normal carotid upstroke.  No carotid bruits.  LUNGS:  Clear breath sounds bilaterally.  HEART:  Irregular rate and rhythm with normal S1 and S2.  No definite S3.  LUNGS:  Clear bilaterally.  ABDOMEN:  Soft and nontender.  No rebound or guarding.  Good bowel sounds.  EXTREMITIES:  No edema.   LABORATORY DATA:  At 12-lead electrocardiogram reveals atrial fibrillation  with rapid ventricular response, low-voltage QRS in the extremity leads only  and poor R-wave progression in the anterior precordium consistent with prior  anterior wall myocardial infarction.   Hemoglobin 13.3, hematocrit 38.4, white count 5.0, platelet count 181.  Sodium 141, potassium 4.1, BUN 18, creatinine 1.0, glucose 134, bilirubin  1.4, troponin negative x 2.  TSH is pending.  The INR is 2.7.   PROBLEM  LIST:  1.  Recurrent paroxysmal atrial fibrillation with rapid ventricular      response.  2.  Sotalol therapy on chronic maintenance dose of 80 mg p.o. b.i.d.  3.  History of anterior wall myocardial infarction in September of 2005.      1.  Status post drug-eluding stent to the left anterior descending.      2.  Left ventricular ejection fraction 50%.      3.  Plavix recommended for nine months.  4.  History of ventricular fibrillation secondary to anterior wall      myocardial infarction in September of 2005.  5.  Triple anticoagulation regimen as per Dr. Juanda Chance.  6.  Diabetes mellitus.  7.  Dyslipidemia.  8.  History of bradycardia with possible sick sinus syndrome.   PLAN:  1.  Admit the patient for rate control and will add calcium channel blocker      to his medical regimen.  2.  Increase sotalol to 120 mg p.o. b.i.d. and monitor QTC.  3.  The patient is refractory to increase in sotalol drug therapy.  Consider      cardioversion versus future use of amiodarone therapy.  4.  Monitor blood pressure on Cardizem.  5.  Anticipate discharge in the next two to three days after adjustment of      sotalol dosing.    GED/MEDQ  D:  04/24/2004  T:  04/24/2004  Job:  [id number]   cc:   Charlies Constable, M.D. [facility]

## 2010-08-13 NOTE — H&P (Signed)
NAME:  [name] [name] NO.:  [id number]   MEDICAL RECORD NO.:  [id number]          PATIENT TYPE:  INP   LOCATION:  1827                         FACILITY:  [facility]   PHYSICIAN:  Willa Rough, M.D.     DATE OF BIRTH:  1922-04-24   DATE OF ADMISSION:  09/29/2005  DATE OF DISCHARGE:                                HISTORY & PHYSICAL   PRIMARY CARDIOLOGIST:  Charlies Constable, M.D.   PRIMARY CARE PHYSICIAN:  Gloriajean Dell. Andrey Campanile, M.D., at [facility].   CHIEF COMPLAINT:  Atrial fibrillation.   [name] is a very pleasant 77 year old Caucasian gentleman with a known  history of paroxysmal atrial fibrillation treated in the past with Tikosyn  and Tambocor, apparently attempted treatment with amiodarone but the patient  did not tolerate.  He is status post a TEE with cardioversion in May 2007,  at which time he converted to sinus rhythm and has been maintained on  sotalol 120 mg b.i.d. since then.  Approximately 3 days ago he noticed he  started feeling more short of breath and dizzy at times.  He states he felt  just weak and generally bad all over.  It seemed more pronounced when he was  up moving around.  He continues to visit his wife 3 days a week at a local  nursing home, where she is a patient with dementia.  He was getting ready to  go there this morning when he states his chest began to have palpitations.  He checked pulse and he said it was 120.  He sat down to rest.  His pulse  came down to 90 but it continued to be fast and he was short of breath.  He  presented himself to [facility], at which time EKG was done  and showed the patient was in atrial fibrillation and EMS transported the  patient here to [facility] for further evaluation.  Mr. Ozment states no  change in his activities, no use of over-the-counter medications.  He denies  drinking any caffeine.  No increased stress in his life lately.  He denies  any episodes of chest pain or tightness with  palpitation.  He does complain  of some dizziness and presyncopal episodes at times.  He states his last  Coumadin level was checked 2 weeks ago.  As he recalls, it was 1.9 at that  time and adjustments were made in his Coumadin dose.   ALLERGIES:  AMIODARONE and SULFA.   MEDICATIONS:  1.  Sotalol 120 mg b.i.d.  2.  Warfarin 5 mg every other day rotating with 2.5 mg.  3.  Lipitor 20 mg.  4.  Metformin 500 mg daily.  5.  Nexium 40 mg daily.  6.  Aspirin 81 mg daily.  7.  Digoxin 0.125 mg daily.   PAST MEDICAL HISTORY:  1.  Coronary artery disease, status post MI/catheterization with placement      of a drug-eluting stent to the LAD in 2005, ejection fraction calculated      at 50% by catheterization at that time.  2.  Anticoagulation therapy  with Coumadin.  3.  History of paroxysmal atrial fibrillation, status post TEE with DCCV May      2007 with sotalol therapy.  4.  Hyperlipidemia.  5.  GERD.  6.  History of tachybrady syndrome, status post DDD Medtronic pacemaker in      November 2006.  7.  History of diabetes type 2.  8.  History of hypertension.  9.  History of upper GI bleed in November 2006.  10. History of bladder tumor, status post resection.   SOCIAL HISTORY:  The patient lives in [place].  He lives alone.  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  He is married; however, his wife is in a  nursing home secondary to dementia.  Denies any ETOH, tobacco, drug or  herbal medication use.  He tries to follow a heart-healthy diet.  For  exercise, he walks.   FAMILY HISTORY:  Mother deceased at a late age secondary to coronary artery  disease, status post MI.  He has a brother who has also had an MI.   REVIEW OF SYSTEMS:  Positive for tenderness to right foot.  Positive for  shortness of breath, dyspnea on exertion, palpitations, presyncope.  Positive for generalized weakness.  All other systems negative per patient.   PHYSICAL EXAMINATION:  VITAL SIGNS:  Temperature  96.8, pulse 116,  respirations are 18, blood pressure 98/64.  The patient is saturating 100%  on 2 L.  GENERAL:  He is in no acute distress.  He is a very pleasant, elderly  gentleman.  HEENT:  Normocephalic, atraumatic.  Pupils equal, round, and reactive to  light.  Sclerae are clear.  NECK:  Supple without lymphadenopathy, negative bruit or JVD.  CARDIOVASCULAR:  S1 and S2, irregular heart rhythm.  Pulses are 2+ and equal  without bruit.  LUNGS:  Clear to auscultation bilaterally.  Bruising noted on right foot,  tender to touch.  Positive pulses.  ABDOMEN:  Soft, nontender.  Positive bowel sounds.  EXTREMITIES:  Without clubbing, cyanosis, or edema.  Noted bruising to right  upper foot, tender to touch.  Positive DPs.  NEUROLOGIC:  Patient alert and oriented x3.  Cranial nerves II-XII grossly  intact.   Chest x-ray showing no acute changes.  EKG at a rate of 125, atrial  fibrillation, without acute ST or T-wave changes.  Pacer spikes noted at  times.   Lab work showing a WBC of 9.1, H&H 12.7, hematocrit 38.9, with a platelet  count of 194,000.  Sodium 138, potassium 4.6, chloride 103, CO2 31, BUN 32,  creatinine 1.0, with a glucose of 115.  First set of cardiac markers:  Troponin 0.03 with a CK-MB of 1.1.  PT 39.4 with an INR of 4.1 currently.   After Dr. Myrtis Ser was in to examine and assess the patient, the patient voiced  the return of atrial fibrillation, symptomatic even while on sotalol 120 mg  b.i.d.  Dr. Myrtis Ser is hesitant to push higher in the dose in the sotalol in  this 77 year old gentleman.  The patient also is pacing at a rapid rate.  INR approximately 2 weeks ago was 1.9, now INR is 4.1.   The plan will be to admit him, check an INR, last value documented at 1.9.  I will call [facility] office and try to obtain results.  Control the  patient's rate with some p.r.n. Cardizem.  Have Medtronic interrogate his pacer.  As patient is stable with rate control, keep on  Coumadin  until INR  maintained greater than 2.0 for 3 weeks, then cardiovert again.  Also will  check a digoxin level.      Dorian Pod, NP    ______________________________  Willa Rough, M.D.    MB/MEDQ  D:  09/29/2005  T:  09/29/2005  Job:  [id number]

## 2010-08-13 NOTE — Assessment & Plan Note (Signed)
[facility]                              CARDIOLOGY OFFICE NOTE   NAME:[name] [name], [name] [name]                      MRN:          [id number]  DATE:12/09/2005                            DOB:          05-Nov-1922    PRIMARY CARE PHYSICIAN:  Dr. Gloriajean Dell. Valentino Hue [facility].   CLINICAL COURSE:  [name] is 77 years old and returned today at the  request of Dr. Evelena Peat because of recurrent atrial fibrillation.  This morning he felt dizzy and light headed and went to Dr. Lucie Leather  office and was found to be in atrial fibrillation. His blood pressures have  been in the 80's to 90's. Mr. Difatta has had recurrent atrial fibrillation  that has been refractory to amiodarone and could not take flecainide, which  worked for a long time, because of development of coronary heart disease.  We have been titrating his sotalol and he did fairly well for a while on an  increased dose of 120 in the morning and 180 in the afternoon until he had  breakthrough and was hospitalized early this month. He then converted on his  own and has had mild episodes of palpitations recently. Since he had his  pacemaker implanted he is less sure of when he is in atrial fibrillation and  he is on rate control medications with the sotalol as well as digoxin.   PAST MEDICAL HISTORY:  1. Significant for coronary disease with an anterior wall infarction      treated with a stent in the LAD in 2005.  2. Diabetes.  3. Hyperlipidemia.   CURRENT MEDICATIONS:  1. Aspirin.  2. Nexium.  3. Warfarin.  4. Digoxin.  5. Lipitor.  6. Metformin.  7. Sotalol.   SOCIAL HISTORY:  His wife died from Alzheimer's in 10-24-2022.   PHYSICAL EXAMINATION:  VITAL SIGNS:  His blood pressure is 120/74 which is  up from Dr. Lucie Leather office. Pulse was 100 and irregular.  NECK:  There was no venous distention. Carotid pulses were full without  bruits.  CHEST:  Clear.  CARDIAC:  Heart  rhythm was regular, no murmurs or gallops.  ABDOMEN:  Soft without organomegaly.  EXTREMITIES:  Peripheral pulses were full and there was no peripheral edema.   We interrogated __________  On this examination he sounded regular. His  atrial fibrillation burden was about 4.9%.   IMPRESSION:  1. Recurrent atrial fibrillation despite sotalol 120 in the morning and      180 in the evening.  2. Borderline low blood pressure restricting titration of medications.  3. Intolerance to amiodarone and inability to take flecainide due to      coronary disease.  4. Coronary artery disease status post anterior wall myocardial infarction      treated with stenting in the left anterior descending.  5. Diabetes.  6. Hyperlipidemia.   RECOMMENDATIONS:  Mr. Wearing has recurrent atrial fibrillation despite  increasing dose of sotalol. In the past his creatinine's have been 2.1 and  toxin has not felt to have been a very good option.  His last creatinine  in  the hospital in July was down to 1.1. Will plan to get a BMP and digoxin  level today and I will plan to increase his digoxin from 1/2 of a 2.5 tablet  to a whole 2.5 tablet. Will get another digoxin level next week.  He may be  a candidate for either AV nodal ablation or atrial fibrillation ablation.  I  will arrange for him to see Dr. Ladona Ridgel regarding this. He seems to be more  symptomatic now from low blood pressure than from his atrial fibrillation  although there may be some correlation between the two. If we can obtain  better rate control with digoxin we may be able to manage him medically.   He has an appointment next week for follow up Myoview scan and I will see  him back at that time as well.                               Bruce [name], MD, [facility]    BRB/MedQ  DD:  12/09/2005  DT:  12/10/2005  Job #:  [id number]   cc:   Gloriajean Dell. Andrey Campanile, M.D.  Evelena Peat, M.D.

## 2010-08-13 NOTE — Discharge Summary (Signed)
NAME:  [name] [name], [name] [name]                         ACCOUNT NO.:  [id number]   MEDICAL RECORD NO.:  [id number]                   PATIENT TYPE:  INP   LOCATION:  3736                                 FACILITY:  [facility]   PHYSICIAN:  Charlton Haws, M.D.                  DATE OF BIRTH:  [birth year]   DATE OF ADMISSION:  12/08/2003  DATE OF DISCHARGE:  12/12/2003                           DISCHARGE SUMMARY - REFERRING   SUMMARY OF HISTORY:  [name] [name] is 77 year old white male who was  transported via EMS to [facility] Room secondary to palpitations  and chest discomfort.  He states that he has been doing extremely well until  approximately 6:40 this morning while riding a golf cart he noticed an  elevated heart rate associated with diaphoresis.  He stated that he had gone  into atrial fibrillation and this is his first episode in two years.  He  found a co-worker who drove him to a phone and EMS was called.  EMS report  is not available, but apparently, the patient developed chest discomfort  upon EMS arrival associated with shortness of breath and diaphoresis.  Post  arrival EMS patient went into ventricular fibrillation requiring  defibrillation.  The patient does recall an episode of approximately three  weeks ago where he did develop some shortness of breath and diaphoresis with  yard work.  His medical history is notable for paroxysmal atrial  fibrillation.  Tambocor was started in 2000.  Coumadin was discontinued  secondary to rectal bleeding.  He has a history of amiodarone intolerance  secondary to rash.  He also has a history of non-insulin-dependent, atypical  chest discomfort with a catheterization in 1986 did not show any coronary  artery disease.  Last Adenosine Cardiolite was in August 2004 with an EF of  67% without wall motion abnormalities or ischemia.   LABORATORY DATA:  Chest x-ray on the 12th of September did not show any  active disease.  Admission H&H was 13.9 and  41.0, normal indices, platelets  160, WBC 5.8. PT 12.7.  Sodium 139, potassium 3.7, BUN 23, creatinine 1.0,  glucose 225.  Normal LFTs.  Hemoglobin A1C was slightly elevated at 6.4.  Subsequent chemistries were unremarkable except for a slightly elevated  glucose in the 130s.  Initial CK total, MB was within normal limits and a  troponin was 0.05.  However, subsequent CK total rose to 1653 and 205.4  within eight hours of admission.  Subsequent CK-MBs were declining in  nature.  Troponins were not performed until the 14th.  This was 19.59.  Subsequent troponins were declining.  Fasting lipids on the 14th showed a  total cholesterol 129, triglycerides 104, HDL 46, LDL 62.  Subsequent  hematologies were essentially unremarkable.  However, on the morning of the  16th platelet count was 111.  Recheck of the platelet count showed 198.  His  H&H at  the time of discharge was 13.3 and 38.5 with a PT of 12.3 and INR  0.9.   HOSPITAL COURSE:  [name] [name] was taken emergently to the catheterization  laboratory.  Dr. Antoine Poche performed the procedure.  According to his note he  had a 30% ostial LAD, 100% proximal LAD, 40% distal circumflex prior to the  PL branch  _____________ 5% ostial OM1, 40% ostial OM2, 50% mid RCA with  some haziness.  PDA was large with tandem 25% lesions.  EF was 50% with  moderate anterior apical akinesis.  Dr. Juanda Chance performed Cypher stenting to  the LAD reducing the lesion from 100% to less than 10% restoring a TIMI 3  flow.  Post catheterization, catheterization site was intact per Lavella Hammock.  He has not had any further arrhythmias.  Cardiac rehabilitation  also began evaluating the patient to assist with education and ambulation.  By September 13 he stated that he was feeling much better; however, he has  had some difficulty sleeping.  Dr. Daleen Squibb transferred him out of the coronary  care unit for him to begin ambulation.  His medications were adjusted.  Diabetes  coordinator also saw the patient.  By September 14 patient was  doing well.  Dr. Andee Lineman saw the patient when he had an episode of atrial  fibrillation with a rapid rate of 170.  He received IV Lopressor x3 for rate  control.  Dr. Andee Lineman reviewed with Dr. Graciela Husbands as that his flecainide was  discontinued secondary to myocardial infarction and ventricular fibrillation  arrest.  Dr. Graciela Husbands saw him in consultation on December 10, 2003.  He felt  that the patient should be back on Coumadin.  He also felt that the patient  had mild QT prolongation and should not have any current antiarrhythmics at  this time.  Please refer to Dr. Odessa Fleming dictation.  Dr. Juanda Chance mentions that  Dr. Graciela Husbands stated that he should not start Tikosyn until his QT interval is  less than 440.  Thus, Dr. Juanda Chance planned to initiate rate control with an  increased beta blocker with a target DC of later in the week.  He felt that  he did not need a therapeutic INR prior to discharge.  Pharmacy on September  15 assisted with Coumadin plans.  Coumadin was started on September 15 with  5 mg.  On September 16 INR was 0.9 after one dose of Coumadin.  On September  16 Dr. Riley Kill felt that the patient could be discharged home; however, he  noted that his platelet counts were low.  These were repeated and platelet  counts were 198.  Thus, it is felt that he could be discharged home.   DISCHARGE DIAGNOSES:  1.  Acute anterior myocardial infarction associated with ventricular      fibrillation arrest.  2.  Coronary artery disease status post Cypher stenting to the proximal left      anterior descending with residual nonobstructive coronary artery disease      and an ejection fraction of 50%.  3.  Hyperglycemia with a history of diabetes and slightly elevated      hemoglobin A1C.  4.  Thrombocytopenia.  5.  Atrial fibrillation with rapid ventricular rate.  6.  Mild prolongation of the QT interval.  7.  History as  previously.  DISPOSITION:  [name] [name] is being discharged home.  His new medications  include  Plavix 75 mg daily for an unspecified amount of time, Lipitor 10 mg q.h.s.,  Lopressor 50 mg b.i.d., nitroglycerin 0.4 as needed, Coumadin 5 mg.  He is  instructed to take 5 mg daily between 4-6 p.m.  He was to receive  prescriptions to take him through the next two weeks.  He also received  prescriptions for mail in.  He was asked to continue his home medications  that include Nexium 40 mg daily, Allegra 180 daily, coated aspirin 81 mg  daily, Astelin as previously, Rhinocort as previously and to resume  Metformin 500 mg two tablets daily.  He was instructed not to take his  flecainide.  He was advised no lifting, driving, sexual activity or heavy  exertion until seen by the physician.  Maintain low salt, fat, cholesterol  ADA diet.  If he has any problems with his catheterization site he was asked  to call us immediately.  He was advised no pseudoephedrine products.  He  will have a PT/INR check on Monday at 11:30 a.m.  He will see Dr. Juanda Chance in  the office on October 5 at 4:15 p.m.  At the time of follow-up Dr. Juanda Chance  should be checking an EKG to determine QTC interval for possible initiation  of Tikosyn therapy.  He will also need arrangements for fasting lipids and  LFTs in approximately six to eight weeks.       EW/MEDQ  D:  12/12/2003  T:  12/12/2003  Job:  [id number]   cc:   Charlies Constable, M.D. [facility] M. Kriste Basque, M.D. [facility]

## 2010-08-13 NOTE — Telephone Encounter (Signed)
Pt daughter needs to have an orders for the tech to take his BP before taking his meds. Please fax to order [facility] attn: to nursing staff. Fax# [phone]. Pt daughter states she has called and spoke with whitney but nothing has been fax to [facility] yet.   Pt daughter would like to get a call once the order has been fax. Pt daughter Dois Davenport # [phone]

## 2010-08-13 NOTE — Discharge Summary (Signed)
NAMEETHANJAMES, [name]               ACCOUNT NO.:  [id number]   MEDICAL [name] NO.:  [id number]          PATIENT TYPE:  INP   LOCATION:  2007                         FACILITY:  [facility]   PHYSICIAN:  Theodore Demark, P.A. LHCDATE OF BIRTH:  [birth year]   DATE OF ADMISSION:  04/24/2004  DATE OF DISCHARGE:  04/26/2004                                 DISCHARGE SUMMARY   PROCEDURE:  None.   HOSPITAL COURSE:  [name] [name] is a 77 year old male with a history of  paroxysmal atrial fibrillation, who in the past has been on Flecainide, now  is on Sotolol.  He also had ventricular fibrillation in the setting of acute  coronary syndrome in September of 2005 and was resuscitated. He had a stent  to his LAD at that time and Flecainide was stopped.  Sotolol was started.   [name] [name] was awakened on the day of admission by rapid heart rate and  shortness of breath but no chest pain.  He came to the emergency room where  he was in atrial fibrillation with rapid ventricular response and was  treated with IV Cardizem.  He was evaluated by Learta Codding, M.D. and  admitted for further evaluation and management.   His Sotolol was increased from 80 mg twice daily to 120 mg twice daily.  His  Cardizem was discontinued.  His QTC was followed and on the day of discharge  was 0.476.  His TSH was within normal limits at 0.6.  His INR was followed  during the course of his hospital stay and he was therapeutic on that as  well.  His INR at discharge was 1.8 but he is to continue on his home dose  of Coumadin taking 5 mg today.  He is to follow up in the Coumadin clinic  and with Dr. Juanda Chance.   On April 25, 2004 [name] [name] converted to sinus rhythm.  Sotolol was  continued at increased dose.  QTC remained stable.  On April 26, 2004, Mr.  [name] was ambulating without chest pain or shortness of breath.  He was  considered stable for discharge with outpatient follow-up arranged.   DISCHARGE DIAGNOSES:  1.   Paroxysmal atrial fibrillation with rapid ventricular response on      Sotolol.  2.  Status post inferior wall myocardial infarction, September 2005 with      drug eluding stent to the left anterior descending.  3.  Mild left ventricular dysfunction with an ejection fraction of 50% by      cath in September.  4.  History of ventricular fibrillation secondary to myocardial infarction.  5.  Anticoagulation with aspirin 81 mg of Plavix for nine months and      Coumadin.  6.  Diabetes.  7.  Dyslipidemia.  8.  History of bradycardia.   DISCHARGE INSTRUCTIONS:  His activity level is to be as tolerated.  He is to  stick to a low fat diet.  He is to see the PA for Dr. Juanda Chance on Wednesday  February 15, at 10:30.  He is to follow up at the Coumadin clinic as  scheduled.   DISCHARGE MEDICATIONS:  1.  Cardizem 60 mg 1 by mouth daily as needed for tachy palpitations.  If      one Cardizem tablet does not keep his heart rate under control, he is to      call our office.  He is to check his blood pressure and heart rate and      call our office to see if he needs to come to the emergency room or take      a second Cardizem.  2.  Sotolol 120 mg twice daily.  3.  Aspirin 81 mg daily.  4.  Metformin 500 mg twice daily.  5.  Nexium 40 mg daily.  6.  Ambien as needed.  7.  Plavix 75 mg daily.  8.  Lipitor 40 mg daily.  9.  Coumadin 5 mg daily except for 2.5 mg two times a week.  10. Nitroglycerin sublingual as needed.      RB/MEDQ  D:  04/26/2004  T:  04/26/2004  Job:  [id number]   cc:   Charlies Constable, M.D. [facility]   [facility]

## 2010-08-13 NOTE — Op Note (Signed)
[name], [name] [name] NO.:  [id number]   MEDICAL RECORD NO.:  [id number]          PATIENT TYPE:  INP   LOCATION:  2009                         FACILITY:  [facility]   PHYSICIAN:  [name]. Ladona Ridgel, M.D.  DATE OF BIRTH:  [birth year]   DATE OF PROCEDURE:  02/15/2005  DATE OF DISCHARGE:                                 OPERATIVE REPORT   PROCEDURE PERFORMED:  Implantation of dual-chamber pacemaker.   SURGEON:  [name]. Ladona Ridgel, M.D.   INDICATIONS:  Symptomatic bradycardia.   INTRODUCTION:  The patient is an 77 year old man who was admitted to the  hospital with dizziness and with a history of paroxysmal atrial  fibrillation. He was found to have recurrent atrial fibrillation and  underwent initiation of Tikosyn therapy. Of note, the patient also had a GI  bleed. This eventually stabilized. He was initially tried on Tikosyn for  control of his atrial fibrillation, having been on sotalol in the past which  caused bradycardia. Unfortunately, Tikosyn did not control his atrial  arrhythmias and for this reason, he was switched back to sotalol which  resulted in symptomatic sinus bradycardia with heart rates in the high 30s  and low 40s. He is now referred for permanent pacemaker insertion.   PROCEDURE:  After informed consent was obtained, the patient is taken to the  diagnostic EP lab in the fasting state. After usual preparation and draping,  intravenous fentanyl and midazolam was given for sedation, and 30 cc of  lidocaine was infiltrated into the left infraclavicular region. A 5-cm  incision was carried out over this region, and electrocautery utilized to  dissect down to the fascial plane. Then, 10 cc of contrast was injected into  the left upper extremity venous system and demonstrated a patent left  subclavian vein. It was subsequently punctured x2, and the Medtronic Model  5076 58-cm, active fixation pacing lead, Serial number [id number] was  advanced into the right  ventricle. The Medtronic Model [id number], 52-cm active  fixation pacing lead,  Serial number [id number] was advanced to the right  atrium. Mapping was carried out in the right ventricle, and on the RV  septum, the R waves measured 28 mV, and the pacing impedance was 815 ohms  with a pacing threshold of 0.8 volts and 0.5 milliseconds. A 10-volt pacing  in this location did not dilate the diaphragm. With the ventricular lead in  satisfactory position, attention was then turned to placement of the atrial  lead. It was placed in the right atrial appendage where P waves measured 5  mV, and the pacing impedance was 493 ohms. The pacing threshold in the  atrium was 0.7 volts and 0.5 milliseconds and 10-volt pacing in the atrium  also did not stimulate the diaphragm. With these satisfactory parameters and  the leads actively fixed, they were secured to the subpectoralis fascia with  a figure-of-eight silk suture. The sew-in sleeve was also secured with silk  suture. Electrocautery was utilized to make a subcutaneous pocket. Kanamycin  irrigation was utilized to irrigate the pocket. Electrocautery was utilized  for hemostasis. The Medtronic Vitatron T60  60 DR dual-chamber pacemaker,  Serial number [id number] was connected to the atrial and ventricular pacing  leads and placed in the subcutaneous pocket. The generator was secured with  silk suture. Electrocautery was again utilized to assure hemostasis.  Kanamycin irrigation was  utilized to irrigate the pocket, and the incision  was closed with a layer of 2-0 Vicryl, followed by layer 3-0 Vicryl,  followed by a layer of 4-0 Vicryl. Benzoin was painted on the skin.  Steri-  Strips were applied, and a pressure dressing was placed. The patient was  returned to his room in satisfactory condition.   COMPLICATIONS:  There were no immediate procedural complications.   RESULTS:  Demonstrate successful implantation of Medtronic dual-chamber  pacemaker in a  patient with symptomatic bradycardia and tachy-brady  syndrome.           ______________________________  [name]. Ladona Ridgel, M.D.     GWT/MEDQ  D:  02/15/2005  T:  02/15/2005  Job:  [id number]   cc:   Charlies Constable, M.D. [facility]  [address]  [address]  [place]  [place] [address]   Ulyess Mort, M.D. [facility]  [address]  [place]  [place] [address]   Gloriajean Dell. Andrey Campanile, M.D.  Fax: [phone]

## 2010-08-13 NOTE — Discharge Summary (Signed)
[name], [name] [name] NO.:  [id number]   MEDICAL RECORD NO.:  [id number]          PATIENT TYPE:  INP   LOCATION:  2023                         FACILITY:  [facility]   PHYSICIAN:  Pricilla Riffle, M.D.    DATE OF BIRTH:  08-Sep-1922   DATE OF ADMISSION:  07/27/2005  DATE OF DISCHARGE:  07/29/2005                                 DISCHARGE SUMMARY   INDICATIONS:  The patient is an 77 year old who is undergoing cardioversion  for atrial fibrillation.  Underwent TEE to rule out thrombus prior.   The patient was sedated for anesthesia.  With pads in AP position, the  patient cardioverted to sinus rhythm with 200 joules synchronized biphasic  energy.  The patient, however, converted quickly.           ______________________________  Pricilla Riffle, M.D.     PVR/MEDQ  D:  08/04/2005  T:  08/05/2005  Job:  [id number]

## 2010-08-13 NOTE — Discharge Summary (Signed)
[name] [name], [name] [name]               ACCOUNT NO.:  [id number]   MEDICAL RECORD NO.:  [id number]          PATIENT TYPE:  INP   LOCATION:  3314                         FACILITY:  [facility]   PHYSICIAN:  Danae Chen, M.D.DATE OF BIRTH:  01-09-1923   DATE OF ADMISSION:  02/05/2005  DATE OF DISCHARGE:  02/12/2005                                 DISCHARGE SUMMARY   PRIMARY CARE PHYSICIAN:  Gloriajean Dell. Andrey Campanile, M.D.   CARDIOLOGIST:  [facility].   GASTROENTEROLOGIST:  Venita Lick. Russella Dar, M.D. [facility]   DISCHARGE DIAGNOSES:  1.  Upper gastrointestinal bleed.  2.  Paroxysmal atrial fibrillation.  3.  Diabetes.  4.  History of coronary artery disease.   DISCHARGE MEDICATIONS:  To be dictated at the time of actual discharge.  Currently, the patient is on sliding scale insulin, digoxin 0.25 mg p.o.  daily, Astelin nasal spray, magnesium oxide 400 mg p.o. daily, Protonix 40  mg p.o. daily, Lipitor 40 mg p.o. daily, Lopressor 15 mg p.o. t.i.d.,  Tikosyn 250 mcg p.o. b.i.d., Ambien 10 mg p.o. at night, p.r.n. Phenergan,  p.r.n. Tylenol, and p.r.n. morphine.   BRIEF HOSPITAL COURSE:  The patient is a pleasant 77 year old gentleman with  a prior history of coronary artery disease and paroxysmal atrial  fibrillation who was admitted for complaints of dizziness. Evaluation  revealed hemoccult positive stool and further evaluation revealed an upper  GI bleed. The patient had a recent endoscopy with esophageal dilatation on  the 7th of November. The exacerbation of the GI bleed more than likely  precipitated his atrial fibrillation. Both GI and cardiology were consulted.  The patient underwent esophagogastroduodenoscopy on February 06, 2005, per  Dr. Russella Dar and had blood clot that was clipped in the gastroesophageal  junction. Regarding his atrial fibrillation, the patient was started on beta  blocker  and Tikosyn, and at the time of discharge he was in normal sinus  rhythm with a QTC interval that  was within normal limits for being on the  Tikosyn which had __________ QTC interval. His diabetes was well controlled  during his hospital stay as well.   PERTINENT DISCHARGE LABORATORIES:  Potassium 4.0, BUN 11, creatinine 0.8,  hemoglobin 12.1.   PHYSICAL EXAMINATION:  At the time of dictation the patient is afebrile.  Vital signs are stable. Blood pressure 125/65, pulse 65 to 75, O2 saturation  98% on room air.  Lungs were clear bilaterally. Heart rate was regular with  normal S1 and S2. No peripheral edema. Abdomen was soft, nontender. He is  alert and oriented.   CONDITION ON DISCHARGE:  Improved.   FOLLOWUP:  Primary physician with Dr. Andrey Campanile in one to two weeks time and  follow-up with Dr. [facility] and [facility] as needed. Appointments to be  scheduled at the time of actual discharge.      Danae Chen, M.D.  Electronically Signed     RLK/MEDQ  D:  02/12/2005  T:  02/13/2005  Job:  [id number]

## 2010-08-13 NOTE — Assessment & Plan Note (Signed)
[facility]                              CARDIOLOGY OFFICE NOTE   NAME:Lukach, UNO ESAU                      MRN:          [id number]  DATE:10/31/2005                            DOB:          12-11-1922    ADDENDUM:  I discussed Mr. Bozard situation with [name]. Ladona Ridgel, MD,  today.  Will plan to increase Mr. Jastrzebski's sotalol dose from 120 mg b.i.d.  to 180 mg in the morning (1-1/2 tablets) and 120 mg in the evening.  We will  plan to bring him in the hospital for 48 hours to monitor his heart rhythm  and his ECG while we do this.  He is at some increased risk from arrhythmias  due to the fact that he has renal insufficiency with creatinine of 2.1 and  has coexisting coronary disease.  Tikosyn will probably not be a very good  option since he we will only be able to use very low doses with his renal  insufficiency.  He is intolerant to amiodarone and we cannot use flecainide  because of his coexisting coronary disease.  He is not sure when will be  best for him to come in, and [name] will try and arrange this sometime in the  next week.  I will be gone beginning August 8 for 8 days, and Mr. Mcglasson is  aware of this.                               Bruce [name], MD, [facility]    BRB/MedQ  DD:  10/31/2005  DT:  10/31/2005  Job #:  [id number]

## 2010-08-13 NOTE — Assessment & Plan Note (Signed)
[facility]                           ELECTROPHYSIOLOGY OFFICE NOTE   NAME:[name], [name] MACDONNELL                      MRN:          [id number]  DATE:12/27/2005                            DOB:          Aug 30, 1922    [name] is referred today by Dr. Charlies Constable for followup and  consideration for additional evaluation and treatment of atrial  fibrillation.   The patient is a very pleasant 77 year old man with a history of symptomatic  tachybrady syndrome who has had atrial fibrillation off and on for many  years.  He had to be stopped from his flecainide which worked very nicely in  controlling his arrhythmia secondary to the development of coronary disease  and in the past he had been intolerant to amiodarone.  The patient has been  on a fairly maximum dose of sotalol and despite this has had breakthrough  episodes of AFib.  He does note that when he goes into AFib he becomes  dizzy.  He does, however, state now compared to previous episodes of AFib  are associated with a slower ventricular response and in fact, all he really  gets is dizzy rather than chest pain, or shortness of breath, or  palpitations with his AFib.  He has had no syncope.  On review of his most  recent evaluation with his pacemaker demonstrated that his AFib burden was  5% of the time.  He has been maintained on sotalol at 300 mg daily in  divided doses.  He was recently seen by Dr. Juanda Chance and had his digoxin dose  increased to 0.25 daily.  He will be maintained on Coumadin.   PHYSICAL EXAMINATION:  GENERAL:  He is a pleasant elderly man in no  distress.  VITAL SIGNS:  Blood pressure 110/68, the pulse 68 and regular, the  respirations were 18, the weight was 148 pounds.  NECK:  Revealed no jugular venous distention.  LUNGS:  Clear bilaterally auscultation.  There were no wheezes, rales, or  rhonchi.  CARDIOVASCULAR:  Revealed a regular rate and rhythm with normal S1 and S2.  ABDOMEN:  Soft, nontender, nondistended.  There was no organomegaly.  EXTREMITIES:  Demonstrated no cyanosis, clubbing, or edema.   ECG from yesterday demonstrates atrial fibrillation with a controlled  ventricular response.   IMPRESSION:  1. Paroxysmal atrial fibrillation.  2. Coronary disease.  3. Tachybrady syndrome.  4. Chronic sotalol therapy.   DISCUSSION:  I have discussed treatment options with the patient and his  daughter in some detail.  These options essentially include continuation of  his sotalol, versus discontinuation of sotalol and AV node ablation, versus  AFib ablation.  Because of the patient's advanced age he is not a candidate  for AFib ablation.  Because his symptoms are really related to AFib and not  rate increases per se, then I have recommended that we hold off on AV node  ablation at the present time.  Rather I have recommended that we continue  him on sotalol 160 mg twice daily  and that we follow up his AFib burden in the next month.  Hopefully, down  the road he will be able to try our amiodarone like drug, called  _____________  .            ______________________________  [name]. Ladona Ridgel, MD     GWT/MedQ  DD:  12/27/2005  DT:  12/28/2005  Job #:  [id number]

## 2010-08-13 NOTE — Discharge Summary (Signed)
NAME:  [name] [name], [name] [name]               ACCOUNT NO.:  [id number]   MEDICAL RECORD NO.:  [id number]          PATIENT TYPE:  INP   LOCATION:  3710                         FACILITY:  [facility]   PHYSICIAN:  Charlies Constable, M.D. [facility] DATE OF BIRTH:  October 05, 1922   DATE OF ADMISSION:  01/02/2004  DATE OF DISCHARGE:  01/06/2004                           DISCHARGE SUMMARY - REFERRING   ADDENDUM:  Please note the patient did go home on Digoxin and this was dosed  at 0.25 mg daily.       LB/MEDQ  D:  02/24/2004  T:  02/24/2004  Job:  [id number]

## 2010-08-13 NOTE — H&P (Signed)
NAME:  [name] [name], [name] [name]               ACCOUNT NO.:  [id number]   MEDICAL RECORD NO.:  [id number]          PATIENT TYPE:  EMS   LOCATION:  MAJO                         FACILITY:  [facility]   PHYSICIAN:  Mobolaji B. Bakare, M.D.DATE OF BIRTH:  October 10, 1922   DATE OF ADMISSION:  02/05/2005  DATE OF DISCHARGE:                                HISTORY & PHYSICAL   CHIEF COMPLAINT:  Dizziness for one week.   GASTROENTEROLOGIST:  Dr. Victorino Dike.   HISTORY OF PRESENTING COMPLAINT:  [name] [name] is a pleasant 77 year old  Caucasian male with history of paroxysmal atrial fibrillation, on chronic  anticoagulation, myocardial infarction in 2005.  He was in his usual state  of health until eight days ago when he started experiencing dizzy spells.  He was seen in Dr. Tawana Scale office by Dr. Arlyce Dice who recommended reducing  dose of Sotalol after discussion with Dr. Juanda Chance.  He felt somewhat better  and he was able to tolerate esophageal dilatation done last Tuesday, four  days ago.  However, today he began really dizzy but did not pass out.  He  came to the emergency room and was found to have lowish blood pressure 99/67  with a pulse rate of 77.  While he was examined, he a large BM which was  dark stool.  Stool Hemoccult was positive.  Hemoglobin was 11.1 with a  hematocrit of 32.5.  Prior hemoglobin and hematocrit in June, 2006 were 13.8  and 41.1 respectively.  He was given IV fluid challenge and blood pressure  is 116/60.  He is currently in normal sinus rhythm.   He denies syncope, abdominal pain, no diarrhea, no vomiting, no nausea.  He  does feel his abdomen pulsating.  This necessitated a CT scan of the abdomen  to evaluate for aneurysm by the ER physician and this turned out to be  negative.   The patient's INR is 1.4 within normal limits but pro time is 16.9.   [name] [name] had an upper endoscopy four days ago for esophageal dilatation.  I do not have report of this procedure in the E-chart.  It  was done as  outpatient.  It was noted.   REVIEW OF SYSTEMS:  No fever, chills, rigors, cough, chest pain, dyspnea, no  difficulty with micturition, no dysuria.   PAST MEDICAL HISTORY:  1.  CAD.  He is status post inferior myocardial infarction in September,      2005.  He received drug eluting stent to the left anterior descending      artery.  2.  Paroxysmal atrial fibrillation, on chronic anticoagulation.  He was in      atrial fibrillation when he saw Dr. Benedetto Goad in the office last      Friday.  3.  History of ventricular fibrillation in the setting of acute coronary      syndrome.  4.  Diabetes mellitus.  5.  Dyslipidemia.  6.  History of bradycardia.  7.  History of bladder cancer more than twenty years ago.  He received      intrabladder chemotherapy.   PAST SURGICAL  HISTORY:  1.  Resection of bladder tumor more than twenty years ago.  2.  Inguinal hernia repair in the  70's.   MEDICATIONS:  1.  Coumadin 5 alternating with 2.5 mg q. day.  2.  Aspirin 81 mg q. day.  3.  Ambien 10 mg q.h.s.  4.  Lipitor 40 mg q.h.s.  5.  Metformin 500 mg p.o. b.i.d.  6.  Metoclopramide 10 mg q.h.s.  7.  Nexium 40 mg p.o. q. day.  8.  Nitro-Quick 0.4 mg p.r.n.  9.  Ranitidine 300 mg q.h.s.  10. Sotalol 40 mg q. 12 hours.  11. Diltiazem 60 mg one p.r.n. for palpitations.  12. Digoxin 0.25 mg q. day.  13. Astelin nasal spray two sprays each nostril q. 12 hours.   ALLERGIES:  Sulfa gives him rash.   FAMILY HISTORY:  No family history of colon cancer.  Mother died of MI at  the age of 60.  Father died at the age of 18 of old age and was in a nursing  home.   SOCIAL HISTORY:  He is married and lives with his wife.  He quit smoking  more than sixty years ago and does not drink alcohol.  He is independent  with activities of daily living.   PHYSICAL EXAMINATION:  VITAL SIGNS:  Current vitals:  Temperature 98.5,  blood pressure 113/69, pulse 66, O2 sats pending.  GENERAL:  The  patient is alert and oriented to time, place and person,  appears pale, not icteric.  HEENT:  Normocephalic, atraumatic head.  Extraocular movements intact.  Pupils equal, round, reactive to light.  Mucous membranes moist.  No  elevated JVD.  No thyromegaly.  LUNGS:  Clear to auscultation.  CARDIOVASCULAR:  S1, S2, systolic ejection murmur, rhythm regular.  ABDOMEN:  Not distended, mild epigastric tenderness, no rebound, bowel  sounds present, no palpable organomegaly.  EXTREMITIES:  No pedal edema, no calf tenderness.  Dorsalis pedis pulses 2+  bilaterally.  CNS:  No focal neurological deficits.  SKIN:  No rash, no petechiae.   INITIAL LABORATORY DATA:  Hemoglobin 11.1, hematocrit 32.5, white cells 4.9,  platelets 162, neutrophils 63%, lymphocytes 22%, monocytes 7%.  PT 16.9, INR  1.4, PTT 30, lipase 33, bilirubin 1.5, indirect 1.2, total protein 5.6,  albumin 3.2, AST 17, ALT 13, alkaline phosphatase 43, digoxin level 1.2,  sodium 141, potassium 4.8, chloride 106, glucose __________, BUN 53,  creatinine 0.7.  __________ positive.  UA unremarkable.  EKG shows normal  sinus rhythm, normal axis, heart rate 73 beats per minute in inferior leads.  Imaging studies:  Chest x-ray showed no acute cardiopulmonary disease.  CT  abdomen and pelvis showed no aneurysm, atrial sclerosis and calcification  with more plaque in aorta, focal ostial narrowing of inferior mesenteric  artery, double left renal arteries, degenerative disk disease involving the  lower lumbar spine with grade 1 anterolisthesis of L3-L4 and L4-5 with  biforaminal narrowing of L4-5, multiple punctate calcifications of left  kidney without hydronephrosis.   ASSESSMENT/PLAN:  1.  [name] [name] is an 77 year old Caucasian male presenting with dizziness,      hypotension and dark stool.  He has a GI bleed and I suspect this is     most likely upper GI though CT scan of abdomen shows focal ostial      narrowing of inferior  mesenteric artery.  However, he does not have any      abdominal pain to suggest mesenteric ischemia.  He  had a colonoscopy      done about three years ago which found a polyp. I will admit to step      down, type and cross match 3 units of packed red blood cells, will      transfuse to keep hemoglobin greater than 10 grams per dL, check H and H      q. 8, start Protonix 40 mg IV q. 12 hours, hold Coumadin and aspirin.  2.  In view of elevated pro time, I will give vitamin K 2 mg subcu times one      now, check PT/INR in a.m.  Give IV fluids normal saline at 125 cc per      hour.  Will keep patient n.p.o.  3.  Paroxysmal atrial fibrillation.  Will hold Sotalol for now due to      relative hypotension.  Will continue digoxin.  The patient is currently      in sinus rhythm and rate is in the 70's.  4.  CAD.  5.  Dyslipidemia.  6.  Diabetes mellitus type 2.  Will hold Metformin while n.p.o. and place on      sliding scale insulin with NovoLog.  7.  Will consult GI in the morning.      Mobolaji B. Corky Downs, M.D.  Electronically Signed     MBB/MEDQ  D:  02/05/2005  T:  02/05/2005  Job:  [id number]   cc:   Ulyess Mort, M.D. [facility]  [address]  [place]  [place] [address]   Gloriajean Dell. Andrey Campanile, M.D.  Fax: [phone]

## 2010-08-13 NOTE — Assessment & Plan Note (Signed)
[facility]                            CARDIOLOGY OFFICE NOTE   NAME:Keadle, GAR GLANCE                      MRN:          [id number]  DATE:04/10/2006                            DOB:          [birth year]    PRIMARY CARE PHYSICIAN:  Dr. Benedetto Goad in [place].   Barnetta Hammersmith returned for a followup visit for management of his atrial  fibrillation and coronary heart disease.  He has had recurrent atrial  fibrillation and has been intolerant to amiodarone due to a rash and was  unable to take flecainide due to development of coronary disease and it  has been managed with sotalol and now a pacemaker.  When I saw him last  time, he was having episodes of break through atrial fibrillation  associated with some symptoms of dizziness.  He saw Lewayne Bunting last  November and was in atrial fibrillation about 90% of the time, according  to his note.   He also has coronary heart disease and had an anterior wall myocardial  infarction which was treated with stenting of the LAD.  His LV function  has been good with an ejection fraction of greater than 55% by  echocardiogram in July.   He says now he is having symptoms of shortness of breath when he goes  down to get the mail and walks back up a hill to his house.  He thinks  this may be worse over the last couple of months.  He also indicated  that he has episodes of dizziness which may occur about once a week.  These occur most often when he lies down and stands up.   His past medical history is significant for diabetes, hyperlipidemia and  renal insufficiency. His renal insufficiency had improved.   His current medications include:  1. Aspirin.  2. Nexium.  3. Warfarin.  4. Digoxin.  5. Lipitor.  6. Metformin.  7. Sotalol 150 mg b.i.d.   EXAMINATION:  Today the blood pressure is 113/69, the pulse 79 and  regular.  There is no venous distention.  The carotid pulses were full without  bruits.  CHEST:  Was clear without rales or rhonchi.  The cardiac rhythm was regular.  The heart sounds were normal.  I could  heard no murmurs, rubs or gallops.  ABDOMEN:  Was soft with normal bowel sounds.  Peripheral pulses were full.  There was no peripheral edema.   An electrocardiogram showed atrial pacing with intrinsic ventricular  conduction.  The QTC was 418 milliseconds.   We interrogated the pacemaker and he was in atrial fibrillation only 6%  of the time.  He had intrinsic ventricular beats almost 100% of the time  with the programmed AV delay of 275.   IMPRESSION:  1. Paroxysmal atrial fibrillation, partially controlled on sotalol and      digoxin.  2. Status post DD pacemaker implantation with good pacer function.  3. History of intolerance to AMIODARONE and inability to take      FLECAINIDE.  4. Coronary artery disease status post anterior wall  myocardial  infarction with stenting of the left anterior descending.  5. Diabetes.  6. Hyperlipidemia.   RECOMMENDATIONS:  Overall I think Mr. Drapeau is doing reasonably well.  His atrial fibrillation is small and the sotalol appears to be helping.  His blood pressure appears to be tolerating it at present.  We will plan  to continue his current medications.  His last digoxin level was  elevated and we will get a repeat digoxin level as well as a fasting  lipid and liver, a CBC and BMP on him later this week.  If his dyspnea  on exertion worsens we will evaluate him with a Myoview scan for a  possible ischemic equivalent.  Otherwise I will see him back in 4  months.     Bruce [name], MD, [facility]  Electronically Signed    BRB/MedQ  DD: 04/10/2006  DT: 04/10/2006  Job #: [id number]   cc:   Gloriajean Dell. Andrey Campanile, M.D.

## 2010-08-13 NOTE — H&P (Signed)
NAME:  [name] [name], [name] [name] NO.:  [id number]   MEDICAL RECORD NO.:  [id number]          PATIENT TYPE:  INP   LOCATION:  1824                         FACILITY:  [facility]   PHYSICIAN:  Jonelle Sidle, M.D. LHCDATE OF BIRTH:  [birth year]   DATE OF ADMISSION:  07/27/2005  DATE OF DISCHARGE:                                HISTORY & PHYSICAL   REASON FOR ADMISSION:  Recurrent atrial fibrillation.   HISTORY OF PRESENT ILLNESS:  [name] is a pleasant 77 year old male with  a history of coronary artery disease status post previous anterior wall  myocardial infarction managed with drug eluting stent placement to the left  anterior descending with, otherwise, mild to moderate residual disease and  ejection fraction of 50%.  In addition, he has paroxysmal atrial  fibrillation with tachycardia/bradycardia syndrome and is status post  placement of a DDD pacemaker in November 2006.  He has been managed with a  variety of anti-arrhythmics over the years which he did not tolerate and  Tambocor which he did tolerate, although he had to discontinue following his  myocardial infarction. Most recently, he has been on Sotalol with doses  ranging from  40 mg p.o. b.i.d. to 120 mg p.o. b.i.d.  He is not entirely  clear as to why the doses have changed over time, although it does look like  bradycardia has been a limiting factor and, in fact, necessitated the  placement of his pacemaker.  There may have also been some problems with QT  prolongation, although I am not entirely certain about this.  He saw Dr.  Juanda Chance back in February of this year at which time his correct QT interval  on Sotalol 80 mg p.o. b.i.d. was 392 milliseconds.  He does not report any  problems with paroxysmal atrial fibrillation at least in the last 6-8  months.  This morning, he states he suddenly developed a feeling of tachy  palpitations followed by light headedness, diaphoresis, and general malaise,  although he  experienced no chest pain or shortness of breath.  He was sent  to the emergency department and on my evaluation now remains in atrial  fibrillation with heart rates in the 90 to 100 range and is, otherwise,  hemodynamically stable, although he did present with a systolic blood  pressure as low as 80 but now up into the 110 to 120 range.  He states that  he is more comfortable and has been compliant with his medications including  Coumadin.  We received Coumadin records, I am showing recent INR levels have  been subtherapeutic and noted to be 1.4 in late March and 1.2 in early  April.  Presently, his INR is 2.2.   ALLERGIES:  AMIODARONE intolerance and SULFA DRUGS.   PRESENT MEDICATIONS:  1.  Coumadin 2.5 mg alternating with 5 mg q.o.d.  2.  Aspirin 81 mg p.o. daily.  3.  Lipitor 40 mg p.o. daily.  4.  Metformin 500 mg p.o. daily.  5.  Nexium 40 mg p.o. daily.  6.  Sotalol 80 mg p.o. b.i.d.  7.  Digoxin  0.25 mg p.o. daily.  8.  Effexor 75 mg p.o. daily.  9.  P.r.n. Cardizem 60 mg (has not used this recently).   PAST MEDICAL HISTORY:  As outlined in the history of present illness.  There  is an additional history of type 2 diabetes mellitus, hyperlipidemia, and  upper gastrointestinal hemorrhage in November 2006.   SOCIAL HISTORY:  The patient is married and lives in [place].  He works  Systems developer at [employer].  There is no significant tobacco or alcohol use  history.  His wife has dementia and is presently residing in a nursing home.   FAMILY HISTORY:  Significant for premature cardiovascular disease.   REVIEW OF SYMPTOMS:  As described in the history of present illness.  He  states he has been stable other than the recent symptoms.   PHYSICAL EXAMINATION:  VITAL SIGNS:  The patient is afebrile, heart rate in the mid 90s in atrial  fibrillation, respirations 20, systolic blood pressure most recently in the  110 to 120 range, oxygen saturation is 96% on room air.  GENERAL:   This is a pleasant elderly male in no acute distress.  HEENT:  Conjunctivae and lids are normal.  Oropharynx clear.  NECK:  Supple without elevated jugular venous pressure.  LUNGS:  Generally clear without labored breathing.  HEART:  Irregularly irregular rhythm without S3 gallop pericardial rub.  There is a soft basal systolic murmur.  ABDOMEN:  Soft, without hepatomegaly, no bruits.  EXTREMITIES:  No significant pitting edema.  Peripheral pulses are 1+.   LABORATORY DATA:  Chest x-ray is currently pending.  12 lead  electrocardiogram today shows atrial fibrillation at 97 beats per minute  with nonspecific ST-T wave changes.  Correct QT interval is 391  milliseconds.  Hemoglobin 13.6, hematocrit 40.  Sodium 139, potassium 4.8,  BUN 27, creatinine 1, magnesium 1.9, CK 44, CK MB 1, troponin I 0.02.  Digoxin is 1.7.  Urinalysis is clear.   IMPRESSION:  1.  Recurrent atrial fibrillation with known history of paroxysmal atrial      fibrillation managed with Coumadin and status post DDD pacemaker      placement in November 2006 secondary to tachycardia-bradycardia      syndrome.  The patient has been treated with Sotalol most recently at 80      mg p.o. b.i.d.  He is currently also on digoxin with a level of 1.7.      Correct QT interval at this point is approximately 390 milliseconds.  He      has had intolerances to Amiodarone and was taken off Flecainide in the      past due to coronary artery disease and myocardial infarction.  I am not      certain whether he has been on Tikosyn at any point, but my      understanding is that he may have had some problems with QT prolongation      in the past.  This is apparently his first symptomatic event in the last      6-8 months.  His Coumadin is presently therapeutic, although it has not      been therapeutic over the last two checks at his primary care      physician's office. 2.  Coronary artery disease status post previous anterior wall  myocardial      infarction status post placement of a drug eluting stent to the left      anterior descending in 2005 as  outlined above.  Ejection fraction is in      the 50% range.   PLAN:  1.  The patient will be admitted to telemetry.  We will cycle cardiac      markers.  2.  Will continue home medications except to hold digoxin for now, most      likely reinstitute a lower dose.  Will also plan to give a single dose      of Cardizem 60 mg p.o. as he has used in the past to see if this,      perhaps, helps to precipitate his spontaneous conversion to sinus      rhythm.  We have, otherwise, tentatively scheduled a TEE guided      cardioversion for tomorrow if his atrial fibrillation persists.  A more      pressing question will be whether he will needs to change an anti-      arrhythmic therapy, either an increase in his Sotalol to 120 mg p.o.      b.i.d. or, perhaps, change to another preparation.  I am not certain if      he was decreased from Sotalol 120 mg p.o. b.i.d. to      80 mg p.o. b.i.d. due to QT prolongation as used in the past or whether      this may have just been due to bradycardia which will now not be an      issue given his pacemaker.  3.  We will have Dr. Juanda Chance review the situation and can make plans from      there.           ______________________________  Jonelle Sidle, M.D. [facility]     SGM/MEDQ  D:  07/27/2005  T:  07/27/2005  Job:  [id number]   cc:   Charlies Constable, M.D. [facility]  [address]  [address]  [place]  [place] [address]   Gloriajean Dell. Andrey Campanile, M.D.  Fax: [phone]

## 2010-08-13 NOTE — Discharge Summary (Signed)
[name] [name], FALLERT NO.:  [id number]   MEDICAL RECORD NO.:  [id number]          PATIENT TYPE:  INP   LOCATION:  2009                         FACILITY:  [facility]   PHYSICIAN:  Michaelyn Barter, M.D. DATE OF BIRTH:  04-20-1922   DATE OF ADMISSION:  02/05/2005  DATE OF DISCHARGE:  02/16/2005                                 DISCHARGE SUMMARY   This is a discharge dictation that follows the discharge summary that was  dictated by Dr. Danae Chen on February 12, 2005.  Please see Dr.  Jaynee Eagles dictation for events that led up to today.   FINAL DIAGNOSES:  1.  Upper gastrointestinal bleed.  2.  Paroxysmal atrial fibrillation.  3.  Diabetes mellitus.   PROCEDURE:  Dual-chamber pacemaker completed on February 15, 2005.   HOSPITAL COURSE:  Again, for a complete hospital course please see that  which was dictated by Dr. Ulyess Mort. The events that I am about to refer  take place over the course of February 13, 2005, up until February 16, 2005.   Problem #1: Arrhythmia.  [facility] followed the patient over the  course of his hospitalization. The patient had been placed on Tikosyn  secondary to his atrial fibrillation, however he continued to have  breakthrough atrial fibrillation with rapid ventricular response. On  November 21st it was noted that the patient had developed tachy-brady  syndrome therefore his medications, Cardizem and Lopressor, were  discontinued. EP was consulted and the decision was made to implant a dual-  chamber pacemaker, performed by Dr. Lewayne Bunting.   Probem #2: Upper GI bleed.  His hemoglobin and hematocrit have been followed  very closely over the past three to four days and they have remained stable.  The patient has not complained of any GI related symptoms. The patient's  Coumadin was stopped during the earlier portion of his hospitalization. This  medication will have to be re-started following his discharge from  the  hospital.   CONDITION AT THE TIME OF DISCHARGE:  Improved. Currently, the patient denies  having any shortness of breath, any abdominal pain. He states that he feels  much better and wants to go home.   DISCHARGE MEDICATIONS:  1.  Sotalol 80 mg daily.  2.  Lipitor 40 mg q.h.s.  3.  Enteric-coated aspirin 81 mg daily.   In addition,the patient will be instructed to follow up with Dr. Juanda Chance and  also to see Dr. Benedetto Goad within the next one to two weeks. Likewise, he  is instructed to follow up with the pacer clinic on Monday, December 4th at  9:45 a.m.; to see Dr. Juanda Chance on Wednesday, December 20th at 10:30 a.m. and  also on February 1st at 10:30 a.m. He is also to follow up in the Coumadin  Clinic on Tuesday at 11:30 a.m.      Michaelyn Barter, M.D.  Electronically Signed     OR/MEDQ  D:  02/16/2005  T:  02/16/2005  Job:  [id number]   cc:   Danae Chen, M.D.   Charlies Constable, M.D. [facility]  [address]  [address]  [address]  [place]  [place] [address]   Venita Lick. Russella Dar, M.D. [facility]  [address]  [place]  [place] [address]

## 2010-08-13 NOTE — Discharge Summary (Signed)
[name] [name], [name] [name]               ACCOUNT NO.:  [id number]   MEDICAL RECORD NO.:  [id number]          PATIENT TYPE:  INP   LOCATION:  2023                         FACILITY:  [facility]   PHYSICIAN:  Charlies Constable, M.D. [facility] DATE OF BIRTH:  April 06, 1922   DATE OF ADMISSION:  07/27/2005  DATE OF DISCHARGE:  07/29/2005                                 DISCHARGE SUMMARY   PRIMARY CARDIOLOGIST:  Charlies Constable, M.D. [facility].   PRIMARY CARE PHYSICIAN:  Gloriajean Dell. Andrey Campanile, M.D.   PRINCIPAL DIAGNOSIS:  Recurrent atrial fibrillation.   OTHER DIAGNOSES:  1.  Coronary artery disease, status post ventricular fibrillation arrest, in      September 2005, with successful placement of a CYPHER drug-eluting stent      in the left anterior descending artery.  2.  Chronic anticoagulation.  3.  History of tachy-brady syndrome, status post Medtronic dual-chamber      permanent pacemaker.  4.  History of upper gastrointestinal bleed, November 2006.  5.  History of a bladder tumor, status post resection.  6.  Type 2 diabetes mellitus.  7.  Hyperlipidemia  8.  Gastroesophageal reflux disease.   ALLERGIES:  1.  AMIODARONE.  2.  SULFA.   PROCEDURES:  Transesophageal echocardiogram with cardioversion.   HISTORY OF PRESENT ILLNESS:  An 77 year old white male with a prior history  of CAD and paroxysmal atrial fibrillation with previous intolerance to  amiodarone.  He also had been treated with Tambocor in the past which was  discontinued secondary to a diagnosis of coronary disease.  He was in his  usual state of health until Jul 27, 2005, when he had a sudden onset of tachy  palpitations, followed by lightheadedness, diaphoresis, and general malaise.  He presented to the [facility] ED, where he was noted to be in atrial  fibrillation with rates of 90-100 but was otherwise hemodynamically stable.  He was admitted for further evaluation.   HOSPITAL COURSE:  Decision was made to increase his sotalol from 80 mg  b.i.d.  to 120 mg b.i.d.  He underwent a TEE, on Jul 28, 2005, revealing no  left atrial or left atrial appendage thrombus with grossly normal LV  function.  He then underwent successful cardioversion with a 200 joules  synchronized biphasic shock which successfully converted him to a sinus  rhythm with pacing __________  .  This morning he has been ambulating  without difficulty.  He has not had any recurrent atrial fibrillation  overnight and his QTC is stable at 380 msec.   He is being discharged home today in satisfactory condition.   DISCHARGE LABORATORY:  Hemoglobin 14.0, hematocrit 40.5, WBC 3.1, platelets  211, MCV 97.0.  Sodium 138, potassium 3.9, chloride 106, CO2 24, BUN 17,  creatinine 1.2, glucose 130.  Total bilirubin 0.6, alkaline phosphatase 25,  AST 24, ALT 13, albumin 3.2.  CK 75, MB 3.3.  Total cholesterol 173,  triglycerides 285, HDL 28, LDL 88.  Calcium 9.0.  TSH 2.107.   DISPOSITION:  The patient is being discharged home today in good condition.   FOLLOWUP PLANS AND  APPOINTMENTS:  1.  He has a followup appointment with Dr. Charlies Constable on Aug 08, 2005 at 4      p.m..  2.  He is asked to follow up with his primary care physician, Dr. Benedetto Goad, within the next three to four weeks.   DISCHARGE MEDICATIONS:  1.  Aspirin 81 mg every day.  2.  Lipitor 40 mg every day.  3.  Metformin 500 mg every day.  4.  Nexium 40 mg every day.  5.  Coumadin as directed.  6.  Sotalol 120 mg b.i.d.  7.  Digoxin 0.125 mg every day.  8.  Astelin nasal spray 2 sprays every day.  9.  Metoclopramide 10 mg every day.  10. Nitroglycerin 0.4 mg sublingual p.r.n. chest pain.  11. Zantac 300 mg q.h.s.  12. Effexor 75 mg every day.  13. Tramadol 50 mg b.i.d.  14. Diltiazem 60 mg p.r.n. tachy palpitations.   Outstanding lab studies:  None.   Duration discharge encounter:  Forty minutes including physician time.      Ok Anis, NP    ______________________________   Charlies Constable, M.D. [facility]    CRB/MEDQ  D:  07/29/2005  T:  07/29/2005  Job:  [id number]   cc:   Gloriajean Dell. Andrey Campanile, M.D.  Fax: [phone]

## 2010-08-13 NOTE — Discharge Summary (Signed)
[name] [name], [name] [name]               ACCOUNT NO.:  [id number]   MEDICAL RECORD NO.:  [id number]          PATIENT TYPE:  INP   LOCATION:  3710                         FACILITY:  [facility]   PHYSICIAN:  Charlies Constable, M.D. [facility] DATE OF BIRTH:  Jun 15, 1922   DATE OF ADMISSION:  01/02/2004  DATE OF DISCHARGE:  01/06/2004                           DISCHARGE SUMMARY - REFERRING   DISCHARGE DIAGNOSES:  1.  Paroxysmal atrial fibrillation now in normal sinus rhythm.  2.  Long-term sotalol use.  3.  Coronary artery disease.  4.  Ejection fraction 50%.  5.  Diabetes mellitus non-insulin dependent.  6.  Hypertension, treated.   HOSPITAL COURSE:  [name] [name] is a 77 year old male patient who awoke on the  morning of admission with an increased heart rate.  This was then associated  with left-sided chest pain without radiation, nausea, vomiting, shortness of  breath.  He did have some diaphoresis.  He took a sublingual nitroglycerin  with relief.  He then called EMS and was noted to be in atrial fibrillation  at that time, this was associated with dizziness and he denied any further  chest pain after that sublingual nitroglycerin.   By the time he arrived to the emergency room he was in normal sinus rhythm.  He was started on sotalol.  He does have a known history of paroxysmal  atrial fibrillation and had been on Tambocor in the past.  He had also been  on amiodarone, but was intolerant to this secondary to rash.  He has been on  Coumadin in the past, but this was stopped secondary to rectal bleeding.   By January 06, 2004 the patient was felt to be ready for discharge to home.  He was discharged on sotalol, plus Digoxin, but the metoprolol that he came  into the hospital on, was stopped.   Please note that although the patient had been on Coumadin in the past and  had rectal bleeding with this, he had been restarted on the Coumadin and had  no further problems.   By discharge the patients labs  showed an INR of 2.7, BUN 24, creatinine 1.1,  potassium 4.5, QTC was less than 500.   The patients discharge medications include sotalol 80 mg p.o. b.i.d.  The  patient is to stop the home metoprolol, otherwise continue same home  medications which include:  1.  Baby aspirin daily.  2.  Glucophage 500 mg 2 tablets daily.  3.  Nexium 40 mg a day.  4.  Ambien 10 mg 1 tablet q.h.s.  5.  Plavix 75 mg a day.  6.  Lipitor 10 mg 4 tablets q.h.s.  7.  Warfarin 5 mg daily, except 2.5 mg on Wednesday and Friday.  8.  Sublingual nitroglycerin p.r.n. chest pain.  9.  Rhinocort.  10. Astelin.       LB/MEDQ  D:  02/24/2004  T:  02/24/2004  Job:  [id number]   cc:   Lonzo Cloud. Kriste Basque, M.D. [facility]

## 2010-08-13 NOTE — Consult Note (Signed)
[name] [name], [name] [name] NO.:  [id number]   MEDICAL RECORD NO.:  [id number]          PATIENT TYPE:  INP   LOCATION:  2630                         FACILITY:  [facility]   PHYSICIAN:  [name] [name], P.A. [facility] DATE OF BIRTH:  15-Jun-1922   DATE OF CONSULTATION:  02/06/2005  DATE OF DISCHARGE:                                   CONSULTATION   Primary cardiology:  Dr. Juanda Chance.  Primary care physician:  [facility], Dr. Andrey Campanile.   SUMMARY OF HISTORY:  [name] [name] is an 77 year old white male who is admitted  through the emergency room secondary to a GI bleed associated with  hypotension.  [name] [name] states that on January 05, 2005, endoscopy was  arranged by [name] [name], M.D., secondary to dysphagia and increased  GERD.  This was performed without difficulty on February 01, 2005, with the  findings of a hiatal hernia, esophageal stricture and chronic gastritis.  Dilatation was performed with minimal resistance.  Post procedure the  patient was discharged home.  [name] [name] states that since that procedure he  has not had any problems with dysphagia and his GERD has improved; however,  on Saturday morning upon awakening, he noticed dizziness especially with  position changes.  He states that any time he would stand up he would have  to grab hold of something to avoid falling.  He did not have any falls or  syncope.  He checked his blood pressure and it was 90/60.  He denied any  associated chest discomfort or palpitations.  He did describe some  diaphoresis and shortness of breath.  He thought that he was in atrial  fibrillation and that is why he was feeling so dizzy; thus, he called EMS.  However, upon EMS arrival they told him he was in a sinus rhythm but his  blood pressure was very low.  EMS report is not available at the time of  this dictation.  He was brought to the emergency room and has been waiting  in the emergency room approximately 25 hours for a bed  to open up.  During  this time his initial H&H dropped three points, and he has received two  units of packed RBCs in transfusion.  Associated with this was hypotension  with a systolic pressure in the 60s.  Also, throughout the night and today  he has had some atrial fibrillation with a rapid ventricular rate.  It is  difficult to assess from the ER note specifically the duration of the atrial  fibrillation yesterday evening and today or if it was specifically  associated with his hypotension.  At this time [name] [name] has been  transferred to the 2600 unit and states that he feels better.  At this time  he is in a normal sinus rhythm.   PAST MEDICAL HISTORY:  Allergies include SULFA, amiodarone intolerance.   Medications prior to admission include:  1.  Nexium 40 mg daily.  2.  Digoxin 0.25 mg daily.  3.  Diltiazem 60 mg p.r.n.  4.  Metformin 500 mg b.i.d.  5.  Nitroglycerin 0.4 mg p.r.n.  6.  Zantac 300 mg q.h.s.  7.  Lipitor 40 mg q.h.s.  8.  Metoclopramide 10 mg q.h.s.  9.  Aspirin 81 mg daily.  10. Ambien 10 mg daily.  11. Sotalol 40 mg b.i.d.  12. Astelin nasal spray two sprays each nostril b.i.d.  13. Recently his Coumadin was discontinued on January 27, 2005, in      anticipation of endoscopy.  His Coumadin was resumed on February 03, 2005, unknown dosage.   His medical history is notable for coronary artery disease with an anterior  myocardial infarction in September 2005.  His anterior myocardial infarction  was associated with a ventricular fibrillation arrest.  He underwent cardiac  catheterization, and these records are pending at the time of this  dictation, and underwent drug-eluting stent to the LAD.  According to  records available, his EF was 50% at that time.   Past medical history is also notable noninsulin-dependent diabetes,  hyperlipidemia, unknown last check paroxysmal atrial fibrillation.  His  flecainide was discontinued in January 2005 secondary  to the myocardial  infarction and ventricular fibrillation.  Since that time he has been  maintained on sotalol; however, this has been adjusted per Dr. Andrey Campanile and  per Dr. Juanda Chance to his current dose.  He also has a history of chronic sinus  bradycardia.  There has been mention in the past office notes of possible  pacer.  He continues to have atrial fibrillation with a rapid ventricular  rate so that in order to control his rate that his medications might be  increased.   SOCIAL HISTORY:  He resides in [place] with his wife, who has a history  of dementia.  His two daughters live close by and look in frequently on his  wife.  He has four grandchildren living, one grandchild deceased secondary  to suicide.  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  He  has not smoked in 60 years.  He denies any alcohol, illegal drugs or herbal  medications.  He maintains a low salt, fat and cholesterol diet, and he  states that he walks on a regular basis without difficulty.   His mother died at the age of 9 for unknown reasons.  History of  hypertension and diabetes.  Father died at the age of 30 with old age.  He  has two brothers that are deceased, no sisters.   REVIEW OF SYSTEMS:  In addition to above, notable for reading glasses, sinus  problems, bruises easily, chronic shortness of breath which has not changed,  palpitations in the emergency room yesterday and today as previously  described.  Last episode was approximately two weeks ago.  Nocturia.  Occasional depression and anxiety, diarrhea and melena yesterday.  GERD.  Occasional history of dysphagia and odynophagia; however, this has improved  since his esophageal dilatation.   PHYSICAL EXAMINATION:  GENERAL:  A well-nourished, well-developed, pleasant  white male.  He appears very pale and his extremities are cold to the touch. VITAL SIGNS:  Temperature at this time is 98.5, blood pressure 122/68, pulse  76 and  regular, respirations 18 and regular, and 99% saturation on room air.  Orthostatics performed at the time of admission did not show a significant  drop.  HEENT:  Unremarkable except for pale conjunctivae.  NECK:  Supple without thyromegaly, adenopathy, JVD or carotid bruits.  CHEST:  Symmetrical excursion.  Lungs are clear to auscultation  although  lung sounds were slightly diminished.  CARDIAC:  PMI is not displaced.  There is a regular rate and rhythm.  I  could not appreciate any murmurs, rubs, clicks or gallops.  ABDOMEN:  Flat, bowel sounds present, without organomegaly, masses or  tenderness.  EXTREMITIES:  Negative cyanosis, clubbing or edema. His extremities are  pale.  He has peripheral 2+ pulses throughout without clubbing or edema.  NEUROLOGIC:  Alert and oriented.  Cranial nerves II-XII are grossly intact.  SKIN:  Integument appears to be intact.  He does have multiple bruises on  his upper extremities.   LABORATORY DATA:  Chest x-ray is pending at the time of this dictation.  EKG  on admission shows normal sinus rhythm with normal axes and a rate of 73,  nonspecific changes and regular intervals.  Subsequent EKG at approximately  1449 shows atrial fibrillation with a ventricular rate of 147, nonspecific  ST-T wave changes, and EKG currently shows normal sinus rhythm, as does the  telemetry monitor.  H&H is 11.1 and 32.5 on admission with normal indices,  platelets 162, WBC is 4.2.  At approximately 1:20 his H&H had dropped to 8.3  and 24.6, and earlier today his H&H was 8.0 and 23.6 post two units of  packed RBC.  Sodium was 141, potassium 4.8, BUN 53, creatinine 0.7, normal  LFTs, total bilirubin slightly elevated at 1.5.  PTT 30, PT 16.4 with an INR  of 1.4.  Urinalysis was unremarkable.   Currently he is receiving nitroglycerin 0.4 mg p.r.n., digoxin 0.25 mg  daily, Astelin nasal spray two sprays b.i.d.  He has received vitamin K 2 mg  subcu x1 and 10 mg IV x1 and  Lovenox for DVT prophylaxis.  Dr. Russella Dar has  also seen him in consultation and plans an EGD.   IMPRESSION:  1.  Probable upper gastrointestinal bleed with recent endoscopy and      esophageal dilatation on February 01, 2005.  2.  Hypotension secondary to blood loss.  It is unclear if the patient was      in atrial fibrillation at the time of hypotension.  3.  Intermittent atrial fibrillation with a rapid ventricular rate with a      new history of paroxysmal atrial fibrillation, currently in normal sinus      rhythm.  4.  Subtherapeutic INR on presentation secondary to being held from November      2-8 for recent endoscopy.  The patient resumed his Coumadin on February 03, 2005.  5.  History per past medical history.   DISPOSITION:  Recommend transfuse to keep hemoglobin greater than 10.  Rule  out myocardial infarction with hypotension and atrial fibrillation with increased ventricular rate.  We will resume sotalol at 40 mg b.i.d., however  hold it if blood pressure should drop less than 100.  We will continue to  follow during this hospitalization and make further suggestions as changes  occur.      [name] [name], P.A. [facility]     EW/MEDQ  D:  02/06/2005  T:  02/07/2005  Job:  [id number]

## 2010-08-13 NOTE — Assessment & Plan Note (Signed)
[facility]                           ELECTROPHYSIOLOGY OFFICE NOTE   NAME:[name] [name], [name] [name]                      MRN:          [id number]  DATE:02/08/2006                            DOB:          04/07/1922    [name] [name] returns today for follow-up.  He is a very pleasant elderly male  with a history of paroxysmal atrial fibrillation and sinus bradycardia who  has been on sotalol therapy.  I saw him last back in October and recommended  at that time that we continue him on sotalol, while he had had some  breakthrough of his atrial fibrillation, for the most part it had been  stable.  The plan today was to have him come back in follow-up and make  additional evaluation and treatment recommendations based on how much of the  time he had been maintained in sinus rhythm.  The patient states that he has  had two episodes of dizziness and both times he sat down as we had  recommended, for about one hour and his symptoms resolved.  Otherwise, he  has been stable.  He denies chest pain or shortness of breath.   PHYSICAL EXAMINATION:  GENERAL APPEARANCE:  He is a pleasant, well-appearing  elderly man in no distress.  VITAL SIGNS:  Blood pressure 108/52, pulse 60 and regular, respirations 18,  weight 149 pounds.  NECK:  No jugular venous distension.  LUNGS:  Clear to auscultation bilaterally.  CARDIOVASCULAR:  Regular rate and rhythm with normal S1 and S2.  EXTREMITIES:  No edema.   Interrogation of his pacemaker demonstrates a Medtronic Vitatron T60 with no  P-waves secondary to sinus node dysfunction.  The R-waves were greater than  8.  The impedance 450 in the atrium, 600 in the ventricle, threshold 0.75 at  0.4 in the atrium, 0.625 at 0.4 in the right ventricle.  Battery voltage was  2.8 volts.  The patient was in atrial fibrillation approximately 90% of the  time.   IMPRESSION:  1. Paroxysmal atrial fibrillation.  2. Sinus bradycardia.  3. Status  post pacemaker.  4. Chronic sotalol therapy.   DISCUSSION:  Overall, [name] [name] is stable and is tolerating his atrial  fibrillation very nicely.  I will plan to see him back in one year and he  will follow up with pacemaker checks over the phone.     [name]. Ladona Ridgel, MD  Electronically Signed    GWT/MedQ  DD: 02/08/2006  DT: 02/08/2006  Job #: [id number]

## 2010-08-13 NOTE — Assessment & Plan Note (Signed)
[facility]                              CARDIOLOGY OFFICE NOTE   NAME:[name] [name]                      MRN:          [id number]  DATE:10/31/2005                            DOB:          Dec 22, 1922    PRIMARY CARE PHYSICIAN:  Benedetto Goad, M.D., [facility].   MEDICAL HISTORY:  [name] [name] returns for followup visit for management of  his atrial fibrillation.  He has been intolerant to amiodarone.  Had to take  him off flecainide when he had an anterior wall infarction.  We finally put  a DDD pacemaker in to help [place] give him sotalol to control his rhythm and he  has been on 80 mg twice a day.  He was recently admitted in July with a  recurrent episode of atrial fibrillation with near syncope and he converted  to sinus rhythm on his own.   He returns today thinking that he has been back in atrial fib.  He says he  had two episodes of these.  His symptoms are a little different than what he  had before.  Instead of feeling a rapid heart rate, he feels dizziness and  feels sweaty.  He came into day and, on his initial ECG, he was in sinus  rhythm.  We interrogated his pacemaker and he had had a 14% atrial fib  burden over the past month since he was in the hospital.   PAST MEDICAL HISTORY:  Significant for an anterior wall myocardial  infarction, treated with stenting of LAD in 2005.  He also has a DDD  pacemaker which was implanted in 2006, which is a Paramedic.  He also has  diabetes and hyperlipidemia.   CURRENT MEDICATIONS:  Aspirin, Nexium, warfarin, sotalol 120 mg b.i.d.,  digoxin, Lipitor, and metformin.   EXAMINATION:  VITAL SIGNS:  Blood pressure 91/58, pulse 71 and regular.  NECK:  No venous distention.  Carotid pulses were full without bruits.  CHEST:  Clear.  CARDIAC:  Cardiac rhythm was regular.  I could hear no murmurs or gallops.  ABDOMEN:  Soft without organomegaly.  EXTREMITIES:  Peripheral pulses were full  with no peripheral edema.   LABORATORY DATA:  An EKG today showed atrial paced rhythm with a narrow QRS  and a corrected Q-T of 4.42.   IMPRESSION:  1.  Recurrent symptoms of dizziness and diaphoresis with interim 14% atrial      fib burden since hospitalization last month.  2.  Status post Vitatron DDD pacemaker implantation.  3.  Status post anterior wall myocardial infarction treated with stenting in      the LAD in 2005.  4.  Type 2 diabetes.  5.  Hyperlipidemia.  6.  Intolerance to amiodarone.   RECOMMENDATIONS:  It appears that [name] [name] has had recurrent atrial  fibrillation despite 120 of sotalol.  I think maybe the best option would be  to increase his sotalol from 120 mg 160 twice a day and recheck an ECG.  I  will consult with Dr. Ladona Ridgel on this.  I will also need to review  his chart  to see if he has been on Ticadin before.  I do not remember that we have  used that.   ADDENDUM:  I discussed [name] [name] situation with [name] [name]. Ladona Ridgel, MD,  today.  Will plan to increase [name] [name]'s sotalol dose from 120 mg b.i.d.  to 180 mg in the morning (1-1/2 tablets) and 120 mg in the evening.  We will  plan to bring him in the hospital for 48 hours to monitor his heart rhythm  and his ECG while we do this.  He is at some increased risk from arrhythmias  due to the fact that he has renal insufficiency with creatinine of 2.1 and  has coexisting coronary disease.  Tikosyn will probably not be a very good  option since he we will only be able to use very low doses with his renal  insufficiency.  He is intolerant to amiodarone and we cannot use flecainide  because of his coexisting coronary disease.  He is not sure when will be  best for him to come in, and [name] [name] will try and arrange this sometime in the  next week.  I will be gone beginning August 8 for 8 days, and [name] [name] is  aware of this.                                Bruce [name], MD, [facility]   Job# [id number]  BRB/MedQ   DD:  10/31/2005  DT:  10/31/2005  Job #:  [id number]

## 2010-08-13 NOTE — Consult Note (Signed)
NAME:  [name] [name], [name] [name]                         ACCOUNT NO.:  [id number]   MEDICAL RECORD NO.:  [id number]                   PATIENT TYPE:  INP   LOCATION:  3736                                 FACILITY:  [facility]   PHYSICIAN:  Duke Salvia, M.D.               DATE OF BIRTH:  29-Oct-1922   DATE OF CONSULTATION:  12/10/2003  DATE OF DISCHARGE:                                   CONSULTATION   HISTORY:  Thank you very much for asking me to see [name] [name] in  consultation for atrial fibrillation with a rapid ventricular response that  is paroxysmal.   Mr. 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  About three  or four years ago, he was apparently tried on amiodarone which he failed to  tolerate because of a skin rash.  He was then placed on flecainide with a  prior history of a negative catheterization and a negative Cardiolite and  did very well on this drug for the subsequent three years.  He has a  seizure on Monday which was much worse than other spells that he had.  This was accompanied by diaphoresis but no chest discomfort, and no  palpitations.  He called EMS.  Upon arrival, he was found to be in atrial  fibrillation with a relatively slow ventricular response apparently.  En  route, he developed ventricular fibrillation requiring cardioversion.  He  then went to the catheterization laboratory where he was found to have an  occluded LAD and underwent reperfusion.   His other disease was modest in his OM2 and his mid RCA.  Ejection fraction  was approximately 50% with anterior apical akinesis.   His postoperative course has been notable for recurrent episodes of atrial  fibrillation with a very-rapid ventricular response in the 160 to 180 range.  That was accompanied only by modest symptoms of flushiness  which he  describes as feeling hot.  Specifically, he denies chest discomfort,  shortness of breath or lightheadedness.  He was given multiple doses of  intravenous Lopressor this morning and subsequently converted to sinus  rhythm with a heart rate of about 55 or 60.  He then developed atrial  fibrillation again this evening with rates again in the 140 range.  Lopressor was again given intravenously. When he converted, he had a heart  rate of about 45.  He currently has a heart rate of about 60.   His thromboembolic risk factors are notable for diabetes and age, but  negative for prior neurological events or heart failure.   PAST MEDICAL HISTORY:  In addition to the above, notable for bladder cancer.   REVIEW OF SYSTEMS:  Noted on the intake sheet from Joellyn Rued dated  September 12, and  is not further recounted.   SOCIAL HISTORY:  He is married.  He lives with his wife who apparently has  Alzheimer's disease.  He has two daughters and four grandchildren.  He does  not use cigarettes, alcohol or recreational drugs.   MEDICATIONS:  1.  Protonix.  2.  Claritin.  3.  Aspirin.  4.  Plavix 75.  5.  Insulin p.r.n.  6.  Lopressor 25 q.8h.  7.  Zocor.  8.  Enalapril held today because of hypotension.   ALLERGIES:  SULFA and intolerance to AMIODARONE.   PHYSICAL EXAMINATION:  GENERAL:  He is an elderly Caucasian male, in no  acute distress.  VITAL SIGNS:  Blood pressure 135/50 with a heart rate of 62.  His  respirations are 16 and unlabored.  HEENT:  Exam demonstrated no __________ xanthomata.  NECK:  Veins were flat.  Carotids were brisk and full bilaterally without  bruits.  BACK:  Without kyphosis or scoliosis.  LUNGS:  Clear.  HEART:  Sounds were regular, S1 was diminished.  There was an early systolic  murmur heard along the right sternal border.  An S4 was evident.  ABDOMEN:  Soft with active bowel sounds, without midline pulsation or  hepatomegaly.  PULSES:  Femoral  pulses were 2+ from the left.  There was still an Angio-  Seal bandage on the right.  Distal pulses were intact.  There was no  clubbing, cyanosis, or edema.  NEUROLOGIC:  Exam was grossly normal.  SKIN: Warm and dry.   Electrocardiogram dated September 13 demonstrated sinus rhythm at 62, with  intervals of 0.14, 0.08, 0.45.  There is mild QT prolongation. This will  need to be clarified.   IMPRESSION:  1.  Atrial fibrillation, paroxysmal with a rapid ventricular response.  2.  Previous treatment with Tambocor, now precluded because of his ischemic      event.  3.  Recent acute myocardial infarction, complicated by the ?Tambocor and      ischemia.  4.  Previous intolerance to amiodarone.  5.  Thromboembolic risk factors notable for age and diabetes.  6.  Propensity to bradycardia.  7.  Mild QT prolongation.  8.  History of bladder cancer.   DISCUSSION:  [name] [name] has atrial fibrillation in the context of  thromboembolic risk factors notable for age and hypertension.  First issue  will have to be decided is whether he should be on Coumadin.  Unless there  is a contraindication, I would be in favor of that.  There have been some  problems with superficial bleeding previously.   The next issue is rhythm versus rate control.  Given what we have seen in  the hospital,  I think rate control is going to be very difficult.  He has  to have tachycardia and bradycardia.  In the absence of backup brady pacing,  I think this will be difficult.  Furthermore, there has also been some  relative hypotension which might preclude significant doses of AV nodal  blocking agents.   Given the long antecedent history of atrial fibrillation, certainly while  there may be an exacerbation in frequency now, it is improbable that he will  be atrial fibrillation-free post this event, and so control of his rapid  rate will become important.  The options that this leaves [place] with are rhythm controlling  agents.  Amiodarone is not option, thus leaving [place] with sotalol and dofetilide.  I  favor dofetilide as more effective.  However, we  will need to wait for his  QT prolongation that accompanied his infarction to resolve prior to  initiation of therapy.  This may take a day or two.   RECOMMENDATIONS:  Based on the above:  1.  Undertake Coumadin therapy.  2.  Dofetilide if his magnesium is okay and once his QT normalizes.   Thank you very much for this consultation.                                               Duke Salvia, M.D.    SCK/MEDQ  D:  12/10/2003  T:  12/10/2003  Job:  [id number]

## 2010-08-13 NOTE — H&P (Signed)
NAME:  [name] [name], [name] [name] NO.:  [id number]   MEDICAL RECORD NO.:  [id number]          PATIENT TYPE:  INP   LOCATION:  1825                         FACILITY:  [facility]   PHYSICIAN:  Charlies Constable, M.D. [facility] DATE OF BIRTH:  Jul 17, 1922   DATE OF ADMISSION:  01/02/2004  DATE OF DISCHARGE:                                HISTORY & PHYSICAL   SUMMARY OF HISTORY:  [name] [name] is a 77 year old white male who is  transported via EMS to [facility] emergency room secondary to palpitations.  Since his discharge on December 12, 2003 from [facility] he has  done well until this morning.  After he awoke, he sat up on the side of the  bed at approximately 5:45 a.m. and noted a sudden increase in heart rate.  He noted that he was back in atrial fibrillation.  He did have some  associated left-sided anterior chest constant pain associated with  diaphoresis; unassociated with radiation, nausea, vomiting, or shortness of  breath.  He gave it a 5-6 on a scale of 0-10.  He took a sublingual  nitroglycerin with relief in less than 10 minutes.  He called EMS.  Upon  their arrival, he noted that he was dizzy.  Vital signs showed a blood  pressure of 110/80, pulse 128 and irregular. Telemetry showed atrial  fibrillation.  He felt that the episode was similar to his presentation on  September 12 but not near as bad.  He has not had any further chest  discomfort since the nitroglycerin he took at home.   However, in the emergency room after speaking with Dr. Juanda Chance, we  administered 60 mg of p.o. Cardizem and started him on a Cardizem drip.  He  began complaining of back discomfort, followed by similar chest discomfort  associated with diaphoresis.  He received a low-dose morphine and sublingual  nitroglycerin which dropped his blood pressure to the 70s.  It improved his  back and chest discomfort, and his IV Cardizem was discontinued.  At the  time of Dr. Regino Schultze assessment his blood  pressure was in the 90s, heart  rate remained in the 130s, ventricular rate remained in the 130s off of IV  Cardizem.   PAST MEDICAL HISTORY:  Allergies are SULFA.   Medications include:  1.  Aspirin 81 mg daily.  2.  Glucophage 500 mg two daily.  3.  Nexium 400 mg daily.  4.  Ambien 10 mg one-half tablet q.h.s.  5.  Metoprolol 50 mg b.i.d.  6.  Plavix 75 mg daily.  7.  Lipitor 10 mg four tablets q.h.s. (increased from 10 mg to 40 mg on      December 31, 2003).  8.  Warfarin 5 mg daily except for 2.5 mg on Monday and Friday, recently      change by the anticoagulation clinic at [facility]  9.  Sublingual nitroglycerin 0.4.  10. Rhinocort two sprays each naris daily.  11. Astelin two sprays each naris b.i.d.   His past medical history is notable for non-insulin-dependent diabetes.  Hemoglobin A1c was 6.5 on December 08, 2003.  Hypertension.  Catheterization in 1986 which did not show any coronary artery disease.  Last Cardiolite August 2004 which showed an EF of 67%, no ischemia or wall  motion abnormalities.  He has a long history of PAF for which he was started  on Tambocor in 2000.  He has a history of AMIODARONE intolerance secondary  to a rash and Coumadin was initially discontinued secondary to rectal  bleeding.  However, he was hospitalized on September 12-16, 2005 with  episode of PAF followed by ventricular fibrillation arrest and acute  anterolateral myocardial infarction.  Emergent catheterization at that time  showed an EF of 50%, anterolateral apical akinesis, 40% ostial circumflex,  25% OM-1, 40% OM-2, 50% RCA, 25% PDA.  He underwent Cypher stenting to the  LAD, reducing this from 100% to 10%.   SOCIAL HISTORY:  He resides in [place] with his wife, who has  Alzheimer's.  He has two daughters and four grandchildren.  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  He  denies any tobacco, alcohol, or drug usage.  He denies any  herbal  medications.  He follows an ADA diet.   FAMILY HISTORY:  Both parents are deceased.  His brother is deceased with a  myocardial infarction.   REVIEW OF SYSTEMS:  Notable for chronic sinus problems, reading glasses,  bruises easily, in addition to the above-mentioned findings.   PHYSICAL EXAMINATION:  GENERAL:  Well-nourished, well-developed, pleasant  white male in no apparent distress.  VITAL SIGNS:  Temperature 93, blood pressure initially 96/69 and 103/67, in  addition to the previously-mentioned findings in the HPI.  Pulse varying  between the 130s and 140s.  Respirations 24, 100% saturations on 2 L.  HEENT:  Unremarkable.  NECK:  Supple without thyromegaly, adenopathy, JVD, or carotid bruits.  CHEST:  Symmetrical excursion.  LUNGS:  Clear to auscultation.  HEART:  Irregular irregular rapid rhythm without murmurs, rubs, clicks, or  gallops.  All peripheral pulses are symmetrical and intact without abdominal  or femoral bruits.  SKIN INTEGUMENT:  Intact.  He has multiple bruises on his upper extremities.  ABDOMEN:  Soft, slightly rounded, bowel sounds present, without  organomegaly, masses, or tenderness.  EXTREMITIES:  Negative cyanosis, clubbing, or edema.  NEUROLOGIC:  Alert and oriented x3.  Cranial nerves II-XII are grossly  intact.   Chest x-ray shows no active disease.  EKG shows atrial fibrillation with  normal axis, ventricular rate of 150.  He has anterolateral T wave changes  which remains unchanged from an EKG in the office on December 31, 2003, but is  a change from an EKG on December 08, 2003.  QTC corrected is 494.  H&H 13.6  and 39.6, normal indices, platelets 159, WBC 6.1.  Sodium 140, potassium  4.2, BUN 23, creatinine 1.1, glucose 153.  PTT 41, PT 24.5, INR 3.0.   IMPRESSION:  1.  Paroxysmal atrial fibrillation with increased ventricular response.  INR      is therapeutic at 3.0.  2.  Unstable angina relieved with sublingual nitroglycerin. 3.   Hypotension related to medications.  4.  History as previously.   DISPOSITION:  Will admit [name] [name] to CCU, rule out myocardial infarction.  We will not start heparin with a therapeutic INR.  We have called the office  for his Coumadin flow sheet.  We will try to use p.o. and IV Cardizem for  rate control as well as watching  his blood pressure.  Hopefully, with rate  control he will convert to a normal sinus rhythm.  Dr. Juanda Chance spoke with,  reviewed the patient's history, and examined the patient and agrees with  above.  Dr. Juanda Chance is well known to the patient.  He feels that with his low  blood pressure with the previously-mentioned measures, we will add digoxin  and p.o. Cardizem to Lopressor if his blood pressure tolerates.  He will  need to target heart rhythm control eventually with Tikosyn or sotalol when  QTC is less than 440.  If the patient converts and his QTC is less than 440,  we will plan to keep him in the hospital to initiate sotalol.  It is noted  that in approximately 4-6 weeks he will need repeat lipids and LFTs since  his Lipitor was recently increased.       EW/MEDQ  D:  01/02/2004  T:  01/02/2004  Job:  [id number]   cc:   Lonzo Cloud. Kriste Basque, M.D. [facility]

## 2010-08-13 NOTE — Cardiovascular Report (Signed)
NAME:  [name] [name], [name] [name]                         ACCOUNT NO.:  [id number]   MEDICAL RECORD NO.:  [id number]                   PATIENT TYPE:  INP   LOCATION:  1824                                 FACILITY:  [facility]   PHYSICIAN:  Charlies Constable, M.D. [facility]              DATE OF BIRTH:  1922-05-09   DATE OF PROCEDURE:  12/08/2003  DATE OF DISCHARGE:                              CARDIAC CATHETERIZATION   PAST MEDICAL HISTORY:  [name] [name] is 77 years old and has had paroxysmal  atrial fibrillation that has been controlled on Flecainide.  He also has  positive risk factors for coronary heart disease including a positive family  history and non-insulin-dependent diabetes.  He works at [employer],  and at work at [employer], he developed the onset of chest pain and came to [facility] at 8:00 where his EKG showed an acute anterior wall infarction.  He  was taken promptly to the cath lab after being given heparin and aspirin.  He had developed ventricular fibrillation in the field after EMS arrived and  was promptly resuscitated from EMS.  The diagnostic study was performed by  Dr. Antoine Poche and demonstrated a totally occluded LAD and 50% narrowing in  the mid right coronary artery.  The anterior lateral wall and apex were  akinetic, and the ejection fraction was about 35-40%.   PROCEDURES:  The procedure was performed by the right femoral artery and  arterial sheath and a 6-French Q4 guiding catheter with side holes.  The  patient had been given heparin in the emergency room, and his ACT was  greater than 200.  We attempted to give him Plavix, but he was only able to  take one pill due to difficulty swallowing.  He was given a ReoPro bolus and  infusion.   We were able to navigate down the LAD with an __________ soft wire without  too much difficulty.  We predilated with a 2.5x20 mm Maverick performing two  inflations up to 7 atmospheres for 30 seconds.  We then deployed a 3.0x33 mm  Cypher stent  deploying this with one inflation of 11 atmospheres for 30  seconds.  We then pulse dilated with a 3.25x20 mm Quantum  Maverick  performing two inflations of 8 atmospheres for 30 seconds, avoiding both the  distal and proximal edges.  Repeat diagnostic studies were then performed  from the guiding catheter.   The right femoral artery was closed with AngioSeal at the end of the  procedure.  The patient tolerated the procedure well and left laboratory in  satisfactory condition.   RESULTS:  Initially, the LAD was occluded before the first large septal  perforator.  Following stenting, the stenosis improved from 100% to less  than 10%, and the flow improved from TIMI 0 to TIMI 3 flow with a good  myocardial blush.  Septal perforator which was visualized after reperfusion  with the  wire had decreased flow post intervention.   The patient had the onset of chest pain at 0640 and arrived in the emergency  room at 0800.  The first balloon inflation established reperfusion at 0910.  This gave __________ balloon time of one hour and 10 minutes or 70 minutes,  and reperfusion time of two hours and 30 minutes.   CONCLUSION:  1.  Acute anterior wall myocardial infarction with total occlusion of the      proximal left anterior descending.  No major obstruction of the      circumflex artery, 50% stenosis in the mid right coronary artery and      anterolateral wall and apical akinesis as documented by Dr. Antoine Poche.  2.  Successful reperfusion and stent deployment in the proximal left      anterior descending artery with improvement in narrowing from 100% to      less than 10% improvement in the flow from TIMI 0 to TIMI 3 flow.   DISPOSITION:  The patient presents for further observation.  Will talk at  discharge on day #2 if the patient remains stable.                                               Charlies Constable, M.D. [facility]    BB/MEDQ  D:  12/08/2003  T:  12/08/2003  Job:  [id number]   cc:   Lonzo Cloud.  Kriste Basque, M.D. [facility]

## 2010-08-13 NOTE — H&P (Signed)
NAME:  [name], [name]                         ACCOUNT NO.:  [id number]   MEDICAL RECORD NO.:  [id number]                   PATIENT TYPE:  INP   LOCATION:  1824                                 FACILITY:  [facility]   PHYSICIAN:  [name] [name], M.D. [facility]              DATE OF BIRTH:  [birth year]   DATE OF ADMISSION:  12/08/2003  DATE OF DISCHARGE:                                HISTORY & PHYSICAL   HISTORY:  [name] [name] is a 77 year old white male, who was transported via  EMS to [facility] emergency room secondary to palpitations and chest  discomfort.  The patient stated that he had been doing well until  approximately 6:40 this a.m. while riding a golf cart, his part-time  employment.  He developed increased heart rate associated with diaphoresis.  He stated that he had gone back into atrial fibrillation and this was the  first episode that he has had in two years.  He found a co-worker who drove  him to the nearest telephone and he called the EMS.  At this time of EMS  arrival, the patient developed anterior chest pressure associated with  shortness of breath and diaphoresis.  Apparently during transport, the  patient went into ventricular fibrillation requiring shock, restoring normal  sinus rhythm, however the EMS report is not available at the time of this  dictation.  The patient does state in an emergency room approximately three  weeks ago, he did have an episode while doing yard work of shortness of  breath and diaphoresis.   PAST MEDICAL HISTORY:  At this time, allergies and current medications are  unknown as the patient is in the catheterization laboratory.  His past  medical history is notable for non-insulin-dependent diabetes, atypical  chest discomfort back in 1986, where catheterization did not show any  coronary artery disease.  His last Adenosine Cardiolite was in August 2004  with EF 67% without wall motion abnormalities and no ischemia.  He has had  echocardiograms in the  past in the office and these are pending.  His  medical history is also notable for paroxysmal atrial fibrillation, for  which Tambocor was started in 2000.  The Coumadin was discontinued secondary  to rectal bleeding and amiodarone intolerance was noted secondary to a rash.   ALLERGIES:  Include sulfa.   MEDICATIONS PRIOR TO ADMISSION:  1.  Aspirin 81 mg q.d.  2.  Flecainide 100 mg two tablets q.d.  3.  Metformin 500 mg two tablets q.d.  4.  Nexium 400 mg q.d.  5.  Allegra 180 mg q.d.  6.  Ambien 10 mg half tablet q. h.s.  7.  Astelin two sprays each nostril b.i.d.  8.  Rhinocort two sprays each nostril.   SOCIAL HISTORY:  He resides in [place] with his wife, who is a retired  Astronomer. and has early dementia secondary to Alzheimer's.  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  His two  daughters, [name] [name] and [name] [name], are alive and well with four grandchildren.  He has never smoked and denies alcohol, drug use or herbal medication.  He  maintains an ADA diet.   FAMILY HISTORY:  Both parents are deceased and he has a brother deceased.  Specifics are unknown at this time.   REVIEW OF SYSTEMS:  Was not able to be done secondary to the urgency of  catheterization.   PHYSICAL EXAMINATION:  Blood pressure is 106/86, respirations are 18, heart  rate 64.  [name] [name] performed the physical examination. According to his  report, well-nourished and well-developed, pleasant white male in mild  distress.  HEENT is normocephalic and atraumatic.  PERRLA.  EOMs intact.  Neck supple without thyromegaly, adenopathy, JVD or carotid bruits.  Heart  is regular rate and rhythm, normal S1 and S2.  He does have an S4 without  murmurs, rubs, clicks.  Lungs are clear to auscultation.  Abdomen: Bowel  sounds present without tenderness or organomegaly. Extremities:  Negative  cyanosis, clubbing or edema.  Peripheral pulses are intact.  It is noticed  that he has some toe  amputations.   EKG in the emergency room showed normal sinus rhythm with a ventricular rate  of 60, normal intervals.  He had ST segment elevation V1 through V4, ST  segment elevation in I and EVL, ST segment depression in II, III and AVF.  No old EKGs are available for comparison.  I-STAT showed a sodium of 139,  potassium of 3.7, BUN 23, creatinine 1.0.   IMPRESSION:  Acute anterolateral myocardial infarction, associated with  ventricular fibrillation requiring EMS defibrillation.  Probable paroxysmal  atrial fibrillation.  History as previously described.   DISPOSITION:  [name] [name] emergently saw the patient, reviewed the  patient's history and examined the patient briefly and brought the patient  to the catheterization laboratory for emergent angiogram and possible  stenting.  He will be admitted to the CCU with further treatment plans  pending diagnostic catheterization and treatment.  We will hold the  Flecainide and Glucophage, obtain office records for review.      [name] [name], P.A. [facility]                    [name] [name], M.D. [facility]    EW/MEDQ  D:  12/08/2003  T:  12/08/2003  Job:  [id number]

## 2010-08-13 NOTE — Consult Note (Signed)
NAME:  [name] [name], [name] [name] NO.:  [id number]   MEDICAL RECORD NO.:  [id number]          PATIENT TYPE:  INP   LOCATION:  3741                         FACILITY:  [facility]   PHYSICIAN:  Whiting [name], M.D. [facility] OF BIRTH:  19-Apr-1922   DATE OF CONSULTATION:  11/02/2005  DATE OF DISCHARGE:                                   CONSULTATION   REFERRING PHYSICIAN:  Dr. Charlies Constable, who is also the patient's primary  cardiologist.   HISTORY OF PRESENT ILLNESS:  An 77 year old gentleman admitted electively  for adjustment of antiarrhythmic therapy.  [name] [name] has a long history of  paroxysmal atrial fibrillation with significant symptoms that have included  dizziness, weakness, dyspnea and malaise.  He was initially treated with  flecainide, but that was discontinued when he developed symptomatic coronary  disease.  He has reportedly undergone a trial of amiodarone therapy, but the  details of that course of  treatment are not available.  The patient was  said to be intolerant of that medication.  There is reported history of  renal insufficiency causing concern regarding dofetitlide,  but recent  creatinine values have been normal.  He is admitted for uptitration of  sotalol.  He has suffered symptomatic recurrences on a monthly basis of  late.   The patient has history of sick sinus syndrome and has previously undergone  dual-chamber pacemaker insertion.  He is chronically anticoagulated.  He  underwent PCI of the left anterior descending coronary artery for acute MI  in 2005.  Ejection fraction remains near normal.  There is also history of  GERD, diabetes, hypertension and dyslipidemia.  He has suffered a GI bleed  in the past.  He has recently undergone resection of a bladder tumor.   PHYSICAL EXAMINATION:  GENERAL:  This is a trim gentleman who is quite vague  about the details of his medical history.  VITAL SIGNS: Temperature 97.6, heart rate 75 and regular, blood  pressure  115/70, respirations 22.  H  EENT:  Anicteric sclerae.  NECK:  No jugular venous distention; normal carotid upstrokes without  bruits.  LUNGS:  Clear.  CARDIAC:  Markedly increased, especially in the first heart sound; prominent  fourth heart sound; normal second heart sound.  ABDOMEN:  Soft  and nontender; no organomegaly.  EXTREMITIES:  No edema; distal pulses intact.  SKIN:  No significant lesions.   IMPRESSION:  [name] [name] has unacceptable symptoms related to paroxysmal  atrial fibrillation.  His dose of sotalol will be increased, as suggested by  Dr. Ladona Ridgel, to 180 mg q.a.m. and 120 mg q.p.m. We will follow serial EKGs  and telemetry.  If the medication is well tolerated, a hospitalization of 2-  3 days is anticipated.      [name] [name], M.D. [facility]  Electronically Signed    RR/MEDQ  D:  11/02/2005  T:  11/02/2005  Job:  [id number]

## 2010-08-13 NOTE — Discharge Summary (Signed)
[name] [name], [name] [name] NO.:  [id number]   MEDICAL RECORD NO.:  [id number]          PATIENT TYPE:  INP   LOCATION:  2034                         FACILITY:  [facility]   PHYSICIAN:  [name]. Ladona Ridgel, M.D.  DATE OF BIRTH:  [birth year]   DATE OF ADMISSION:  09/29/2005  DATE OF DISCHARGE:  10/03/2005                           DISCHARGE SUMMARY - REFERRING   DISCHARGE DIAGNOSES:  1.  Paroxysmal atrial fibrillation with a rapid ventricular rate associated      with near syncope.  2.  Supratherapeutic INR.  3.  History as noted below.   BRIEF HISTORY:  [name] [name] is an 77 year old white male who 3 days prior to  admission began feeling short of breath and described occasional dizziness,  weakness and generally feeling bad.  It was worse with exertion.  On the way  to visit his wife who is currently in a nursing home with Alzheimer's  dementia, he began to have palpations and stated that his pulse was 120.  After resting, his pulse decreased to 90, but he continued to be fast  associated with shortness of breath.  On presentation to [facility], an EKG showed atrial fibrillation and EMS transported for further  evaluation.   His medical history is notable for PAF treated in the past with Tikosyn and  Tambocor and amiodarone.  However, the patient did not tolerate.  He  underwent a TEE cardioversion in May 2007 converting him to normal sinus  rhythm.  He has been maintained on sotalol 120 b.i.d. since that time.  He  also has a history of anticoagulation with Coumadin managed by Dr. Andrey Campanile,  hyperlipidemia, GERD, status post [employer] pacer in November 2006 secondary  to brady tachy, type 2 diabetes, hypertension, GI bleed November 2006,  bladder tumor status post resection.   LABORATORY DATA:  Admission digoxin level was 0.7.  TSH 0.990.  Fasting  lipids showed a total cholesterol 114, triglycerides 71, HDL 57, LDL 43.  CK-  MBs and troponins were negative  for myocardial infarction.  Admission sodium  was 138, potassium 4.6, BUN 32, creatinine 1.0, normal LFTs except for total  bilirubin that was slightly elevated at 1.4, glucose 115.  Admission PT was  39.4 with INR of 4.1 and a PTT of 50.  H&H was 12.7 and 38.9, normal  indices, platelets 194, WBCs 9.1.  On the 8th, INR was 2.3.  Chest x-ray  showed dual-lead permanent cardiac pacer, no active disease.  Admission EKG  showed atrial fibrillation with a ventricular rate of 110.  Prior to  discharge, the patient was in a sinus rhythm.   HOSPITAL COURSE:  [name] [name] was admitted to the unit 2000 by Dorian Pod, N.P., and Dr. Myrtis Ser.  Pharmacy assisted with Coumadin with his level  at 4.1.  His warfarin was held.  Pacemaker was checked on September 29, 2005 by  Jenness Corner with [employer].  Mode switching was changed from 6 to auto.  Echocardiogram was performed, and Cardizem 30 mg q.6h. had been added to  control his ventricular rate.  His Cardizem was  increased with plans for a  TEE cardioversion if patient did not revert to normal sinus rhythm.  Sometime before the 9th, the patient converted to sinus rhythm.  Dr. Ladona Ridgel  saw the patient on the morning of the 9th and felt that he could possibly be  discharged home after ambulation, and the patient was not having any further  symptoms of dizziness or generalized weakness.  He recommended continued  sotalol.  The patient ambulated the hallway twice with nursing staff.  He  did experience some lightheadedness with getting out of bed, but the patient  stated this was chronic in nature.  Blood pressure remained stable post  ambulation.  Thus, it was felt he could be discharged home.   DISPOSITION:  The patient is discharged home.  He was asked to maintain low  salt, fat cholesterol diet.  Activities were advised to increase activity  slowly.  He is to bring all medications to all appointments.   MEDICATIONS:  1.  Sotalol 120 mg b.i.d.  2.   Digoxin 0.125 mg q.d.  3.  Lipitor 20 mg q.h.s.  4.  Nexium 40 mg q.d.  5.  Warfarin 2.5 mg q.d. except 5 mg Monday, Wednesday and Friday (decreased      dose).  6.  Aspirin 81 mg p.o. q.d.   He will follow up with Dr. Juanda Chance on August 6 at 12:00 p.m. He was asked to  arrange a follow-up PT/INR with Dr. Andrey Campanile this Thursday or Friday, since  adjustments on his Coumadin had been made given his supratherapeutic level  on admission and to follow up with Dr. Andrey Campanile as needed.  Just prior to  discharge, pharmacy recommended that Glucophage be discontinued given his  age.  Nursing also made [place] aware that the patient's daughter had called  earlier today and stated that his wife was in somewhat poor health.   Discharge time less than 30 minutes.      Joellyn Rued, P.A. [facility]    ______________________________  [name]. Ladona Ridgel, M.D.    EW/MEDQ  D:  10/03/2005  T:  10/03/2005  Job:  [id number]   cc:   Gloriajean Dell. Andrey Campanile, M.D.  Fax: [phone]   Charlies Constable, M.D. [facility]  [address]  [address]  [place]  [place] [address]

## 2010-08-13 NOTE — Cardiovascular Report (Signed)
NAME:  [name] [name], [name] [name]                         ACCOUNT NO.:  [id number]   MEDICAL RECORD NO.:  [id number]                   PATIENT TYPE:  INP   LOCATION:  1824                                 FACILITY:  [facility]   PHYSICIAN:  Rollene Rotunda, M.D.                DATE OF BIRTH:  [birth year]   DATE OF PROCEDURE:  12/08/2003  DATE OF DISCHARGE:                              CARDIAC CATHETERIZATION   PRIMARY CARE PHYSICIAN:  Lonzo Cloud. Kriste Basque, M.D.   CARDIOLOGIST:  Charlies Constable, M.D.   PROCEDURE:  Left heart catheterization/coronary arteriography.   INDICATIONS FOR PROCEDURE:  Evaluate patient with acute anterior myocardial  infarction.   DESCRIPTION OF PROCEDURE:  Left heart catheterization performed with the  right femoral artery.  The artery was cannulated using anterior wall  puncture.  A #6 French arterial sheath was inserted via the modified  Seldinger technique.  Pre-formed Judkins and pigtail catheter were utilized.  The initial sheath was exchanged for a 7 French sheath as there was some  oozing under the skin and a slight hematoma.   RESULTS:  Hemodynamics:  LV 111/22, AL 111/77.  Coronaries and left main was  normal.  The left anterior descending has ostial 30% stenosis, proximal  occlusion.  The circumflex had distal 40% stenosis before posterior lateral.  Ramus intermediate was large and normal.  The obtuse marginal #1 was  moderate with ostial 25% stenosis.  An obtuse marginal #2 had ostial 40%  stenosis.  The right coronary artery had a mid 50% stenosis with slight  haziness.  There was a posterior descending artery which was large with a  tandem 25% lesion.   LEFT VENTRICULOGRAM:  The left ventriculogram was obtained in the RAO  projection.  The ejection fraction was 50% with moderate anterior and  anterior apical akinesis.   CONCLUSIONS:  Severe single vessel coronary artery disease.  Moderate left  ventricular dysfunction.   PLAN:  The patient will undergo urgent  percutaneous intervention of the left  anterior descending.                                               Rollene Rotunda, M.D.    JH/MEDQ  D:  12/08/2003  T:  12/08/2003  Job:  [id number]   cc:   Lonzo Cloud. Kriste Basque, M.D. [facility]

## 2010-08-13 NOTE — Assessment & Plan Note (Signed)
[facility]                              CARDIOLOGY OFFICE NOTE   NAME:[name] [name], [name] [name]                      MRN:          [id number]  DATE:12/14/2005                            DOB:          Jan 16, 1923    PRIMARY CARE PHYSICIAN:  Gloriajean Dell. Andrey Campanile, M.D., [facility].   CLINICAL HISTORY:  [name] [name] returns for a follow-up visit for management  of his atrial fibrillation.  He has had recurrent atrial fibrillation and  has been intolerant of amiodarone due to a rash and unable to take  flecainide due to the development of coronary disease.  He has been managed  with sotalol but developed bradycardia and required a pacemaker, and we have  increased his dose of sotalol.  He was controlled for some time but recently  has felt recurrence of atrial fibrillation, despite increasing his dosage to  120 in the morning and 180 in the afternoon.  When I saw him last week, he  was somewhat dizzy, and his blood pressure was borderline in Dr. Lucie Leather  office.  He had converted to a sinus rhythm by the time I had seen him.  Since he was on maximal recommended sotalol dose, I recommended increasing  his digoxin from a half of a 0.25 mg tablet to a whole tablet.  His dig  level subsequently returned low at 0.4.  He says he has done quite well  since then and has had no recurrence of his atrial fibrillation.  His main  symptom with fibrillation is the dizziness.   His past medical history is significant for coronary artery disease, and he  had an anterior wall myocardial infarction treated with stenting several  years ago.  He also has diabetes and hyperlipidemia.  He also has had renal  insufficiency in the past with creatinines of 2.1, but his last one was 1.1.   His current medications include aspirin, Nexium, Coumadin, digoxin 0.25 mg  daily, Lipitor, Metformin, and sotalol 120 mg in the morning, and 180 mg in  the afternoon.   PHYSICAL  EXAMINATION:  VITAL SIGNS:  Blood pressure 120/74, pulse 60 and  regular.  NECK:  There was no venous distention.  Carotid pulses were full without  bruits.  CHEST:  Clear.  HEART:  Cardiac rhythm was regular.  No murmurs or gallops.  ABDOMEN:  Soft with normal bowel sounds.  There is no hepatosplenomegaly.  EXTREMITIES:  Peripheral pulses were full with no peripheral edema.   The ECG showed a paced rhythm and was otherwise normal.  There was no  residual evidence of his old anterior wall infarction.   IMPRESSION:  1. Recurrent atrial fibrillation, now improved with increasing digoxin      therapy.  2. Status post DDD pacemaker implantation.  3. Intolerance to AMIODARONE and inability to take FLECAINIDE due to      coronary heart disease.  4. Diabetes.  5. Hyperlipidemia.  6. Renal insufficiency, now improved.   RECOMMENDATIONS:  I think [name] [name] is doing better.  He is really on  maximum recommended dose of sotalol.  We got a digoxin level today to see if  he is therapeutic.  I think his chances of recurrence are moderately high,  and he is scheduled to see Dr. Ladona Ridgel in the next two weeks.  Considerations  would be AV nodal ablation or possible atrial fib ablation.  He does have  good LV function, despite his previous anterior wall infarction, but AV  nodal ablation would have a disadvantage of causing a paced rhythm in  somebody with coronary artery disease the potential for worse LV dysfunction  in the future.  I will plan to see him back in followup in three months.                               Bruce [name], MD, [facility]    BRB/MedQ  DD:  12/14/2005  DT:  12/15/2005  Job #:  [id number]   cc:   [name]. Ladona Ridgel, MD

## 2010-08-13 NOTE — Discharge Summary (Signed)
NAMEPASQUALE, MATTERS NO.:  [id number]   MEDICAL RECORD NO.:  [id number]          PATIENT TYPE:  INP   LOCATION:  3741                         FACILITY:  [facility]   PHYSICIAN:  Vida Roller, M.D.   DATE OF BIRTH:  Aug 17, 1922   DATE OF ADMISSION:  11/02/2005  DATE OF DISCHARGE:  11/05/2005                           DISCHARGE SUMMARY - REFERRING   DISCHARGE DIAGNOSIS:  Atrial fibrillation with rapid ventricular rate  admitted for observation with sotalol increased.  History is noted below.   SUMMARY OF HISTORY:  Mr. Lysaght is an 77 year old male who was electively  admitted for adjustment of antiarrhythmic therapy.  Mr. Dubberly has a long  history of paroxysmal atrial fibrillation with associated symptoms of  dizziness, weakness, dyspnea and malaise initially treated with flecainide  but this was discontinued when he developed symptomatic coronary disease.  At some point in the past amiodarone therapy has also been tried but  specifics are not available and he is said to be intolerant.  There is also  reported history of renal insufficiency but recent creatinines have been  within normal limits.  He was admitted for following telemetry so that we  could increase his sotalol.   PAST MEDICAL HISTORY:  Past medical history is notable for sick sinus  syndrome with dual-chamber pacer insertion, chronically anticoagulated,  myocardial infarction in 2005, normal LV function, GERD, diabetes,  hypertension, dyslipidemia and GI bleed in the past as well as bladder  tumor.   LABORATORY DATA:  Admission weight was 140 pounds.  H&H were 11.3 and 34.4,  normal indices, platelets 154, WBCs 5.3.  PT on admission was 25.6 and INR  2.2.  At the time of discharge PT was 22.3 with an INR of 1.9.  Sodium was  138, potassium 4.9, BUN 27, creatinine 1.1, glucose 112.  EKGs showed sinus  rhythm, nonspecific ST-T wave changes, early R, QTC 450, at the time of  discharge QTC remained 452,  no change from EKG on November 04, 2005.   HOSPITAL COURSE:  Mr. Terral was admitted to 3700.  Loura Pardon saw for EP  with Dr. Dietrich Pates.  His sotalol was increased from 120 mg b.i.d. to 120 in  the morning and 180 in the evening.  Over the next several days telemetry  continued to show sinus rhythm with a pacing occasionally.  He did not have  any tachypalpitations and by Saturday his QTC had remained in normal limits  thus it was felt he could be discharged home.   DISPOSITION:  The patient is discharged home, asked to maintain a low  salt/fat/cholesterol/ADA diet.  Activities were not restricted.  He was  asked to bring all his medications to all appointments.  His medications  include an increase of sotalol to 120 mg in morning and 180 in the evening,  his Coumadin will remain the same at 5 mg half a tablet daily except a whole  tablet on Monday, Wednesday and Friday.  He was asked to continue his other  medications which include digoxin 0.125 mg daily, Lipitor 20 mg q.h.s.,  Nexium 40 mg  daily, aspirin 81 daily,  metformin 500 mg daily and tramadol 50 mg p.r.n.  He was asked to call our  office on Monday to arrange a 2- to 4-week appointment with Dr. Juanda Chance.  He  will follow up with [facility] practice for a PT/INR on Monday to  maintain a target INR between 2 and 3. Discharge time less than 30 minutes.      Joellyn Rued, P.A. [facility]      Vida Roller, M.D.  Electronically Signed    EW/MEDQ  D:  11/05/2005  T:  11/05/2005  Job:  [id number]   cc:   Gloriajean Dell. Andrey Campanile, M.D.  Charlies Constable, M.D. [facility]

## 2010-08-16 ENCOUNTER — Encounter: Payer: Self-pay | Admitting: *Deleted

## 2010-08-16 NOTE — Telephone Encounter (Signed)
Okay with Dr Allred 

## 2011-05-04 ENCOUNTER — Encounter (HOSPITAL_COMMUNITY): Payer: Self-pay | Admitting: *Deleted

## 2011-05-04 ENCOUNTER — Emergency Department (HOSPITAL_COMMUNITY)
Admission: EM | Admit: 2011-05-04 | Discharge: 2011-05-04 | Disposition: A | Discharge status: Skilled Nursing Facility | Payer: MEDICARE | Attending: Family Medicine | Admitting: Family Medicine

## 2011-05-04 DIAGNOSIS — I2510 Atherosclerotic heart disease of native coronary artery without angina pectoris: Secondary | ICD-10-CM | POA: Insufficient documentation

## 2011-05-04 DIAGNOSIS — I4891 Unspecified atrial fibrillation: Secondary | ICD-10-CM | POA: Insufficient documentation

## 2011-05-04 DIAGNOSIS — Z950 Presence of cardiac pacemaker: Secondary | ICD-10-CM | POA: Insufficient documentation

## 2011-05-04 DIAGNOSIS — N189 Chronic kidney disease, unspecified: Secondary | ICD-10-CM | POA: Insufficient documentation

## 2011-05-04 DIAGNOSIS — R112 Nausea with vomiting, unspecified: Secondary | ICD-10-CM | POA: Insufficient documentation

## 2011-05-04 DIAGNOSIS — F0390 Unspecified dementia without behavioral disturbance: Secondary | ICD-10-CM | POA: Insufficient documentation

## 2011-05-04 DIAGNOSIS — H9190 Unspecified hearing loss, unspecified ear: Secondary | ICD-10-CM | POA: Insufficient documentation

## 2011-05-04 DIAGNOSIS — F3289 Other specified depressive episodes: Secondary | ICD-10-CM | POA: Insufficient documentation

## 2011-05-04 DIAGNOSIS — R64 Cachexia: Secondary | ICD-10-CM | POA: Insufficient documentation

## 2011-05-04 DIAGNOSIS — N390 Urinary tract infection, site not specified: Secondary | ICD-10-CM

## 2011-05-04 DIAGNOSIS — F329 Major depressive disorder, single episode, unspecified: Secondary | ICD-10-CM | POA: Insufficient documentation

## 2011-05-04 DIAGNOSIS — E785 Hyperlipidemia, unspecified: Secondary | ICD-10-CM | POA: Insufficient documentation

## 2011-05-04 DIAGNOSIS — R197 Diarrhea, unspecified: Secondary | ICD-10-CM | POA: Insufficient documentation

## 2011-05-04 DIAGNOSIS — E119 Type 2 diabetes mellitus without complications: Secondary | ICD-10-CM | POA: Insufficient documentation

## 2011-05-04 DIAGNOSIS — Z79899 Other long term (current) drug therapy: Secondary | ICD-10-CM | POA: Insufficient documentation

## 2011-05-04 DIAGNOSIS — E782 Mixed hyperlipidemia: Secondary | ICD-10-CM | POA: Insufficient documentation

## 2011-05-04 HISTORY — DX: Major depressive disorder, single episode, unspecified: F32.9

## 2011-05-04 HISTORY — DX: Depression, unspecified: F32.A

## 2011-05-04 LAB — URINE MICROSCOPIC-ADD ON

## 2011-05-04 LAB — CBC
MCHC: 31.4 g/dL (ref 30.0–36.0)
Platelets: 154 10*3/uL (ref 150–400)
RDW: 15.8 % — ABNORMAL HIGH (ref 11.5–15.5)

## 2011-05-04 LAB — URINALYSIS, ROUTINE W REFLEX MICROSCOPIC
Bilirubin Urine: NEGATIVE
Glucose, UA: NEGATIVE mg/dL
Ketones, ur: NEGATIVE mg/dL
Protein, ur: 30 mg/dL — AB

## 2011-05-04 LAB — BASIC METABOLIC PANEL
BUN: 25 mg/dL — ABNORMAL HIGH (ref 6–23)
Calcium: 7.9 mg/dL — ABNORMAL LOW (ref 8.4–10.5)
GFR calc Af Amer: 87 mL/min — ABNORMAL LOW (ref >90)
GFR calc non Af Amer: 75 mL/min — ABNORMAL LOW (ref >90)
Glucose, Bld: 107 mg/dL — ABNORMAL HIGH (ref 70–99)
Sodium: 139 mEq/L (ref 135–145)

## 2011-05-04 LAB — CARDIAC PANEL(CRET KIN+CKTOT+MB+TROPI)
CK, MB: 2.1 ng/mL (ref 0.3–4.0)
Total CK: 25 U/L (ref 7–232)

## 2011-05-04 LAB — URINALYSIS, DIPSTICK ONLY
Ketones, ur: NEGATIVE mg/dL
Nitrite: POSITIVE — AB
Protein, ur: 30 mg/dL — AB
pH: 6 (ref 5.0–8.0)

## 2011-05-04 LAB — GLUCOSE, CAPILLARY: Glucose-Capillary: 77 mg/dL (ref 70–99)

## 2011-05-04 MED ORDER — CIPROFLOXACIN HCL 500 MG PO TABS: 500.0000 mg | ORAL_TABLET | Freq: Two times a day (BID) | ORAL | Status: AC

## 2011-05-04 MED ORDER — ONDANSETRON HCL 4 MG/2ML IJ SOLN
INTRAMUSCULAR | Status: AC
  Filled 2011-05-04: qty 2

## 2011-05-04 MED ORDER — SODIUM CHLORIDE 0.9 % IV SOLN: Freq: Once | INTRAVENOUS | Status: DC

## 2011-05-04 MED ORDER — CIPROFLOXACIN HCL 500 MG PO TABS
500.0000 mg | ORAL_TABLET | Freq: Once | ORAL | Status: AC
  Administered 2011-05-04: 500 mg via ORAL
  Filled 2011-05-04: qty 1

## 2011-05-04 NOTE — ED Provider Notes (Signed)
History     CSN: [id number]  Arrival date & time 05/04/11  1105   First MD Initiated Contact with Patient 05/04/11 1114      No chief complaint on file.   HPI Patient states that he is not sure why he is here.  He does not remember feeling badly, calling EMS, or riding in the ambulance. The patient states he lives a lone and takes care of himself.   Patient's daughter arrives later, and feels her father is at baseline.  He lives at an [facility] home, who called EMS due to patient having vomiting.   Past Medical History  Diagnosis Date  . Cardiac pacemaker in situ     s/p Viatron DDD Pacemaker for sinus node dysfunction  . Orthostatic hypotension   . Hyperlipidemia     mixed  . Atrial fibrillation     not felt to be a coumadin candidate due to falls  . Sinoatrial node dysfunction     s/p PPM (MDT)  . Coronary atherosclerosis of native coronary artery     s/p AWMI 2005 stent LAD  . Diabetes mellitus, type 2   . Chronic renal insufficiency, stage II (mild)   . Intolerance, drug     amniodarone  . Upper GI hemorrhage     2006 Mallory Weiss Tear  . MI (myocardial infarction)     coronary artery status post anterior wall myocardial infarction in 2005 treated with a stent to the left anterior descending.  . Dizziness     recurrent dizziness and falls  . Diabetes mellitus   . Hyperlipidemia   . Renal insufficiency     mild  . Dementia   . Depression     Past Surgical History  Procedure Date  . Pacemaker placement   . Coronary stent placement     Family History  Problem Relation Age of Onset  . Coronary artery disease Brother   . Diabetes Mother     History  Substance Use Topics  . Smoking status: Former Smoker    Quit date: 03/28/1960  . Smokeless tobacco: Not on file  . Alcohol Use: No      Review of Systems  Constitutional: Negative for fever.  HENT: Positive for hearing loss.   Eyes: Negative for visual disturbance.  Respiratory: Negative for  shortness of breath.   Cardiovascular: Negative for chest pain.  Gastrointestinal: Negative for nausea, vomiting, abdominal pain and blood in stool.  Genitourinary: Negative for dysuria.  Skin: Negative for rash.  Neurological: Negative for light-headedness.  Hematological: Does not bruise/bleed easily.    Allergies  Sulfonamide derivatives and Amiodarone hcl  Home Medications   Current Outpatient Rx  Name Route Sig Dispense Refill  . ATORVASTATIN CALCIUM 40 MG PO TABS Oral Take 40 mg by mouth daily.      . AZITHROMYCIN 1 % OP SOLN Both Eyes Place 1 drop into both eyes.      Marland Kitchen BIMATOPROST 0.03 % OP SOLN Both Eyes Place 1 drop into both eyes at bedtime.      . CLOPIDOGREL BISULFATE 75 MG PO TABS Oral Take 75 mg by mouth daily.      Marland Kitchen DM-GUAIFENESIN ER 30-600 MG PO TB12 Oral Take 1 tablet by mouth every 12 (twelve) hours.      Marland Kitchen DIGOXIN 0.125 MG PO TABS Oral Take 250 mcg by mouth daily.      . DONEPEZIL HCL 5 MG PO TABS Oral Take 5 mg by mouth at bedtime as  needed.      Marland Kitchen ESOMEPRAZOLE MAGNESIUM 20 MG PO CPDR Oral Take 40 mg by mouth daily before breakfast.     . FERROUS SULFATE 325 (65 FE) MG PO TABS Oral Take 325 mg by mouth daily with breakfast.      . FLUDROCORTISONE ACETATE 0.1 MG PO TABS Oral Take 0.1 mg by mouth 2 (two) times daily.      . FUROSEMIDE 40 MG PO TABS Oral Take 20 mg by mouth daily.      Marland Kitchen GLUCERNA PO LIQD Oral Take 237 mLs by mouth 3 (three) times daily between meals.      Marland Kitchen HALOPERIDOL 0.5 MG PO TABS Oral Take 0.5 mg by mouth 2 (two) times daily.      Marland Kitchen LORAZEPAM 0.5 MG PO TABS Oral Take 1 mg by mouth as needed.     Marland Kitchen MAGNESIUM OXIDE 400 MG PO TABS Oral Take 400 mg by mouth daily.      Marland Kitchen METFORMIN HCL ER (MOD) 500 MG PO TB24 Oral Take 500 mg by mouth 2 (two) times daily.      Marland Kitchen METOPROLOL TARTRATE 50 MG PO TABS Oral Take 1 tablet (50 mg total) by mouth 2 (two) times daily.    Marland Kitchen POLYETHYL GLYCOL-PROPYL GLYCOL 0.4-0.3 % OP SOLN Ophthalmic Apply 1 drop to eye 4 (four)  times daily.      Marland Kitchen POTASSIUM CHLORIDE CRYS ER 20 MEQ PO TBCR Oral Take 20 mEq by mouth daily.      . VENLAFAXINE HCL ER 150 MG PO CP24 Oral Take 150 mg by mouth daily.        BP 125/90  Pulse 92  Temp(Src) 97.5 F (36.4 C) (Oral)  Resp 16  SpO2 98%  Physical Exam  Constitutional: Vital signs are normal. He appears cachectic.  HENT:  Head: Normocephalic.  Eyes: EOM are normal. Pupils are equal, round, and reactive to light.  Neck: Normal range of motion. Neck supple. No JVD present.  Cardiovascular: Normal rate and intact distal pulses.  An irregularly irregular rhythm present.  Pulmonary/Chest: Effort normal and breath sounds normal.  Abdominal: Soft. Bowel sounds are normal. There is no tenderness.  Musculoskeletal: Normal range of motion.  Neurological: He is alert.  Skin: Skin is warm and dry.  Mental Status:  Pt is disoriented to place and time, he is able to name objects.   ED Course  Procedures (including critical care time)  Labs Reviewed  CBC - Abnormal; Notable for the following:    RBC 3.87 (*)    Hemoglobin 10.7 (*)    HCT 34.1 (*)    RDW 15.8 (*)    All other components within normal limits  BASIC METABOLIC PANEL  URINALYSIS, DIPSTICK ONLY  CARDIAC PANEL(CRET KIN+CKTOT+MB+TROPI)   No results found.   No diagnosis found.    MDM  Patient likely with UTI, with d/c home with 7 day course of Ciprofloxacin.

## 2011-05-04 NOTE — ED Notes (Signed)
[id number]<NG> Expected date:05/04/11<BR> Expected time:10:53 AM<BR> Means of arrival:Ambulance<BR> Comments:<BR>

## 2011-05-04 NOTE — ED Notes (Signed)
Per ems: pt states he has had n/v/d all morning. 20 min prior to calling ems last episode of diarrhea/vomiting he had.alert and oriented at base line. Poor skin tugor. Pt ambulates with walker.

## 2011-05-04 NOTE — ED Notes (Signed)
Pt states he started to feel sick yesterday. Pt states has some nausea today but is unable to recall anything else. Pt states at this time he feels ok. Denies any pain

## 2011-05-05 NOTE — ED Provider Notes (Signed)
I saw and evaluated the patient, reviewed the resident's note and I agree with the findings and plan.  I examined the patient along with Dr. Lula Olszewski.  I agree with her note, assessment, and plan with the exception of the fact that the patient does not live at home.  He is the resident of and extended care facility.  He was referred here by the nursing home as he has not been himself this morning.  He appeared well, physical exam was unremarkable, and the workup revealed only a uti.  We will treat this with cipro.  His daughter was here and did not seem to believe that he was off of his baseline.  He will discharged with antibiotics, follow up prn.  Geoffery Lyons, MD 05/05/11 [phone]

## 2011-06-24 ENCOUNTER — Encounter (HOSPITAL_COMMUNITY): Payer: Self-pay | Admitting: Emergency Medicine

## 2011-06-24 ENCOUNTER — Emergency Department (HOSPITAL_COMMUNITY)
Admission: EM | Admit: 2011-06-24 | Discharge: 2011-06-24 | Disposition: A | Discharge status: Skilled Nursing Facility | Payer: MEDICARE | Attending: Emergency Medicine | Admitting: Emergency Medicine

## 2011-06-24 DIAGNOSIS — F0390 Unspecified dementia without behavioral disturbance: Secondary | ICD-10-CM | POA: Insufficient documentation

## 2011-06-24 DIAGNOSIS — S98139A Complete traumatic amputation of one unspecified lesser toe, initial encounter: Secondary | ICD-10-CM | POA: Insufficient documentation

## 2011-06-24 DIAGNOSIS — Z09 Encounter for follow-up examination after completed treatment for conditions other than malignant neoplasm: Secondary | ICD-10-CM | POA: Insufficient documentation

## 2011-06-24 DIAGNOSIS — T148XXA Other injury of unspecified body region, initial encounter: Secondary | ICD-10-CM

## 2011-06-24 DIAGNOSIS — X58XXXA Exposure to other specified factors, initial encounter: Secondary | ICD-10-CM | POA: Insufficient documentation

## 2011-06-24 DIAGNOSIS — E119 Type 2 diabetes mellitus without complications: Secondary | ICD-10-CM | POA: Insufficient documentation

## 2011-06-24 DIAGNOSIS — T792XXA Traumatic secondary and recurrent hemorrhage and seroma, initial encounter: Secondary | ICD-10-CM | POA: Insufficient documentation

## 2011-06-24 HISTORY — DX: Gastro-esophageal reflux disease without esophagitis: K21.9

## 2011-06-24 HISTORY — DX: Heart failure, unspecified: I50.9

## 2011-06-24 LAB — COMPREHENSIVE METABOLIC PANEL
AST: 12 U/L (ref 0–37)
Albumin: 3.2 g/dL — ABNORMAL LOW (ref 3.5–5.2)
Chloride: 101 mEq/L (ref 96–112)
Creatinine, Ser: 1.11 mg/dL (ref 0.50–1.35)
Potassium: 3.3 mEq/L — ABNORMAL LOW (ref 3.5–5.1)
Total Bilirubin: 0.8 mg/dL (ref 0.3–1.2)

## 2011-06-24 LAB — GLUCOSE, CAPILLARY

## 2011-06-24 LAB — CBC
Hemoglobin: 10.7 g/dL — ABNORMAL LOW (ref 13.0–17.0)
MCH: 27 pg (ref 26.0–34.0)
RBC: 3.96 MIL/uL — ABNORMAL LOW (ref 4.22–5.81)

## 2011-06-24 NOTE — ED Provider Notes (Signed)
History     CSN: [id number]  Arrival date & time 06/24/11  0809   First MD Initiated Contact with Patient 06/24/11 [id number]      Chief Complaint  Patient presents with  . Wound Check    pt had blood loss from rt big toe    (Consider location/radiation/quality/duration/timing/severity/associated sxs/prior treatment) HPI Comments: Level 5 caveat due to dementia. NH report R great toe bleeding, concern over 'significant' blood loss. Patient is on Plavix, no coumadin. Bleeding controlled with pressure. Patient states he had partial amputation of R great and 2nd toes as a child while he was chopping wood with an axe.   The history is provided by the nursing home.    Past Medical History  Diagnosis Date  . Cardiac pacemaker in situ     s/p Viatron DDD Pacemaker for sinus node dysfunction  . Orthostatic hypotension   . Hyperlipidemia     mixed  . Atrial fibrillation     not felt to be a coumadin candidate due to falls  . Sinoatrial node dysfunction     s/p PPM (MDT)  . Coronary atherosclerosis of native coronary artery     s/p AWMI 2005 stent LAD  . Diabetes mellitus, type 2   . Chronic renal insufficiency, stage II (mild)   . Intolerance, drug     amniodarone  . Upper GI hemorrhage     2006 Mallory Weiss Tear  . MI (myocardial infarction)     coronary artery status post anterior wall myocardial infarction in 2005 treated with a stent to the left anterior descending.  . Dizziness     recurrent dizziness and falls  . Diabetes mellitus   . Hyperlipidemia   . Renal insufficiency     mild  . Dementia   . Depression   . CHF (congestive heart failure)   . GERD (gastroesophageal reflux disease)     Past Surgical History  Procedure Date  . Pacemaker placement   . Coronary stent placement     Family History  Problem Relation Age of Onset  . Coronary artery disease Brother   . Diabetes Mother     History  Substance Use Topics  . Smoking status: Former Smoker    Quit  date: 03/28/1960  . Smokeless tobacco: Not on file  . Alcohol Use: No      Review of Systems  Unable to perform ROS   Allergies  Sulfonamide derivatives and Amiodarone hcl  Home Medications   Current Outpatient Rx  Name Route Sig Dispense Refill  . ACETAMINOPHEN 500 MG PO TABS Oral Take 1,000 mg by mouth every 6 (six) hours as needed. pain    . BIMATOPROST 0.03 % OP SOLN Both Eyes Place 1 drop into both eyes at bedtime.      . CLOPIDOGREL BISULFATE 75 MG PO TABS Oral Take 75 mg by mouth daily.      Marland Kitchen DEXTROMETHORPHAN POLISTIREX ER 30 MG/5ML PO LQCR Oral Take 30 mg by mouth 2 (two) times daily as needed. For cough    . DIGOXIN 0.25 MG PO TABS Oral Take 250 mcg by mouth daily.    . DONEPEZIL HCL 5 MG PO TABS Oral Take 5 mg by mouth at bedtime.     . OCUSOFT LID SCRUB EX Apply externally Apply topically daily.    Marland Kitchen FERROUS SULFATE 325 (65 FE) MG PO TABS Oral Take 325 mg by mouth daily with breakfast.      . FLUDROCORTISONE ACETATE 0.1 MG  PO TABS Oral Take 0.1 mg by mouth 2 (two) times daily.     . FUROSEMIDE 40 MG PO TABS Oral Take 20 mg by mouth daily.      Marland Kitchen GLUCERNA PO LIQD Oral Take 237 mLs by mouth 3 (three) times daily between meals.      Marland Kitchen HALOPERIDOL 0.5 MG PO TABS Oral Take 0.5 mg by mouth at bedtime.     . IBUPROFEN 400 MG PO TABS Oral Take 400 mg by mouth every 6 (six) hours as needed. pain    . LOPERAMIDE HCL 2 MG PO CAPS Oral Take 2 mg by mouth as needed. diarrhea    . LORAZEPAM 0.5 MG PO TABS Oral Take 1 mg by mouth every 8 (eight) hours as needed. anxiety    . MAGNESIUM OXIDE 400 MG PO TABS Oral Take 400 mg by mouth daily. MAR states that patient takes 420mg .    . METFORMIN HCL ER (MOD) 500 MG PO TB24 Oral Take 500 mg by mouth 2 (two) times daily.      Marland Kitchen METOPROLOL TARTRATE 50 MG PO TABS Oral Take 1 tablet (50 mg total) by mouth 2 (two) times daily.    Marland Kitchen OMEPRAZOLE 20 MG PO CPDR Oral Take 20 mg by mouth daily.    Marland Kitchen POLYETHYL GLYCOL-PROPYL GLYCOL 0.4-0.3 % OP SOLN  Ophthalmic Apply 1 drop to eye 4 (four) times daily.     Marland Kitchen POLYVINYL ALCOHOL 1.4 % OP SOLN Both Eyes Place 1 drop into both eyes daily. Every hour while awake    . POTASSIUM CHLORIDE CRYS ER 20 MEQ PO TBCR Oral Take 20 mEq by mouth daily.      Marland Kitchen ROSUVASTATIN CALCIUM 10 MG PO TABS Oral Take 10 mg by mouth daily.    . VENLAFAXINE HCL ER 150 MG PO CP24 Oral Take 150 mg by mouth daily.        BP 134/79  Pulse 62  Temp(Src) 97.6 F (36.4 C) (Oral)  Resp 16  SpO2 97%  Physical Exam  Nursing note and vitals reviewed. Constitutional: He is oriented to person, place, and time. He appears well-developed and well-nourished.  HENT:  Head: Normocephalic and atraumatic.  Eyes: Conjunctivae are normal. Pupils are equal, round, and reactive to light.  Neck: Normal range of motion. Neck supple.  Pulmonary/Chest: No respiratory distress.  Musculoskeletal: He exhibits no edema and no tenderness.       Patient has healed partial amputation of R 1st and 2nd toes. The skin at the tip of great toe appears dry and cracked. This was the source of the bleeding.   Neurological: He is alert and oriented to person, place, and time.  Skin: Skin is warm and dry.  Psychiatric: He has a normal mood and affect.    ED Course  Procedures (including critical care time)  Labs Reviewed - No data to display No results found.   1. Bleeding from wound     9:02 AM Patient seen and examined. Will check istat.    Vital signs reviewed and are as follows: Filed Vitals:   06/24/11 0811  BP: 134/79  Pulse: 62  Temp: 97.6 F (36.4 C)  Resp: 16   9:32 AM Wound cleaned by myself using dermal cleaner spray. Awaiting istat.   10:55 AM Hbg is normal. Will d/c. Sent with wound care instructions to NH.   MDM  Wound, clean, not infected, hemostatic. Standard wound care indicated at this point. No significant blood loss.  Renne Crigler, [place] 06/24/11 1056

## 2011-06-24 NOTE — ED Notes (Signed)
[id number]<JY> Expected date:06/24/11<BR> Expected time: 7:53 AM<BR> Means of arrival:Ambulance<BR> Comments:<BR> Skin tear

## 2011-06-24 NOTE — ED Notes (Signed)
Pt is from [facility]. Pt had blood loss from crack in big toe. Nursing home staff said blood loss was "concerning."

## 2011-06-24 NOTE — ED Provider Notes (Signed)
Medical screening examination/treatment/procedure(s) were performed by non-physician practitioner and as supervising physician I was immediately available for consultation/collaboration.   Latravion Graves, MD 06/24/11 1510 

## 2011-06-24 NOTE — Discharge Instructions (Signed)
Please read and follow all provided instructions.  Your diagnoses today include:  1. Bleeding from wound    Tests performed today include:  Hemoglobin that was stable at 10.7  Vital signs. See below for your results today.   Medications prescribed:   None  Home care instructions:  Follow any educational materials contained in this packet.  Follow-up instructions: Please follow-up with your primary care provider in the next 3 days for further evaluation of your symptoms. If you do not have a primary care doctor -- see below for referral information.   Return instructions:   Please return to the Emergency Department if you experience worsening symptoms.   Please return if you have any other emergent concerns.  Additional Information:  Your vital signs today were: BP 134/79  Pulse 62  Temp(Src) 97.6 F (36.4 C) (Oral)  Resp 16  SpO2 97% If your blood pressure (BP) was elevated above 135/85 this visit, please have this repeated by your doctor within one month. -------------- No Primary Care Doctor Call [facility]  [phone] Other agencies that provide inexpensive medical care    [facility]  [phone]    [facility]  [phone]    [facility]  [phone]    [facility]  [phone]    [facility]  [phone]    [facility]  [phone] -------------- RESOURCE GUIDE:  Dental Problems  Patients with Medicaid: [facility] [id number]                                            [phone] [address] Phone:  [phone]                                                   Phone:  [phone]  If unable to pay or uninsured, contact:  [facility] or [facility] to become qualified for the adult dental clinic.  Chronic Pain Problems Contact [facility]  [phone] Patients need to be referred by their primary care  doctor.  Insufficient Money for Medicine Contact [employer]:  call "211" or [facility] [phone].  Psychological Services [facility]  [phone] [facility]  [phone] [facility]   [phone] (emergency services [phone])  Substance Abuse Resources [facility]  [phone] [facility] [phone] [facility] [phone] Floydene Flock [phone] Residential & Outpatient Substance Abuse Program  [phone]  Abuse/Neglect [facility] Child Abuse Hotline [phone] [facility] Child Abuse Hotline [phone] (After Hours)  Emergency Shelter [facility] [phone]  M[facility] Homes [facility] [place] [facility] [phone] [facility] [phone]  [facility] Resources  [facility]     [employer]                          [facility] [address] [place]  [address]      [address]  Blondell Reveal Phone:  [phone]                                   Phone:  [phone]                 Phone:  [phone]  [facility] Phone:  [phone]  [facility] Child Abuse Hotline [phone] [phone] (After Hours)

## 2011-06-27 ENCOUNTER — Emergency Department (HOSPITAL_COMMUNITY)
Admission: EM | Admit: 2011-06-27 | Discharge: 2011-06-27 | Disposition: A | Discharge status: Skilled Nursing Facility | Payer: MEDICARE | Attending: Emergency Medicine | Admitting: Emergency Medicine

## 2011-06-27 ENCOUNTER — Emergency Department (HOSPITAL_COMMUNITY): Payer: MEDICARE

## 2011-06-27 ENCOUNTER — Other Ambulatory Visit: Payer: Self-pay

## 2011-06-27 DIAGNOSIS — E785 Hyperlipidemia, unspecified: Secondary | ICD-10-CM | POA: Insufficient documentation

## 2011-06-27 DIAGNOSIS — R002 Palpitations: Secondary | ICD-10-CM | POA: Insufficient documentation

## 2011-06-27 DIAGNOSIS — Z79899 Other long term (current) drug therapy: Secondary | ICD-10-CM | POA: Insufficient documentation

## 2011-06-27 DIAGNOSIS — R079 Chest pain, unspecified: Secondary | ICD-10-CM | POA: Insufficient documentation

## 2011-06-27 DIAGNOSIS — E119 Type 2 diabetes mellitus without complications: Secondary | ICD-10-CM | POA: Insufficient documentation

## 2011-06-27 DIAGNOSIS — I2510 Atherosclerotic heart disease of native coronary artery without angina pectoris: Secondary | ICD-10-CM | POA: Insufficient documentation

## 2011-06-27 DIAGNOSIS — I509 Heart failure, unspecified: Secondary | ICD-10-CM | POA: Insufficient documentation

## 2011-06-27 LAB — BASIC METABOLIC PANEL
Chloride: 103 mEq/L (ref 96–112)
Creatinine, Ser: 1.14 mg/dL (ref 0.50–1.35)
GFR calc Af Amer: 65 mL/min — ABNORMAL LOW (ref >90)
GFR calc non Af Amer: 56 mL/min — ABNORMAL LOW (ref >90)
Potassium: 3.6 mEq/L (ref 3.5–5.1)

## 2011-06-27 LAB — CBC
MCH: 26.6 pg (ref 26.0–34.0)
Platelets: 153 10*3/uL (ref 150–400)
RBC: 3.8 MIL/uL — ABNORMAL LOW (ref 4.22–5.81)
WBC: 5.3 10*3/uL (ref 4.0–10.5)

## 2011-06-27 LAB — DIGOXIN LEVEL: Digoxin Level: 0.8 ng/mL (ref 0.8–2.0)

## 2011-06-27 LAB — GLUCOSE, CAPILLARY: Glucose-Capillary: 93 mg/dL (ref 70–99)

## 2011-06-27 LAB — TROPONIN I: Troponin I: <0.3 ng/mL (ref <0.30)

## 2011-06-27 NOTE — ED Notes (Addendum)
Pt comes from [facility] nursing home.  Pt reports his pacemaker "malfunctioning" for a week but was unable to describe what was wrong with it.  Nursing home was requesting him  to be seen due to not having the necessary equipment to test. Per EMS: [facility]G 57

## 2011-06-27 NOTE — ED Provider Notes (Signed)
History     CSN: [id number]  Arrival date & time 06/27/11  0330   First MD Initiated Contact with Patient 06/27/11 [phone]      Chief Complaint  Patient presents with  . Pacemaker Problem     The history is provided by the nursing home and the EMS personnel (Nursing home provider). History Limited By: Level V caveat: Dementia.   patient has a history of atrial fibrillation and he came to the nursing station tonight and reported that he had chest pain.  When the nurse asked him how long his chest pain in the neck he said one week.  Then she asked him another question about his chest pain he reports he had no chest pain but was concerned that maybe his pacemaker was not working.  The patient was unable to elaborate any further.  The patient otherwise has been doing well over the past several days.  His been no report of vomiting or diarrhea.  He's been eating normally.  He is a DO NOT RESUSCITATE.  He has a history of SA dysfunction status post pacemaker.  He has a history of atrial fibrillation on digoxin and Plavix.  He has a history of diabetes, coronary artery disease, hyperlipidemia, stented coronary artery, hyperlipidemia, congestive heart failure.  He is currently not Coumadin secondary to falls  Past Medical History  Diagnosis Date  . Cardiac pacemaker in situ     s/p Viatron DDD Pacemaker for sinus node dysfunction  . Orthostatic hypotension   . Hyperlipidemia     mixed  . Atrial fibrillation     not felt to be a coumadin candidate due to falls  . Sinoatrial node dysfunction     s/p PPM (MDT)  . Coronary atherosclerosis of native coronary artery     s/p AWMI 2005 stent LAD  . Diabetes mellitus, type 2   . Chronic renal insufficiency, stage II (mild)   . Intolerance, drug     amniodarone  . Upper GI hemorrhage     2006 Mallory Weiss Tear  . MI (myocardial infarction)     coronary artery status post anterior wall myocardial infarction in 2005 treated with a stent to the left  anterior descending.  . Dizziness     recurrent dizziness and falls  . Diabetes mellitus   . Hyperlipidemia   . Renal insufficiency     mild  . Dementia   . Depression   . CHF (congestive heart failure)   . GERD (gastroesophageal reflux disease)     Past Surgical History  Procedure Date  . Pacemaker placement   . Coronary stent placement     Family History  Problem Relation Age of Onset  . Coronary artery disease Brother   . Diabetes Mother     History  Substance Use Topics  . Smoking status: Former Smoker    Quit date: 03/28/1960  . Smokeless tobacco: Not on file  . Alcohol Use: No      Review of Systems  Unable to perform ROS   Allergies  Sulfonamide derivatives and Amiodarone hcl  Home Medications   Current Outpatient Rx  Name Route Sig Dispense Refill  . BIMATOPROST 0.03 % OP SOLN Both Eyes Place 1 drop into both eyes at bedtime.      . CLOPIDOGREL BISULFATE 75 MG PO TABS Oral Take 75 mg by mouth daily.      Marland Kitchen DIGOXIN 0.25 MG PO TABS Oral Take 250 mcg by mouth daily.    Marland Kitchen  DONEPEZIL HCL 5 MG PO TABS Oral Take 5 mg by mouth at bedtime.     . OCUSOFT LID SCRUB EX Apply externally Apply 1 each topically 2 (two) times daily. Apply 1 pad to each eye    . FERROUS SULFATE 325 (65 FE) MG PO TABS Oral Take 325 mg by mouth daily with breakfast.      . FLUDROCORTISONE ACETATE 0.1 MG PO TABS Oral Take 0.1 mg by mouth 2 (two) times daily.     . FUROSEMIDE 20 MG PO TABS Oral Take 20 mg by mouth daily.    Marland Kitchen HALOPERIDOL 0.5 MG PO TABS Oral Take 0.5 mg by mouth at bedtime.     Marland Kitchen MAGNESIUM OXIDE 400 MG PO TABS Oral Take 400 mg by mouth daily. MAR states that patient takes 420mg .    . METFORMIN HCL 500 MG PO TABS Oral Take 500 mg by mouth 2 (two) times daily with a meal.    . METOPROLOL TARTRATE 50 MG PO TABS Oral Take 1 tablet (50 mg total) by mouth 2 (two) times daily.    Marland Kitchen OMEPRAZOLE 20 MG PO CPDR Oral Take 20 mg by mouth daily.    Marland Kitchen POLYETHYL GLYCOL-PROPYL GLYCOL  0.4-0.3 % OP SOLN Ophthalmic Apply 1 drop to eye 4 (four) times daily.     Marland Kitchen POLYVINYL ALCOHOL 1.4 % OP SOLN Both Eyes Place 1 drop into both eyes daily. Every hour while awake    . POTASSIUM CHLORIDE CRYS ER 20 MEQ PO TBCR Oral Take 20 mEq by mouth daily.      Marland Kitchen ROSUVASTATIN CALCIUM 10 MG PO TABS Oral Take 5 mg by mouth daily.     . VENLAFAXINE HCL ER 150 MG PO CP24 Oral Take 150 mg by mouth daily.      . ACETAMINOPHEN 500 MG PO TABS Oral Take 1,000 mg by mouth every 6 (six) hours as needed. pain    . DEXTROMETHORPHAN POLISTIREX ER 30 MG/5ML PO LQCR Oral Take 30 mg by mouth 2 (two) times daily as needed. For cough    . GLUCERNA PO LIQD Oral Take 237 mLs by mouth 3 (three) times daily between meals.      . IBUPROFEN 400 MG PO TABS Oral Take 400 mg by mouth every 6 (six) hours as needed. pain    . LOPERAMIDE HCL 2 MG PO CAPS Oral Take 2 mg by mouth as needed. diarrhea    . LORAZEPAM 0.5 MG PO TABS Oral Take 1 mg by mouth every 8 (eight) hours as needed. anxiety      BP 131/81  Pulse 84  Temp(Src) 98.3 F (36.8 C) (Oral)  Resp 18  SpO2 100%  Physical Exam  Nursing note and vitals reviewed. Constitutional: He appears well-developed and well-nourished.  HENT:  Head: Normocephalic and atraumatic.  Eyes: EOM are normal.  Neck: Normal range of motion.  Cardiovascular: Normal rate, normal heart sounds and intact distal pulses.        Irregularly irregular  Pulmonary/Chest: Effort normal and breath sounds normal. No respiratory distress.  Abdominal: Soft. He exhibits no distension. There is no tenderness.  Musculoskeletal: Normal range of motion.  Neurological: He is alert.       He answers simple questions.  He believes he lives at home with his wife.  Per the nursing home records he is widowed and lives in a nursing home  Skin: Skin is warm and dry.  Psychiatric: He has a normal mood and affect. Judgment normal.  ED Course  Procedures (including critical care time)   Date:  06/27/2011  Rate: 101  Rhythm: Atrial fibrillation with controlled rate  QRS Axis: normal  Intervals: normal  ST/T Wave abnormalities: Nonspecific ST and T wave changes  Conduction Disutrbances: none  Narrative Interpretation:   Old EKG Reviewed: No significant changes noted    Labs Reviewed  CBC - Abnormal; Notable for the following:    RBC 3.80 (*)    Hemoglobin 10.1 (*)    HCT 33.0 (*)    RDW 17.0 (*)    All other components within normal limits  BASIC METABOLIC PANEL - Abnormal; Notable for the following:    BUN 24 (*)    GFR calc non Af Amer 56 (*)    GFR calc Af Amer 65 (*)    All other components within normal limits  GLUCOSE, CAPILLARY  TROPONIN I  DIGOXIN LEVEL  PROTIME-INR   Dg Chest 2 View  06/27/2011  *RADIOLOGY REPORT*  Clinical Data: Malfunctioning pacemaker.  CHEST - 2 VIEW  Comparison: 03/02/2010  Findings: Dual lead cardiac pacemaker with lead tips over the RA and RV regions.  No significant change in appearance or position since the previous study.  Mild cardiac enlargement with normal pulmonary vascularity.  No focal airspace consolidation.  No blunting of costophrenic angles.  No pneumothorax.  Calcification of the aorta.  Stable appearance since previous study.  IMPRESSION: No evidence of active pulmonary disease.  Original Report Authenticated By: Marlon Pel, M.D.     1. Palpitations       MDM  The patient is well-appearing.  He reports no chest pain at this time.  He has no palpitations.  His EKG demonstrates atrial fibrillation.  He is on digoxin and Plavix.  His not on Coumadin.  Baseline labs and a troponin will be obtained.  Chest x-ray pending.  The patient reports no chest pain at this time  Dc back to nursing home       Lyanne Co, MD 06/27/11 [phone]

## 2011-06-27 NOTE — ED Notes (Signed)
PT states he has been feeling "funny" for a week.  States his pacemaker isn't working right.  Pt afib on monitor with occasional pacing.  Denies pain.  Pt alert to self only.  States JFK is still the president and that he lives at home when he actually lives in a nursing home.

## 2011-06-27 NOTE — ED Notes (Signed)
Pt assisted to with urinal.  HR increased to 120's.  Reduced when pt returned to laying.

## 2011-06-27 NOTE — ED Notes (Addendum)
Pt requesting and given snack.  Continues to deny chest pain.

## 2011-06-27 NOTE — ED Notes (Signed)
EMS: BP: 130/88 HR: 88 R: 16

## 2011-06-27 NOTE — ED Notes (Signed)
Pt back from X-ray.  Continues to deny pain.

## 2011-06-28 ENCOUNTER — Emergency Department (HOSPITAL_COMMUNITY): Payer: MEDICARE

## 2011-06-28 ENCOUNTER — Encounter (HOSPITAL_COMMUNITY): Payer: Self-pay | Admitting: Radiology

## 2011-06-28 ENCOUNTER — Emergency Department (HOSPITAL_COMMUNITY)
Admission: EM | Admit: 2011-06-28 | Discharge: 2011-06-28 | Disposition: A | Discharge status: Skilled Nursing Facility | Payer: MEDICARE | Attending: Emergency Medicine | Admitting: Emergency Medicine

## 2011-06-28 DIAGNOSIS — E785 Hyperlipidemia, unspecified: Secondary | ICD-10-CM | POA: Insufficient documentation

## 2011-06-28 DIAGNOSIS — E162 Hypoglycemia, unspecified: Secondary | ICD-10-CM

## 2011-06-28 DIAGNOSIS — S91109A Unspecified open wound of unspecified toe(s) without damage to nail, initial encounter: Secondary | ICD-10-CM | POA: Insufficient documentation

## 2011-06-28 DIAGNOSIS — E1169 Type 2 diabetes mellitus with other specified complication: Secondary | ICD-10-CM | POA: Insufficient documentation

## 2011-06-28 DIAGNOSIS — K219 Gastro-esophageal reflux disease without esophagitis: Secondary | ICD-10-CM | POA: Insufficient documentation

## 2011-06-28 DIAGNOSIS — I509 Heart failure, unspecified: Secondary | ICD-10-CM | POA: Insufficient documentation

## 2011-06-28 DIAGNOSIS — F3289 Other specified depressive episodes: Secondary | ICD-10-CM | POA: Insufficient documentation

## 2011-06-28 DIAGNOSIS — F068 Other specified mental disorders due to known physiological condition: Secondary | ICD-10-CM | POA: Insufficient documentation

## 2011-06-28 DIAGNOSIS — W19XXXA Unspecified fall, initial encounter: Secondary | ICD-10-CM | POA: Insufficient documentation

## 2011-06-28 DIAGNOSIS — S51019A Laceration without foreign body of unspecified elbow, initial encounter: Secondary | ICD-10-CM

## 2011-06-28 DIAGNOSIS — Z950 Presence of cardiac pacemaker: Secondary | ICD-10-CM | POA: Insufficient documentation

## 2011-06-28 DIAGNOSIS — S51009A Unspecified open wound of unspecified elbow, initial encounter: Secondary | ICD-10-CM | POA: Insufficient documentation

## 2011-06-28 DIAGNOSIS — F329 Major depressive disorder, single episode, unspecified: Secondary | ICD-10-CM | POA: Insufficient documentation

## 2011-06-28 DIAGNOSIS — Z79899 Other long term (current) drug therapy: Secondary | ICD-10-CM | POA: Insufficient documentation

## 2011-06-28 DIAGNOSIS — S91209A Unspecified open wound of unspecified toe(s) with damage to nail, initial encounter: Secondary | ICD-10-CM

## 2011-06-28 DIAGNOSIS — Y9210 Unspecified residential institution as the place of occurrence of the external cause: Secondary | ICD-10-CM | POA: Insufficient documentation

## 2011-06-28 DIAGNOSIS — M79609 Pain in unspecified limb: Secondary | ICD-10-CM | POA: Insufficient documentation

## 2011-06-28 DIAGNOSIS — I252 Old myocardial infarction: Secondary | ICD-10-CM | POA: Insufficient documentation

## 2011-06-28 DIAGNOSIS — I4891 Unspecified atrial fibrillation: Secondary | ICD-10-CM | POA: Insufficient documentation

## 2011-06-28 DIAGNOSIS — I2510 Atherosclerotic heart disease of native coronary artery without angina pectoris: Secondary | ICD-10-CM | POA: Insufficient documentation

## 2011-06-28 LAB — DIFFERENTIAL
Basophils Absolute: 0 10*3/uL (ref 0.0–0.1)
Basophils Relative: 0 % (ref 0–1)
Eosinophils Absolute: 0.1 10*3/uL (ref 0.0–0.7)
Eosinophils Relative: 2 % (ref 0–5)
Monocytes Absolute: 0.9 10*3/uL (ref 0.1–1.0)
Neutro Abs: 2.9 10*3/uL (ref 1.7–7.7)

## 2011-06-28 LAB — COMPREHENSIVE METABOLIC PANEL
AST: 17 U/L (ref 0–37)
Albumin: 3.1 g/dL — ABNORMAL LOW (ref 3.5–5.2)
Alkaline Phosphatase: 67 U/L (ref 39–117)
BUN: 26 mg/dL — ABNORMAL HIGH (ref 6–23)
Calcium: 8.5 mg/dL (ref 8.4–10.5)
Chloride: 103 mEq/L (ref 96–112)
Creatinine, Ser: 1.07 mg/dL (ref 0.50–1.35)
GFR calc Af Amer: 70 mL/min — ABNORMAL LOW (ref >90)
GFR calc non Af Amer: 60 mL/min — ABNORMAL LOW (ref >90)
Total Bilirubin: 0.5 mg/dL (ref 0.3–1.2)
Total Protein: 6.5 g/dL (ref 6.0–8.3)

## 2011-06-28 LAB — CBC
HCT: 29.1 % — ABNORMAL LOW (ref 39.0–52.0)
MCH: 27.9 pg (ref 26.0–34.0)
MCHC: 32 g/dL (ref 30.0–36.0)
RDW: 17.1 % — ABNORMAL HIGH (ref 11.5–15.5)

## 2011-06-28 LAB — GLUCOSE, CAPILLARY: Glucose-Capillary: 63 mg/dL — ABNORMAL LOW (ref 70–99)

## 2011-06-28 MED ORDER — DEXTROSE 50 % IV SOLN
INTRAVENOUS | Status: AC
  Administered 2011-06-28: 25 mL
  Filled 2011-06-28: qty 50

## 2011-06-28 NOTE — Discharge Instructions (Signed)
Change dressings daily.  Hypoglycemia (Low Blood Sugar) Hypoglycemia is when the glucose (sugar) in your blood is too low. Hypoglycemia can happen for many reasons. It can happen to people with or without diabetes. Hypoglycemia can develop quickly and can be a medical emergency.  CAUSES  Having hypoglycemia does not mean that you will develop diabetes. Different causes include:  Missed or delayed meals or not enough carbohydrates eaten.   Medication overdose. This could be by accident or deliberate. If by accident, your medication may need to be adjusted or changed.   Exercise or increased activity without adjustments in carbohydrates or medications.   A nerve disorder that affects body functions like your heart rate, blood pressure and digestion (autonomic neuropathy).   A condition where the stomach muscles do not function properly (gastroparesis). Therefore, medications may not absorb properly.   The inability to recognize the signs of hypoglycemia (hypoglycemic unawareness).   Absorption of insulin - may be altered.   Alcohol consumption.   Pregnancy/menstrual cycles/postpartum. This may be due to hormones.   Certain kinds of tumors. This is very rare.  SYMPTOMS   Sweating.   Hunger.   Dizziness.   Blurred vision.   Drowsiness.   Weakness.   Headache.   Rapid heart beat.   Shakiness.   Nervousness.  DIAGNOSIS  Diagnosis is made by monitoring blood glucose in one or all of the following ways:  Fingerstick blood glucose monitoring.   Laboratory results.  TREATMENT  If you think your blood glucose is low:  Check your blood glucose, if possible. If it is less than 70 mg/dl, take one of the following:   3-4 glucose tablets.    cup juice (prefer clear like apple).    cup "regular" soda pop.   1 cup milk.   -1 tube of glucose gel.   5-6 hard candies.   Do not over treat because your blood glucose (sugar) will only go too high.   Wait 15 minutes  and recheck your blood glucose. If it is still less than 70 mg/dl (or below your target range), repeat treatment.   Eat a snack if it is more than one hour until your next meal.  Sometimes, your blood glucose may go so low that you are unable to treat yourself. You may need someone to help you. You may even pass out or be unable to swallow. This may require you to get an injection of glucagon, which raises the blood glucose. HOME CARE INSTRUCTIONS  Check blood glucose as recommended by your caregiver.   Take medication as prescribed by your caregiver.   Follow your meal plan. Do not skip meals. Eat on time.   If you are going to drink alcohol, drink it only with meals.   Check your blood glucose before driving.   Check your blood glucose before and after exercise. If you exercise longer or different than usual, be sure to check blood glucose more frequently.   Always carry treatment with you. Glucose tablets are the easiest to carry.   Always wear medical alert jewelry or carry some form of identification that states that you have diabetes. This will alert people that you have diabetes. If you have hypoglycemia, they will have a better idea on what to do.  SEEK MEDICAL CARE IF:   You are having problems keeping your blood sugar at target range.   You are having frequent episodes of hypoglycemia.   You feel you might be having side effects from  your medicines.   You have symptoms of an illness that is not improving after 3-4 days.   You notice a change in vision or a new problem with your vision.  SEEK IMMEDIATE MEDICAL CARE IF:   You are a family member or friend of a person whose blood glucose goes below 70 mg/dl and is accompanied by:   Confusion.   A change in mental status.   The inability to swallow.   Passing out.  Document Released: 03/14/2005 Document Revised: 03/03/2011 Document Reviewed: 11/06/2008 [employer] Patient Information 2012 [employer]3  Home  Safety and Preventing Falls Falls are a leading cause of injury and while they affect all age groups, falls have greater short-term and long-term impact on older age groups. However, falls should not be a part of life or aging. It is possible for individuals and their families to use preventive measures to significantly decrease the likelihood that anyone, especially an older adult, will fall. There are many simple measures which can make your home safer with respect to preventing falls. The following actions can help reduce falls among all members of your family and are especially important as you age, when your balance, lower limb strength, coordination, and eyesight may be declining. The use of preventive measures will help to reduce you and your family's risk of falls and serious medical consequences. OUTDOORS  Repair cracks and edges of walkways and driveways.   Remove high doorway thresholds and trim shrubbery on the main path into your home.   Ensure there is good outside lighting at main entrances and along main walkways.   Clear walkways of tools, rocks, debris, and clutter.   Check that handrails are not broken and are securely fastened. Both sides of steps should have handrails.   In the garage, be attentive to and clean up grease or oil spills on the cement. This can make the surface extremely slippery.   In winter, have leaves, snow, and ice cleared regularly.   Use sand or salt on walkways during winter months.  BATHROOM  Install grab bars by the toilet and in the tub and shower.   Use non-skid mats or decals in the tub or shower.   If unable to easily stand unsupported while showering, place a plastic non slip stool in the shower to sit on when needed.   Install night lights.   Keep floors dry and clean up all water on the floor immediately.   Remove soap buildup in tub or shower on a regular basis.   Secure bath mats with non-slip, double-sided rug tape.   Remove  tripping hazards from the floors.  BEDROOMS  Install night lights.   Do not use oversized bedding.   Make sure a bedside light is easy to reach.   Keep a telephone by your bedside.   Make sure that you can get in and out of your bed easily.   Have a firm chair, with side arms, to use for getting dressed.   Remove clutter from around closets.   Store clothing, bed coverings, and other household items where you can reach them comfortably.   Remove tripping hazards from the floor.  LIVING AREAS AND STAIRWAYS  Turn on lights to avoid having to walk through dark areas.   Keep lighting uniform in each room. Place brighter lightbulbs in darker areas, including stairways.   Replace lightbulbs that burn out in stairways immediately.   Arrange furniture to provide for clear pathways.   Keep furniture  in the same place.   Eliminate or tape down electrical cables in high traffic areas.   Place handrails on both sides of stairways. Use handrails when going up or down stairs.   Most falls occur on the top or bottom 3 steps.   Fix any loose handrails. Make sure handrails on both sides of the stairways are as long as the stairs.   Remove all walkway obstacles.   Coil or tape electrical cords off to the side of walking areas and out of the way. If using many extension cords, have an electrician put in a new wall outlet to reduce or eliminate them.   Make sure spills are cleaned up quickly and allow time for drying before walking on freshly cleaned floors.   Firmly attach carpet with non-skid or two-sided tape.   Keep frequently used items within easy reach.   Remove tripping hazards such as throw rugs and clutter in walkways. Never leave objects on stairs.   Get rid of throw rugs elsewhere if possible.   Eliminate uneven floor surfaces.   Make sure couches and chairs are easy to get into and out of.   Check carpeting to make sure it is firmly attached along stairs.   Make  repairs to worn or loose carpet promptly.   Select a carpet pattern that does not visually hide the edge of steps.   Avoid placing throw rugs or scatter rugs at the top or bottom of stairways, or properly secure with carpet tape to prevent slippage.   Have an electrician put in a light switch at the top and bottom of the stairs.   Get light switches that glow.   Avoid the following practices: hurrying, inattention, obscured vision, carrying large loads, and wearing slip-on shoes.   Be aware of all pets.  KITCHEN  Place items that are used frequently, such as dishes and food, within easy reach.   Keep handles on pots and pans toward the center of the stove. Use back burners when possible.   Make sure spills are cleaned up quickly and allow time for drying.   Avoid walking on wet floors.   Avoid hot utensils and knives.   Position shelves so they are not too high or low.   Place commonly used objects within easy reach.   If necessary, use a sturdy step stool with a grab bar when reaching.   Make sure electrical cables are out of the way.   Do not use floor polish or wax that makes floors slippery.  OTHER HOME FALL PREVENTION STRATEGIES  Wear low heel or rubber sole shoes that are supportive and fit well.   Wear closed toe shoes.   Know and watch for side effects of medications. Have your caregiver or pharmacist look at all your medicines, even over-the-counter medicines. Some medicines can make you sleepy or dizzy.   Exercise regularly. Exercise makes you stronger and improves your balance and coordination.   Limit use of alcohol.   Use eyeglasses if necessary and keep them clean. Have your vision checked every year.   Organize your household in a manner that minimizes the need to walk distances when hurried, or go up and down stairs unnecessarily. For example, have a phone placed on at least each floor of your home. If possible, have a phone beside each sitting or lying  area where you spend the most time at home. Keep emergency numbers posted at all phones.   Use non-skid floor wax.  When using a ladder, make sure:   The base is firm.   All ladder feet are on level ground.   The ladder is angled against the wall properly.   When climbing a ladder, face the ladder and hold the ladder rungs firmly.   If reaching, always keep your hips and body weight centered between the rails.   When using a stepladder, make sure it is fully opened and both spreaders are firmly locked.   Do not climb a closed stepladder.   Avoid climbing beyond the second step from the top of a stepladder and the 4th rung from the top of an extension ladder.   Learn and use mobility aids as needed.   Change positions slowly. Arise slowly from sitting and lying positions. Sit on the edge of your bed before getting to your feet.   If you have a history of falls, ask someone to add color or contrast paint or tape to grab bars and handrails in your home.   If you have a history of falls, ask someone to place contrasting color strips on first and last steps.   Install an electrical emergency response system if you need one, and know how to use it.   If you have a medical or other condition that causes you to have limited physical strength, it is important that you reach out to family and friends for occasional help.  FOR CHILDREN:  If young children are in the home, use safety gates. At the top of stairs use screw-mounted gates; use pressure-mounted gates for the bottom of the stairs and doorways between rooms.   Young children should be taught to descend stairs on their stomachs, feet first, and later using the handrail.   Keep drawers fully closed to prevent them from being climbed on or pulled out entirely.   Move chairs, cribs, beds and other furniture away from windows.   Consider installing window guards on windows ground floor and up, unless they are emergency fire  exits. Make sure they have easy release mechanisms.   Consider installing special locks that only allow the window to be opened to a certain height.   Never rely on window screens to prevent falls.   Never leave babies alone on changing tables, beds or sofas. Use a changing table that has a restraining strap.   When a child can pull to a standing position, the crib mattress should be adjusted to its lowest position. There should be at least 26 inches between the top rails of the crib drop side and the mattress. Toys, bumper pads, and other objects that can be used as steps to climb out should be removed from the crib.   On bunk beds never allow a child under age 4 to sleep on the top bunk. For older children, if the upper bunk is not against a wall, use guard rails on both sides. No matter how old a child is, keep the guard rails in place on the top bunk since children roll during sleep. Do not permit horseplay on bunks.   Grass and soil surfaces beneath backyard playground equipment should be replaced with hardwood chips, shredded wood mulch, sand, pea gravel, rubber, crushed stone, or another safer material at depths of at least 9 to 12 inches.   When riding bikes or using skates, skateboards, skis, or snowboards, require children to wear helmets. Look for those that have stickers stating that they meet or exceed safety standards.   Vertical posts  or pickets in deck, balcony, and stairway railings should be no more than 3 1/2 inches apart if a young baby will have access to the area. The space between horizontal rails or bars, and between the floor and the first horizontal rail or bar, should be no more than 3 1/2 inches.  Document Released: 03/04/2002 Document Revised: 03/03/2011 Document Reviewed: 01/01/2009 [facility] Patient Information 2012 [place], [place].  Skin Tear Laceration Care A skin tear is when the top layer of skin has been peeled off. This is a common problem with aging as the  skin becomes thinner and more fragile. This is often repaired with tape or skin adhesive strips. This keeps the skin that has been peeled off in contact with the healthier skin beneath. Sometimes the peeled skin will grow back on but often it turns black and dies. Even when this happens, the torn skin acts as a good dressing until the skin underneath gets healthier and repairs itself. HOME CARE INSTRUCTIONS   Only take over-the-counter or prescription medicines for pain, discomfort or fever as directed by your caregiver.   A dressing, depending on the location of the laceration, may have been applied over the tape or skin adhesive strips. This may be changed once per day or as instructed. If the dressing sticks, it may be soaked off with warm soapy water.  You might need a tetanus shot now if:  You have no idea when you had the last one.   You have never had a tetanus shot before.   Your wound had dirt in it.  If you need a tetanus shot, and you choose not to get one, there is a rare chance of getting tetanus. Sickness from tetanus can be serious. If you got a tetanus shot, your arm may swell, get red and warm to the touch at the shot site. This is common and not a problem. SEEK IMMEDIATE MEDICAL CARE IF:   There is redness, swelling or increasing pain in the wound.   Pus is coming from wound.   An unexplained oral temperature above 102 F (38.9 C) develops.   You notice a bad smell coming from the wound or dressing.  Document Released: 12/07/2000 Document Revised: 03/03/2011 Document Reviewed: 06/16/2008 [facility] Patient Information 2012 [place], [place].

## 2011-06-28 NOTE — ED Notes (Signed)
Applesauce given after the D50

## 2011-06-28 NOTE — ED Provider Notes (Signed)
History     CSN: [id number]  Arrival date & time 06/28/11  0442   First MD Initiated Contact with Patient 06/28/11 0540      Chief Complaint  Patient presents with  . Fall    (Consider location/radiation/quality/duration/timing/severity/associated sxs/prior treatment) Patient is a 78 y.o. male presenting with fall. The history is provided by the nursing home. The history is limited by the condition of the patient (dementia).  Fall  He was reported to a fallen at the nursing home. He states he fell in the bathroom. He suffered skin tears to his elbows an injury to his right first toe. He denies hitting his head and denies loss of consciousness. He is not complaining of any pain anywhere. Is not sure how he fell.  Past Medical History  Diagnosis Date  . Cardiac pacemaker in situ     s/p Viatron DDD Pacemaker for sinus node dysfunction  . Orthostatic hypotension   . Hyperlipidemia     mixed  . Atrial fibrillation     not felt to be a coumadin candidate due to falls  . Sinoatrial node dysfunction     s/p PPM (MDT)  . Coronary atherosclerosis of native coronary artery     s/p AWMI 2005 stent LAD  . Diabetes mellitus, type 2   . Chronic renal insufficiency, stage II (mild)   . Intolerance, drug     amniodarone  . Upper GI hemorrhage     2006 Mallory Weiss Tear  . MI (myocardial infarction)     coronary artery status post anterior wall myocardial infarction in 2005 treated with a stent to the left anterior descending.  . Dizziness     recurrent dizziness and falls  . Diabetes mellitus   . Hyperlipidemia   . Renal insufficiency     mild  . Dementia   . Depression   . CHF (congestive heart failure)   . GERD (gastroesophageal reflux disease)     Past Surgical History  Procedure Date  . Pacemaker placement   . Coronary stent placement     Family History  Problem Relation Age of Onset  . Coronary artery disease Brother   . Diabetes Mother     History  Substance  Use Topics  . Smoking status: Former Smoker    Quit date: 03/28/1960  . Smokeless tobacco: Not on file  . Alcohol Use: No      Review of Systems  Unable to perform ROS: Dementia    Allergies  Sulfonamide derivatives and Amiodarone hcl  Home Medications   Current Outpatient Rx  Name Route Sig Dispense Refill  . BIMATOPROST 0.03 % OP SOLN Both Eyes Place 1 drop into both eyes at bedtime.      . CLOPIDOGREL BISULFATE 75 MG PO TABS Oral Take 75 mg by mouth daily.      Marland Kitchen GLUCOSE 40 % PO GEL Oral Take 1 Tube by mouth once as needed. For glucose levels < 70    . DIGOXIN 0.25 MG PO TABS Oral Take 250 mcg by mouth daily.    . DONEPEZIL HCL 5 MG PO TABS Oral Take 5 mg by mouth at bedtime.     . OCUSOFT LID SCRUB EX Apply externally Apply 1 each topically 2 (two) times daily. Apply 1 pad to each eye    . FERROUS SULFATE 325 (65 FE) MG PO TABS Oral Take 325 mg by mouth daily with breakfast.      . FLUDROCORTISONE ACETATE 0.1 MG PO  TABS Oral Take 0.1 mg by mouth 2 (two) times daily.     . FUROSEMIDE 20 MG PO TABS Oral Take 20 mg by mouth daily.    Marland Kitchen HALOPERIDOL 0.5 MG PO TABS Oral Take 0.5 mg by mouth at bedtime.     . IBUPROFEN 400 MG PO TABS Oral Take 400 mg by mouth every 6 (six) hours as needed. pain    . MAGNESIUM OXIDE 400 MG PO TABS Oral Take 400 mg by mouth daily. MAR states that patient takes 420mg .    Marland Kitchen METFORMIN HCL 500 MG PO TABS Oral Take 500 mg by mouth 2 (two) times daily with a meal.    . METOPROLOL TARTRATE 50 MG PO TABS Oral Take 1 tablet (50 mg total) by mouth 2 (two) times daily.    Marland Kitchen OMEPRAZOLE 20 MG PO CPDR Oral Take 20 mg by mouth daily.    Marland Kitchen POLYETHYL GLYCOL-PROPYL GLYCOL 0.4-0.3 % OP SOLN Ophthalmic Apply 1 drop to eye 4 (four) times daily.     Marland Kitchen POTASSIUM CHLORIDE CRYS ER 20 MEQ PO TBCR Oral Take 20 mEq by mouth daily.      Marland Kitchen ROSUVASTATIN CALCIUM 10 MG PO TABS Oral Take 5 mg by mouth daily.     . VENLAFAXINE HCL ER 150 MG PO CP24 Oral Take 150 mg by mouth daily.       . ACETAMINOPHEN 500 MG PO TABS Oral Take 1,000 mg by mouth every 6 (six) hours as needed. pain    . DEXTROMETHORPHAN POLISTIREX ER 30 MG/5ML PO LQCR Oral Take 30 mg by mouth 2 (two) times daily as needed. For cough    . LOPERAMIDE HCL 2 MG PO CAPS Oral Take 2 mg by mouth as needed. diarrhea    . LORAZEPAM 0.5 MG PO TABS Oral Take 1 mg by mouth every 8 (eight) hours as needed. anxiety    . POLYVINYL ALCOHOL 1.4 % OP SOLN Both Eyes Place 1 drop into both eyes daily. Every hour while awake      BP 147/85  Pulse 90  Temp(Src) 97.2 F (36.2 C) (Oral)  Resp 20  SpO2 100%  Physical Exam  Nursing note and vitals reviewed.  78 year old male who is resting comfortably and in no acute distress. Vital signs are significant for mild hypertension with blood pressure 147/85. Oxygen saturation is 100% which is normal. Head is normocephalic and atraumatic. PERRLA, EOMI. Neck is nontender. Back is nontender. Lungs are clear without rales, wheezes, or rhonchi. Heart has regular rate and rhythm without murmur. Abdomen is soft, flat, nontender without masses or hepatosplenomegaly. Extremities: Minor superficial skin tears are present over the dorsal aspect of both elbows. There is bleeding from the nailbed of the right first toe with partial avulsion of the toenail. There is no cyanosis or edema. Skin is otherwise warm and dry without rash other than senile purpura. Neurologic: He is oriented to person and place but not to time, cranial nerves are intact, there no focal motor or sensory deficits.  ED Course  Procedures (including critical care time)  Results for orders placed during the hospital encounter of 06/28/11  GLUCOSE, CAPILLARY      Component Value Range   Glucose-Capillary 63 (*) 70 - 99 (mg/dL)  CBC      Component Value Range   WBC 4.6  4.0 - 10.5 (K/uL)   RBC 3.33 (*) 4.22 - 5.81 (MIL/uL)   Hemoglobin 9.3 (*) 13.0 - 17.0 (g/dL)   HCT 16.1 (*)  39.0 - 52.0 (%)   MCV 87.4  78.0 - 100.0 (fL)    MCH 27.9  26.0 - 34.0 (pg)   MCHC 32.0  30.0 - 36.0 (g/dL)   RDW 16.1 (*) 09.6 - 15.5 (%)   Platelets 138 (*) 150 - 400 (K/uL)  DIFFERENTIAL      Component Value Range   Neutrophils Relative 63  43 - 77 (%)   Neutro Abs 2.9  1.7 - 7.7 (K/uL)   Lymphocytes Relative 16  12 - 46 (%)   Lymphs Abs 0.7  0.7 - 4.0 (K/uL)   Monocytes Relative 19 (*) 3 - 12 (%)   Monocytes Absolute 0.9  0.1 - 1.0 (K/uL)   Eosinophils Relative 2  0 - 5 (%)   Eosinophils Absolute 0.1  0.0 - 0.7 (K/uL)   Basophils Relative 0  0 - 1 (%)   Basophils Absolute 0.0  0.0 - 0.1 (K/uL)  COMPREHENSIVE METABOLIC PANEL      Component Value Range   Sodium 141  135 - 145 (mEq/L)   Potassium 3.4 (*) 3.5 - 5.1 (mEq/L)   Chloride 103  96 - 112 (mEq/L)   CO2 28  19 - 32 (mEq/L)   Glucose, Bld 143 (*) 70 - 99 (mg/dL)   BUN 26 (*) 6 - 23 (mg/dL)   Creatinine, Ser 0.45  0.50 - 1.35 (mg/dL)   Calcium 8.5  8.4 - 40.9 (mg/dL)   Total Protein 6.5  6.0 - 8.3 (g/dL)   Albumin 3.1 (*) 3.5 - 5.2 (g/dL)   AST 17  0 - 37 (U/L)   ALT 12  0 - 53 (U/L)   Alkaline Phosphatase 67  39 - 117 (U/L)   Total Bilirubin 0.5  0.3 - 1.2 (mg/dL)   GFR calc non Af Amer 60 (*) >90 (mL/min)   GFR calc Af Amer 70 (*) >90 (mL/min)  GLUCOSE, CAPILLARY      Component Value Range   Glucose-Capillary 120 (*) 70 - 99 (mg/dL)    Ct Head Wo Contrast  06/28/2011  *RADIOLOGY REPORT*  Clinical Data: The patient lost balance and fell.  Unsure of whether he hit head on anything.  CT HEAD WITHOUT CONTRAST  Technique:  Contiguous axial images were obtained from the base of the skull through the vertex without contrast.  Comparison: 03/02/2010  Findings: Diffuse cerebral atrophy.  Ventricular dilatation consistent with central atrophy.  Low attenuation change in the deep white matter consistent small vessel ischemic change.  Old lacunar infarcts in the deep white matter.  No mass effect or midline shift.  No abnormal extra-axial fluid collections.  Wallace Cullens- white matter  junctions are distinct.  Basal cisterns are not effaced.  The no evidence of acute intracranial hemorrhage. Vascular calcifications.  Mucosal thickening in the ethmoid air cells.  No acute air-fluid levels.  Visualized mastoid air cells are not opacified.  No depressed skull fractures.  Stable appearance since previous study.  IMPRESSION: Chronic atrophy and small vessel ischemic changes.  No acute intracranial abnormalities demonstrated.  Original Report Authenticated By: Marlon Pel, M.D.   Ct Cervical Spine Wo Contrast  06/28/2011  *RADIOLOGY REPORT*  Clinical Data: Fall.  CT CERVICAL SPINE WITHOUT CONTRAST  Technique:  Multidetector CT imaging of the cervical spine was performed. Multiplanar CT image reconstructions were also generated.  Comparison: 12/03/2008  Findings: Straightening of the usual cervical lordosis.  Mild retrolisthesis of C3 on C4.  Mild anterolisthesis of C4 on C5, C6 on C7, and C7  on T1.  Changes are likely to be degenerative. Degenerative narrowing of the disc spaces with associated endplate hypertrophic changes.  Degenerative changes in the cervical facet joints.  No vertebral compression deformities.  No prevertebral soft tissue swelling.  Bone cortex and trabecular architecture appear intact.  No focal bone lesion or bone destruction.  No prevertebral soft tissue swelling.  No paraspinal soft tissue infiltration.  The odontoid process appears intact. Vascular calcifications in the cervical carotid arteries.  Stable appearance since previous study.  IMPRESSION: Straightening of the usual cervical lordosis and variable alignment of the cervical vertebra consistent with degenerative change and stable since previous study.  No displaced fractures identified.  Original Report Authenticated By: Marlon Pel, M.D.   Workup is unremarkable. He will likely have problems with recurrent bleeding because of Plavix. Skin tear and partial toenail avulsion the need to be treated with  just dressing changes as needed for her.  1. Fall   2. Hypoglycemia   3. Skin tear of elbow without complication   4. Avulsion of toenail       MDM  Fall with minor injuries of skin tear. He was noted to be hypoglycemic and laboratory workup will be initiated because his only diabetic medication is metformin which usually does not cause significant hypoglycemia. He'll be sent for CT head.     Dione Booze, MD 06/28/11 [phone]

## 2011-06-28 NOTE — ED Notes (Signed)
Patient presents via EMS after a fall in the BR at the senior living facility he resides in.  Stated that he just lost his balance.  Unsure if he hit his head on anything.  Skin tears on both elbows, great toe and 2nd toe on the right foot bleeding.  Dressings removed, areas cleaned and new dressings applied

## 2011-06-28 NOTE — ED Notes (Signed)
[facility] called for transport.  

## 2011-06-28 NOTE — ED Notes (Signed)
Pt lives in a SNF. EMS was called because Pt fell this AM. Pt has multiple  Skin tears on both elbows and on LT toes.

## 2011-06-28 NOTE — ED Notes (Signed)
Pt right big toe bleeding.  Cleaned wound and applied new dressings.

## 2011-06-28 NOTE — ED Notes (Signed)
Pt d/c via [facility] 

## 2011-06-28 NOTE — ED Notes (Signed)
DC iv 

## 2011-07-06 ENCOUNTER — Ambulatory Visit (INDEPENDENT_AMBULATORY_CARE_PROVIDER_SITE_OTHER): Payer: Medicare Other | Admitting: Cardiovascular Disease

## 2011-07-06 ENCOUNTER — Encounter: Payer: Self-pay | Admitting: Cardiovascular Disease

## 2011-07-06 VITALS — BP 140/82 | HR 90 | Ht 67.0 in | Wt 128.0 lb

## 2011-07-06 DIAGNOSIS — I255 Ischemic cardiomyopathy: Secondary | ICD-10-CM

## 2011-07-06 DIAGNOSIS — I2589 Other forms of chronic ischemic heart disease: Secondary | ICD-10-CM

## 2011-07-06 DIAGNOSIS — Z950 Presence of cardiac pacemaker: Secondary | ICD-10-CM

## 2011-07-06 DIAGNOSIS — I4891 Unspecified atrial fibrillation: Secondary | ICD-10-CM

## 2011-07-06 DIAGNOSIS — I2510 Atherosclerotic heart disease of native coronary artery without angina pectoris: Secondary | ICD-10-CM

## 2011-07-06 NOTE — Progress Notes (Signed)
History of Present Illness: 78 yo WM with history of CAD, systolic CHF, atrial fibrillation here for cardiac follow up. He has been followed in the past by Dr. Juanda Chance. He had an anterior MI 2005 treated with a stent to the LAD. He has chronic atrial fibrillation which is being managed with rate control with metoprolol. He had been on coumadin but this was stopped secondary to his fall and bleeding risk. He has a pacemaker. His device has been followed by Dr. Juanda Chance here in the office with remote monitoring by Dr. Ladona Ridgel. He also has a history of systolic CHF with an ejection fraction of 45-50%. In July 2011, he suffered a stroke and was admitted to the hospital. He has severe dementia and resides in an assisted living memory unit facility. His main limitation now is related to memory problems. He was seen by Dr. Johney Frame in may 2012 and his metoprolol was increased.   No SOB, dizziness, near syncope or syncope. No chest pains. Overall doing well. His daughter is with him today and reports that he is doing well.    Primary Care Physician: Dr. Redmond School  Past Medical History  Diagnosis Date  . Cardiac pacemaker in situ     s/p Viatron DDD Pacemaker for sinus node dysfunction  . Orthostatic hypotension   . Hyperlipidemia     mixed  . Atrial fibrillation     not felt to be a coumadin candidate due to falls  . Sinoatrial node dysfunction     s/p PPM (MDT)  . Coronary atherosclerosis of native coronary artery     s/p AWMI 2005 stent LAD  . Diabetes mellitus, type 2   . Chronic renal insufficiency, stage II (mild)   . Intolerance, drug     amniodarone  . Upper GI hemorrhage     2006 Mallory Weiss Tear  . MI (myocardial infarction)     coronary artery status post anterior wall myocardial infarction in 2005 treated with a stent to the left anterior descending.  . Dizziness     recurrent dizziness and falls  . Diabetes mellitus   . Hyperlipidemia   . Renal insufficiency     mild  . Dementia     . Depression   . CHF (congestive heart failure)   . GERD (gastroesophageal reflux disease)     Past Surgical History  Procedure Date  . Pacemaker placement   . Coronary stent placement     Current Outpatient Prescriptions  Medication Sig Dispense Refill  . acetaminophen (TYLENOL) 500 MG tablet Take 1,000 mg by mouth every 6 (six) hours as needed. pain      . bimatoprost (LUMIGAN) 0.03 % ophthalmic drops Place 1 drop into both eyes at bedtime.        . clopidogrel (PLAVIX) 75 MG tablet Take 75 mg by mouth daily.        Marland Kitchen dextromethorphan (DELSYM) 30 MG/5ML liquid Take 30 mg by mouth 2 (two) times daily as needed. For cough      . dextrose (GLUTOSE) 40 % GEL Take 1 Tube by mouth once as needed. For glucose levels < 70      . digoxin (LANOXIN) 0.25 MG tablet Take 250 mcg by mouth daily.      . Eyelid Cleansers (OCUSOFT LID SCRUB EX) Apply 1 each topically 2 (two) times daily. Apply 1 pad to each eye      . ferrous sulfate 325 (65 FE) MG tablet Take 325 mg by mouth daily  with breakfast.        . fludrocortisone (FLORINEF) 0.1 MG tablet Take 0.1 mg by mouth 2 (two) times daily.       . furosemide (LASIX) 20 MG tablet Take 20 mg by mouth daily.      . haloperidol (HALDOL) 0.5 MG tablet Take 0.5 mg by mouth at bedtime.       Marland Kitchen ibuprofen (ADVIL,MOTRIN) 400 MG tablet Take 400 mg by mouth every 6 (six) hours as needed. pain      . loperamide (IMODIUM) 2 MG capsule Take 2 mg by mouth as needed. diarrhea      . LORazepam (ATIVAN) 0.5 MG tablet Take 1 mg by mouth every 8 (eight) hours as needed. anxiety      . magnesium oxide (MAG-OX) 400 MG tablet Take 400 mg by mouth daily. MAR states that patient takes 420mg .      . metFORMIN (GLUCOPHAGE) 500 MG tablet Take 500 mg by mouth 2 (two) times daily with a meal.      . metoprolol (LOPRESSOR) 50 MG tablet Take 1 tablet (50 mg total) by mouth 2 (two) times daily.      Marland Kitchen omeprazole (PRILOSEC) 20 MG capsule Take 20 mg by mouth daily.      Bertram Gala  Glycol-Propyl Glycol (SYSTANE) 0.4-0.3 % SOLN Apply 1 drop to eye 4 (four) times daily.       . polyvinyl alcohol (LIQUIFILM TEARS) 1.4 % ophthalmic solution Place 1 drop into both eyes daily. Every hour while awake      . potassium chloride SA (K-DUR,KLOR-CON) 20 MEQ tablet Take 20 mEq by mouth daily.        . rosuvastatin (CRESTOR) 10 MG tablet Take 5 mg by mouth daily.       Marland Kitchen venlafaxine (EFFEXOR-XR) 150 MG 24 hr capsule Take 150 mg by mouth daily.        Marland Kitchen donepezil (ARICEPT) 5 MG tablet Take 5 mg by mouth at bedtime.         Allergies  Allergen Reactions  . Sulfonamide Derivatives     REACTION: hives  . Amiodarone Hcl Rash    History   Social History  . Marital Status: Widowed    Spouse Name: N/A    Number of Children: N/A  . Years of Education: N/A   Occupational History  . retired     now living in [facility]   Social History Main Topics  . Smoking status: Former Smoker    Quit date: 03/28/1960  . Smokeless tobacco: Not on file  . Alcohol Use: No  . Drug Use: No  . Sexually Active: Not on file   Other Topics Concern  . Not on file   Social History Narrative   Retired[employer] [address] [place].  Accompanied by daughter today.    Family History  Problem Relation Age of Onset  . Coronary artery disease Brother   . Diabetes Mother     Review of Systems:  As stated in the HPI and otherwise negative.   BP 140/82  Pulse 90  Ht 5\' 7"  (1.702 m)  Wt 128 lb (58.06 kg)  BMI 20.05 kg/m2  Physical Examination: General: Thin, cachectic, NAD HEENT: OP clear, mucus membranes moist SKIN: warm, dry. No rashes. Neuro: No focal deficits Musculoskeletal: Muscle strength 5/5 all ext Psychiatric: Flat affect Neck: No JVD, no carotid bruits, no thyromegaly, no lymphadenopathy. Lungs:Clear bilaterally, no wheezes, rhonci, crackles Cardiovascular: Irregular. Systolic murmu. No gallops or  rubs. Abdomen:Soft. Bowel sounds present.  Non-tender.  Extremities: No lower extremity edema. Pulses are 2 + in the bilateral DP/PT.

## 2011-07-06 NOTE — Assessment & Plan Note (Signed)
Rate seems to be controlled on exam. Tolerating beta blocker increase from last year.

## 2011-07-06 NOTE — Patient Instructions (Signed)
Your physician wants you to follow-up in:  12 months.  You will receive a reminder letter in the mail two months in advance. If you don't receive a letter, please call our office to schedule the follow-up appointment.   

## 2011-07-06 NOTE — Assessment & Plan Note (Signed)
Follow up with DR. Allred next month for pacer check.

## 2011-07-06 NOTE — Assessment & Plan Note (Signed)
Volume status is ok. Continue medical therapy.

## 2011-07-06 NOTE — Assessment & Plan Note (Signed)
Stable. No changes in therapy.  

## 2011-08-10 ENCOUNTER — Encounter: Payer: Self-pay | Admitting: Internal Medicine

## 2011-08-10 ENCOUNTER — Ambulatory Visit (INDEPENDENT_AMBULATORY_CARE_PROVIDER_SITE_OTHER): Payer: Medicare Other | Admitting: Internal Medicine

## 2011-08-10 VITALS — BP 142/74 | HR 65 | Ht 68.0 in | Wt 121.0 lb

## 2011-08-10 DIAGNOSIS — I951 Orthostatic hypotension: Secondary | ICD-10-CM

## 2011-08-10 DIAGNOSIS — Z950 Presence of cardiac pacemaker: Secondary | ICD-10-CM

## 2011-08-10 DIAGNOSIS — I4891 Unspecified atrial fibrillation: Secondary | ICD-10-CM

## 2011-08-10 DIAGNOSIS — I495 Sick sinus syndrome: Secondary | ICD-10-CM

## 2011-08-10 LAB — PACEMAKER DEVICE OBSERVATION
BATTERY VOLTAGE: 2.75 V
RV LEAD AMPLITUDE: 8 mv
RV LEAD IMPEDENCE PM: 550 Ohm
RV LEAD THRESHOLD: 0.75 V

## 2011-08-10 NOTE — Assessment & Plan Note (Addendum)
Permanent afib Rates are stable He is not previously felt to be a candidate for coumadin/ xarelto/ pradaxa/ eliquis

## 2011-08-10 NOTE — Assessment & Plan Note (Signed)
Improved I have suggested that we try to wean off of florinef in hopes to also stop lasix His daughter is reluctant to make this change today

## 2011-08-10 NOTE — Assessment & Plan Note (Addendum)
A/Normal pacemaker function See Pace [facility] report No changes today

## 2011-08-10 NOTE — Progress Notes (Signed)
PCP:  Florentina Jenny, MD, MD Primary Cardiologist:  Dr Hardin Negus is a 78 y.o. male with a h/o bradycardia sp PPM (Vitatron)) by Dr Juanda Chance 02/15/05  who presents today to for follow-up in the Electrophysiology device clinic.  The patient is elderly and has dementia.  He resides at [facility].   Unfortunately, he has a h/o falls and significant instability.  He is for this reason felt to be a poor candidate for coumadin.  Today, he  denies symptoms of palpitations, chest pain, shortness of breath, orthopnea, PND, lower extremity edema, dizziness, presyncope, syncope, or neurologic sequela.  The patientis tolerating medications without difficulties and is otherwise without complaint today.   Past Medical History  Diagnosis Date  . Cardiac pacemaker in situ     s/p Viatron DDD Pacemaker for sinus node dysfunction  . Orthostatic hypotension   . Hyperlipidemia     mixed  . Atrial fibrillation     not felt to be a coumadin candidate due to falls  . Sinoatrial node dysfunction     s/p PPM (MDT)  . Coronary atherosclerosis of native coronary artery     s/p AWMI 2005 stent LAD  . Diabetes mellitus, type 2   . Chronic renal insufficiency, stage II (mild)   . Intolerance, drug     amniodarone  . Upper GI hemorrhage     2006 Mallory Weiss Tear  . MI (myocardial infarction)     coronary artery status post anterior wall myocardial infarction in 2005 treated with a stent to the left anterior descending.  . Dizziness     recurrent dizziness and falls  . Diabetes mellitus   . Hyperlipidemia   . Renal insufficiency     mild  . Dementia   . Depression   . CHF (congestive heart failure)   . GERD (gastroesophageal reflux disease)     Past Surgical History  Procedure Date  . Pacemaker placement   . Coronary stent placement     History   Social History  . Marital Status: Widowed    Spouse Name: N/A    Number of Children: N/A  . Years of Education: N/A   Occupational History    . retired     now living in [facility]   Social History Main Topics  . Smoking status: Former Smoker    Quit date: 03/28/1960  . Smokeless tobacco: Not on file  . Alcohol Use: No  . Drug Use: No  . Sexually Active: Not on file   Other Topics Concern  . Not on file   Social History Narrative   Retired[employer] [address] [place].  Accompanied by daughter today.    Family History  Problem Relation Age of Onset  . Coronary artery disease Brother   . Diabetes Mother     Allergies  Allergen Reactions  . Sulfonamide Derivatives     REACTION: hives  . Amiodarone Hcl Rash    Current Outpatient Prescriptions  Medication Sig Dispense Refill  . acetaminophen (TYLENOL) 500 MG tablet Take 1,000 mg by mouth every 6 (six) hours as needed. pain      . bimatoprost (LUMIGAN) 0.03 % ophthalmic drops Place 1 drop into both eyes at bedtime.        . clopidogrel (PLAVIX) 75 MG tablet Take 75 mg by mouth daily.        Marland Kitchen dextromethorphan (DELSYM) 30 MG/5ML liquid Take 30 mg by mouth 2 (two) times daily as needed. For  cough      . dextrose (GLUTOSE) 40 % GEL Take 1 Tube by mouth once as needed. For glucose levels < 70      . digoxin (LANOXIN) 0.25 MG tablet Take 250 mcg by mouth daily.      Marland Kitchen donepezil (ARICEPT) 5 MG tablet Take 5 mg by mouth at bedtime.       . Eyelid Cleansers (OCUSOFT LID SCRUB EX) Apply 1 each topically 2 (two) times daily. Apply 1 pad to each eye      . ferrous sulfate 325 (65 FE) MG tablet Take 325 mg by mouth daily with breakfast.        . fludrocortisone (FLORINEF) 0.1 MG tablet Take 0.1 mg by mouth 2 (two) times daily.       . furosemide (LASIX) 20 MG tablet Take 20 mg by mouth daily.      . haloperidol (HALDOL) 0.5 MG tablet Take 0.5 mg by mouth at bedtime.       Marland Kitchen ibuprofen (ADVIL,MOTRIN) 400 MG tablet Take 400 mg by mouth every 6 (six) hours as needed. pain      . loperamide (IMODIUM) 2 MG capsule Take 2 mg by mouth as needed.  diarrhea      . LORazepam (ATIVAN) 0.5 MG tablet Take 1 mg by mouth every 8 (eight) hours as needed. anxiety      . magnesium oxide (MAG-OX) 400 MG tablet Take 400 mg by mouth daily. MAR states that patient takes 420mg .      . metFORMIN (GLUCOPHAGE) 500 MG tablet Take 500 mg by mouth 2 (two) times daily with a meal.      . metoprolol (LOPRESSOR) 50 MG tablet Take 1 tablet (50 mg total) by mouth 2 (two) times daily.      Marland Kitchen omeprazole (PRILOSEC) 20 MG capsule Take 20 mg by mouth daily.      Bertram Gala Glycol-Propyl Glycol (SYSTANE) 0.4-0.3 % SOLN Apply 1 drop to eye 4 (four) times daily.       . polyvinyl alcohol (LIQUIFILM TEARS) 1.4 % ophthalmic solution Place 1 drop into both eyes daily. Every hour while awake      . potassium chloride SA (K-DUR,KLOR-CON) 20 MEQ tablet Take 20 mEq by mouth daily.        . rosuvastatin (CRESTOR) 10 MG tablet Take 5 mg by mouth daily.       Marland Kitchen venlafaxine (EFFEXOR-XR) 150 MG 24 hr capsule Take 150 mg by mouth daily.          Physical Exam: Filed Vitals:   08/10/11 1023  BP: 142/74  Pulse: 65  Height: 5\' 8"  (1.727 m)  Weight: 121 lb (54.885 kg)    GEN- The patient is elderly, alert today,  Head- normocephalic, atraumatic Eyes-  Sclera clear, conjunctiva pink Ears- hearing intact Oropharynx- clear Neck- supple, no JVP Lymph- no cervical lymphadenopathy Lungs- Clear to ausculation bilaterally, normal work of breathing Chest- pacemaker pocket is well healed Heart- irregular rate and rhythm,  GI- soft, NT, ND, + BS Extremities- no clubbing, cyanosis, trace edema MS- age appropriate muscle atrophy Skin- no rash or lesion Psych- significant dementia.  He is unable to tell me where he lives  Pacemaker interrogation- reviewed in detail today,  See [facility] report  Assessment and Plan:

## 2011-08-10 NOTE — Patient Instructions (Signed)
Your physician wants you to follow-up in: 1 year with Dr Allred.  You will receive a reminder letter in the mail two months in advance. If you don't receive a letter, please call our office to schedule the follow-up appointment.  

## 2011-08-14 ENCOUNTER — Emergency Department (HOSPITAL_COMMUNITY): Payer: MEDICARE

## 2011-08-14 ENCOUNTER — Emergency Department (HOSPITAL_COMMUNITY)
Admission: EM | Admit: 2011-08-14 | Discharge: 2011-08-14 | Disposition: A | Discharge status: Home / Self Care | Payer: MEDICARE | Attending: Emergency Medicine | Admitting: Emergency Medicine

## 2011-08-14 ENCOUNTER — Encounter (HOSPITAL_COMMUNITY): Payer: Self-pay

## 2011-08-14 DIAGNOSIS — Z950 Presence of cardiac pacemaker: Secondary | ICD-10-CM | POA: Insufficient documentation

## 2011-08-14 DIAGNOSIS — I2510 Atherosclerotic heart disease of native coronary artery without angina pectoris: Secondary | ICD-10-CM | POA: Insufficient documentation

## 2011-08-14 DIAGNOSIS — I252 Old myocardial infarction: Secondary | ICD-10-CM | POA: Insufficient documentation

## 2011-08-14 DIAGNOSIS — F3289 Other specified depressive episodes: Secondary | ICD-10-CM | POA: Insufficient documentation

## 2011-08-14 DIAGNOSIS — E785 Hyperlipidemia, unspecified: Secondary | ICD-10-CM | POA: Insufficient documentation

## 2011-08-14 DIAGNOSIS — E119 Type 2 diabetes mellitus without complications: Secondary | ICD-10-CM | POA: Insufficient documentation

## 2011-08-14 DIAGNOSIS — I509 Heart failure, unspecified: Secondary | ICD-10-CM | POA: Insufficient documentation

## 2011-08-14 DIAGNOSIS — R238 Other skin changes: Secondary | ICD-10-CM | POA: Insufficient documentation

## 2011-08-14 DIAGNOSIS — F0390 Unspecified dementia without behavioral disturbance: Secondary | ICD-10-CM | POA: Insufficient documentation

## 2011-08-14 DIAGNOSIS — F329 Major depressive disorder, single episode, unspecified: Secondary | ICD-10-CM | POA: Insufficient documentation

## 2011-08-14 DIAGNOSIS — I4891 Unspecified atrial fibrillation: Secondary | ICD-10-CM | POA: Insufficient documentation

## 2011-08-14 DIAGNOSIS — R209 Unspecified disturbances of skin sensation: Secondary | ICD-10-CM

## 2011-08-14 DIAGNOSIS — Z79899 Other long term (current) drug therapy: Secondary | ICD-10-CM | POA: Insufficient documentation

## 2011-08-14 DIAGNOSIS — K219 Gastro-esophageal reflux disease without esophagitis: Secondary | ICD-10-CM | POA: Insufficient documentation

## 2011-08-14 LAB — URINALYSIS, ROUTINE W REFLEX MICROSCOPIC
Ketones, ur: NEGATIVE mg/dL
Leukocytes, UA: NEGATIVE
Nitrite: NEGATIVE
Protein, ur: 30 mg/dL — AB
Urobilinogen, UA: 1 mg/dL (ref 0.0–1.0)

## 2011-08-14 LAB — BASIC METABOLIC PANEL
Calcium: 9.1 mg/dL (ref 8.4–10.5)
Chloride: 103 mEq/L (ref 96–112)
Creatinine, Ser: 0.98 mg/dL (ref 0.50–1.35)
GFR calc Af Amer: 83 mL/min — ABNORMAL LOW (ref >90)
GFR calc non Af Amer: 72 mL/min — ABNORMAL LOW (ref >90)

## 2011-08-14 LAB — DIFFERENTIAL
Basophils Absolute: 0 10*3/uL (ref 0.0–0.1)
Basophils Relative: 0 % (ref 0–1)
Eosinophils Relative: 2 % (ref 0–5)
Monocytes Absolute: 0.5 10*3/uL (ref 0.1–1.0)
Neutro Abs: 3.9 10*3/uL (ref 1.7–7.7)

## 2011-08-14 LAB — CBC
HCT: 36 % — ABNORMAL LOW (ref 39.0–52.0)
MCHC: 31.7 g/dL (ref 30.0–36.0)
RDW: 16.7 % — ABNORMAL HIGH (ref 11.5–15.5)

## 2011-08-14 LAB — URINE MICROSCOPIC-ADD ON: Urine-Other: NONE SEEN

## 2011-08-14 LAB — GLUCOSE, CAPILLARY

## 2011-08-14 MED ORDER — SODIUM CHLORIDE 0.9 % IV SOLN
INTRAVENOUS | Status: DC
  Administered 2011-08-14: 13:00:00 via INTRAVENOUS

## 2011-08-14 NOTE — ED Provider Notes (Signed)
History     CSN: [id number]  Arrival date & time 08/14/11  9629   First MD Initiated Contact with Patient 08/14/11 [phone]      Chief Complaint  Patient presents with  . Cold Extremity    (Consider location/radiation/quality/duration/timing/severity/associated sxs/prior treatment) The history is provided by the patient, the EMS personnel and the nursing home.   patient is an 78 year old male referred in from a nursing home staff outpatient this morning with cold blue hands bilaterally. No other specific complaints. Patient has a long-standing history of dementia not able to contribute much to the history level V caveat applies.  Past Medical History  Diagnosis Date  . Cardiac pacemaker in situ     s/p Viatron DDD Pacemaker for sinus node dysfunction  . Orthostatic hypotension   . Hyperlipidemia     mixed  . Atrial fibrillation     not felt to be a coumadin candidate due to falls  . Sinoatrial node dysfunction     s/p PPM (MDT)  . Coronary atherosclerosis of native coronary artery     s/p AWMI 2005 stent LAD  . Diabetes mellitus, type 2   . Chronic renal insufficiency, stage II (mild)   . Intolerance, drug     amniodarone  . Upper GI hemorrhage     2006 Mallory Weiss Tear  . MI (myocardial infarction)     coronary artery status post anterior wall myocardial infarction in 2005 treated with a stent to the left anterior descending.  . Dizziness     recurrent dizziness and falls  . Diabetes mellitus   . Hyperlipidemia   . Renal insufficiency     mild  . Dementia   . Depression   . CHF (congestive heart failure)   . GERD (gastroesophageal reflux disease)     Past Surgical History  Procedure Date  . Pacemaker placement   . Coronary stent placement     Family History  Problem Relation Age of Onset  . Coronary artery disease Brother   . Diabetes Mother     History  Substance Use Topics  . Smoking status: Former Smoker    Quit date: 03/28/1960  . Smokeless  tobacco: Not on file  . Alcohol Use: No      Review of Systems  Unable to perform ROS  due to dementia her level V caveat applies.  Allergies  Sulfonamide derivatives and Amiodarone hcl  Home Medications   Current Outpatient Rx  Name Route Sig Dispense Refill  . ACETAMINOPHEN 500 MG PO TABS Oral Take 1,000 mg by mouth every 6 (six) hours as needed. pain    . BIMATOPROST 0.03 % OP SOLN Both Eyes Place 1 drop into both eyes at bedtime.      . CLOPIDOGREL BISULFATE 75 MG PO TABS Oral Take 75 mg by mouth daily.      Marland Kitchen DEXTROMETHORPHAN POLISTIREX ER 30 MG/5ML PO LQCR Oral Take 30 mg by mouth 2 (two) times daily as needed. For cough    . GLUCOSE 40 % PO GEL Oral Take 1 Tube by mouth once as needed. For glucose levels < 70    . DIGOXIN 0.25 MG PO TABS Oral Take 250 mcg by mouth daily.    . DONEPEZIL HCL 5 MG PO TABS Oral Take 5 mg by mouth at bedtime.     . OCUSOFT LID SCRUB EX Apply externally Apply 1 each topically 2 (two) times daily. Apply 1 pad to each eye    . FERROUS  SULFATE 325 (65 FE) MG PO TABS Oral Take 325 mg by mouth daily with breakfast.      . FLUDROCORTISONE ACETATE 0.1 MG PO TABS Oral Take 0.1 mg by mouth 2 (two) times daily.     . FUROSEMIDE 20 MG PO TABS Oral Take 20 mg by mouth daily.    Marland Kitchen HALOPERIDOL 0.5 MG PO TABS Oral Take 0.5 mg by mouth at bedtime.     . IBUPROFEN 400 MG PO TABS Oral Take 400 mg by mouth every 6 (six) hours as needed. pain    . LOPERAMIDE HCL 2 MG PO CAPS Oral Take 2 mg by mouth as needed. diarrhea    . LORAZEPAM 0.5 MG PO TABS Oral Take 1 mg by mouth every 8 (eight) hours as needed. anxiety    . MAGNESIUM OXIDE 400 MG PO TABS Oral Take 400 mg by mouth daily. MAR states that patient takes 420mg .    . METFORMIN HCL 500 MG PO TABS Oral Take 500 mg by mouth 2 (two) times daily with a meal.    . METOPROLOL TARTRATE 50 MG PO TABS Oral Take 1 tablet (50 mg total) by mouth 2 (two) times daily.    Marland Kitchen OMEPRAZOLE 20 MG PO CPDR Oral Take 20 mg by mouth daily.     Marland Kitchen POLYETHYL GLYCOL-PROPYL GLYCOL 0.4-0.3 % OP SOLN Ophthalmic Apply 1 drop to eye 4 (four) times daily.     Marland Kitchen POLYVINYL ALCOHOL 1.4 % OP SOLN Both Eyes Place 1 drop into both eyes daily. Every hour while awake    . POTASSIUM CHLORIDE CRYS ER 20 MEQ PO TBCR Oral Take 20 mEq by mouth daily.      Marland Kitchen ROSUVASTATIN CALCIUM 10 MG PO TABS Oral Take 5 mg by mouth daily.     . VENLAFAXINE HCL ER 150 MG PO CP24 Oral Take 150 mg by mouth daily.        BP 151/86  Pulse 71  Temp(Src) 97.4 F (36.3 C) (Oral)  Resp 23  SpO2 99%  Physical Exam  Nursing note and vitals reviewed. Constitutional: He appears well-developed and well-nourished. No distress.  HENT:  Head: Normocephalic and atraumatic.  Eyes: Conjunctivae and EOM are normal. Pupils are equal, round, and reactive to light.  Neck: Normal range of motion. Neck supple.  Cardiovascular: Normal rate, regular rhythm, normal heart sounds and intact distal pulses.   No murmur heard. Pulmonary/Chest: Effort normal and breath sounds normal. No respiratory distress.  Abdominal: Soft. Bowel sounds are normal. There is no tenderness.  Musculoskeletal: Normal range of motion. He exhibits no tenderness.       Bilat forearms and hands with bruising old blood under the skin, fingers blue with sluggish cap refill, cool, but radial pulses 2 plus bilaterally. Distal tips of fingers with small darker blue purple areas not black. 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   Neurological: He is alert. No cranial nerve deficit. He exhibits normal muscle tone. Coordination normal.  Skin: Skin is warm. No rash noted. He is not diaphoretic. No erythema.    ED Course  Procedures (including critical care time)  Labs Reviewed  CBC - Abnormal; Notable for the following:    RBC 4.18 (*)    Hemoglobin 11.4 (*)    HCT 36.0 (*)    RDW 16.7 (*)    All other components within normal limits  BASIC METABOLIC PANEL - Abnormal; Notable for the  following:    Glucose, Bld  109 (*)    BUN 24 (*)    GFR calc non Af Amer 72 (*)    GFR calc Af Amer 83 (*)    All other components within normal limits  URINALYSIS, ROUTINE W REFLEX MICROSCOPIC - Abnormal; Notable for the following:    Protein, ur 30 (*)    All other components within normal limits  GLUCOSE, CAPILLARY  DIFFERENTIAL  URINE MICROSCOPIC-ADD ON   Dg Chest 2 View  08/14/2011  *RADIOLOGY REPORT*  Clinical Data: Diabetic, cold extremity (fingers)  CHEST - 2 VIEW  Comparison: 06/27/2011; 03/02/2010  Findings:  Grossly unchanged cardiac silhouette and mediastinal contours with mild ectasia of the thoracic aorta.  Atherosclerotic calcifications within the aortic arch.  Stable positioning of support apparatus. The lungs remain hyperinflated with flattening of the bilateral hemidiaphragms.  No focal parenchymal opacities.  No pleural effusion or pneumothorax.  Grossly unchanged bones.  IMPRESSION: Hyperexpanded lungs without acute cardiopulmonary disease.  Original Report Authenticated By: Waynard Reeds, M.D.    Date: 08/14/2011  Rate: 87  Rhythm: atrial fibrillation  QRS Axis: normal  Intervals: normal  ST/T Wave abnormalities: nonspecific ST/T changes  Conduction Disutrbances:left anterior fascicular block  Narrative Interpretation:   Old EKG Reviewed: unchanged No sniffing change in EKG from 06/27/2011 occasional PVC noted.  Results for orders placed during the hospital encounter of 08/14/11  GLUCOSE, CAPILLARY      Component Value Range   Glucose-Capillary 88  70 - 99 (mg/dL)   Comment 1 Documented in Chart     Comment 2 Notify RN    CBC      Component Value Range   WBC 5.5  4.0 - 10.5 (K/uL)   RBC 4.18 (*) 4.22 - 5.81 (MIL/uL)   Hemoglobin 11.4 (*) 13.0 - 17.0 (g/dL)   HCT 16.1 (*) 09.6 - 52.0 (%)   MCV 86.1  78.0 - 100.0 (fL)   MCH 27.3  26.0 - 34.0 (pg)   MCHC 31.7  30.0 - 36.0 (g/dL)   RDW 04.5 (*) 40.9 - 15.5 (%)   Platelets 154  150 - 400 (K/uL)    DIFFERENTIAL      Component Value Range   Neutrophils Relative 71  43 - 77 (%)   Neutro Abs 3.9  1.7 - 7.7 (K/uL)   Lymphocytes Relative 18  12 - 46 (%)   Lymphs Abs 1.0  0.7 - 4.0 (K/uL)   Monocytes Relative 9  3 - 12 (%)   Monocytes Absolute 0.5  0.1 - 1.0 (K/uL)   Eosinophils Relative 2  0 - 5 (%)   Eosinophils Absolute 0.1  0.0 - 0.7 (K/uL)   Basophils Relative 0  0 - 1 (%)   Basophils Absolute 0.0  0.0 - 0.1 (K/uL)  BASIC METABOLIC PANEL      Component Value Range   Sodium 140  135 - 145 (mEq/L)   Potassium 3.9  3.5 - 5.1 (mEq/L)   Chloride 103  96 - 112 (mEq/L)   CO2 27  19 - 32 (mEq/L)   Glucose, Bld 109 (*) 70 - 99 (mg/dL)   BUN 24 (*) 6 - 23 (mg/dL)   Creatinine, Ser 8.11  0.50 - 1.35 (mg/dL)   Calcium 9.1  8.4 - 91.4 (mg/dL)   GFR calc non Af Amer 72 (*) >90 (mL/min)   GFR calc Af Amer 83 (*) >90 (mL/min)  URINALYSIS, ROUTINE W REFLEX MICROSCOPIC      Component Value Range   Color, Urine  YELLOW  YELLOW    APPearance CLEAR  CLEAR    Specific Gravity, Urine 1.013  1.005 - 1.030    pH 8.0  5.0 - 8.0    Glucose, UA NEGATIVE  NEGATIVE (mg/dL)   Hgb urine dipstick NEGATIVE  NEGATIVE    Bilirubin Urine NEGATIVE  NEGATIVE    Ketones, ur NEGATIVE  NEGATIVE (mg/dL)   Protein, ur 30 (*) NEGATIVE (mg/dL)   Urobilinogen, UA 1.0  0.0 - 1.0 (mg/dL)   Nitrite NEGATIVE  NEGATIVE    Leukocytes, UA NEGATIVE  NEGATIVE   URINE MICROSCOPIC-ADD ON      Component Value Range   Urine-Other       Value: NO FORMED ELEMENTS SEEN ON URINE MICROSCOPIC EXAMINATION     1. Cold hands       MDM   In ED with warming of hands coolness and blueness resolved except for a few very distal finger tip dark blue demarcation which could be some micro infarcts. The initial bluing, now resolved, like a Reynauld's like vasospasm, but no apparent pain. Daughter arrived and preferred her father be returned to SNF. Clinically not sure what hospitalization would accomplish, nursing home staff and  provider to monitor finger tips and if blacken or become necrotic will need to return to ED. Patients blood flow in at radial pulse is 2 plus, very good, so if infarcting would be microvascular. Also did consider embolic process, but no heart murmur. Patient is a DNR and daughter appropriately wants no aggressive investigation or work up.         Shelda Jakes, MD 08/15/11 1352

## 2011-08-14 NOTE — ED Notes (Signed)
Pt denies any pain, pt is unsure as to why he is here, pt has a hx of Dementia, currently reside at a memory care NH, pt's only complaint is bilateral cold hands.

## 2011-08-14 NOTE — ED Notes (Signed)
Patient brought in via [facility] from a Nursing home. [name] [name]) Staf found patient this morning with cold/blue hands and arms bilaterally.

## 2011-08-14 NOTE — ED Notes (Signed)
Daughter, Dois Davenport at bedside reports the last time his fingers turned blue and felt cold his CBG had decreased to 30, this RN informed Dois Davenport pt's CBG was 88 and is now 61

## 2011-08-14 NOTE — Discharge Instructions (Signed)
In the emergency department the hands pinked up with warming however the distal fingertips do have a little bit of purple area that could represent some microinfarcts. This will need to be followed carefully. Patient has excellent radial pulses in both hands and also has good range of motion of the fingers. Appears also to be in no pain. Recommend that Dr. Redmond School take a look at the patient in the next 2 days patient should return for any blackening of the fingers for new worse symptoms. Lab workup today without any significant findings.

## 2011-08-14 NOTE — ED Notes (Signed)
Patient denies pain and is resting comfortably.  

## 2011-08-14 NOTE — ED Notes (Addendum)
Family at bedside. Daughter updated on pt's condition

## 2011-08-14 NOTE — ED Notes (Signed)
Patient transported to X-ray 

## 2011-09-18 ENCOUNTER — Encounter (HOSPITAL_COMMUNITY): Payer: Self-pay | Admitting: Emergency Medicine

## 2011-09-18 ENCOUNTER — Emergency Department (HOSPITAL_COMMUNITY)
Admission: EM | Admit: 2011-09-18 | Discharge: 2011-09-18 | Disposition: A | Discharge status: Skilled Nursing Facility | Payer: MEDICARE | Attending: Emergency Medicine | Admitting: Emergency Medicine

## 2011-09-18 ENCOUNTER — Emergency Department (HOSPITAL_COMMUNITY): Payer: MEDICARE

## 2011-09-18 DIAGNOSIS — R55 Syncope and collapse: Secondary | ICD-10-CM | POA: Insufficient documentation

## 2011-09-18 DIAGNOSIS — Z7901 Long term (current) use of anticoagulants: Secondary | ICD-10-CM | POA: Insufficient documentation

## 2011-09-18 DIAGNOSIS — W19XXXA Unspecified fall, initial encounter: Secondary | ICD-10-CM | POA: Insufficient documentation

## 2011-09-18 DIAGNOSIS — E785 Hyperlipidemia, unspecified: Secondary | ICD-10-CM | POA: Insufficient documentation

## 2011-09-18 DIAGNOSIS — I2510 Atherosclerotic heart disease of native coronary artery without angina pectoris: Secondary | ICD-10-CM | POA: Insufficient documentation

## 2011-09-18 DIAGNOSIS — E119 Type 2 diabetes mellitus without complications: Secondary | ICD-10-CM | POA: Insufficient documentation

## 2011-09-18 DIAGNOSIS — S51019A Laceration without foreign body of unspecified elbow, initial encounter: Secondary | ICD-10-CM

## 2011-09-18 DIAGNOSIS — N182 Chronic kidney disease, stage 2 (mild): Secondary | ICD-10-CM | POA: Insufficient documentation

## 2011-09-18 DIAGNOSIS — Y9210 Unspecified residential institution as the place of occurrence of the external cause: Secondary | ICD-10-CM | POA: Insufficient documentation

## 2011-09-18 DIAGNOSIS — I252 Old myocardial infarction: Secondary | ICD-10-CM | POA: Insufficient documentation

## 2011-09-18 DIAGNOSIS — S51009A Unspecified open wound of unspecified elbow, initial encounter: Secondary | ICD-10-CM | POA: Insufficient documentation

## 2011-09-18 DIAGNOSIS — I509 Heart failure, unspecified: Secondary | ICD-10-CM | POA: Insufficient documentation

## 2011-09-18 DIAGNOSIS — Z79899 Other long term (current) drug therapy: Secondary | ICD-10-CM | POA: Insufficient documentation

## 2011-09-18 LAB — POCT I-STAT, CHEM 8
BUN: 27 mg/dL — ABNORMAL HIGH (ref 6–23)
Hemoglobin: 11.6 g/dL — ABNORMAL LOW (ref 13.0–17.0)
Sodium: 144 mEq/L (ref 135–145)
TCO2: 28 mmol/L (ref 0–100)

## 2011-09-18 NOTE — ED Notes (Addendum)
Came into the room and found pt standing by the bed. Helped him back into the bed and reoriented him to his environment. Pt was calm and cooperative.

## 2011-09-18 NOTE — ED Notes (Signed)
Pt from [facility].  Pt had unwitnessed fall.  Fell on R side.  Pt denies hitting head.  Nursing home sent for eval.  1.5" skin tear to R arm noted by EMS

## 2011-09-18 NOTE — ED Notes (Signed)
[facility] called for transportation back to [facility] 

## 2011-09-18 NOTE — ED Notes (Addendum)
Dr. Patria Mane notified about elevated BP.  He approved continuing with the pt transfer.

## 2011-09-18 NOTE — ED Notes (Addendum)
Received report from previous RN. Pt is resting and A&O to person only. Pt is in afib with intermittent RVR on the monitor. Will continue to monitor.

## 2011-09-18 NOTE — ED Provider Notes (Addendum)
History     CSN: [id number]  Arrival date & time 09/18/11  1825   First MD Initiated Contact with Patient 09/18/11 1948      Chief Complaint  Patient presents with  . Fall    (Consider location/radiation/quality/duration/timing/severity/associated sxs/prior treatment) The history is provided by the EMS personnel and the nursing home. The history is limited by the condition of the patient (Level 5 caveat: Dementia).   Patient suffered a fall sometime today at his nursing home.  He denies pain, dizziness, chest pain, shortness of breath, weakness, and numbness.  Past Medical History  Diagnosis Date  . Cardiac pacemaker in situ     s/p Viatron DDD Pacemaker for sinus node dysfunction  . Orthostatic hypotension   . Hyperlipidemia     mixed  . Atrial fibrillation     not felt to be a coumadin candidate due to falls  . Sinoatrial node dysfunction     s/p PPM (MDT)  . Coronary atherosclerosis of native coronary artery     s/p AWMI 2005 stent LAD  . Diabetes mellitus, type 2   . Chronic renal insufficiency, stage II (mild)   . Intolerance, drug     amniodarone  . Upper GI hemorrhage     2006 Mallory Weiss Tear  . MI (myocardial infarction)     coronary artery status post anterior wall myocardial infarction in 2005 treated with a stent to the left anterior descending.  . Dizziness     recurrent dizziness and falls  . Diabetes mellitus   . Hyperlipidemia   . Renal insufficiency     mild  . Dementia   . Depression   . CHF (congestive heart failure)   . GERD (gastroesophageal reflux disease)     Past Surgical History  Procedure Date  . Pacemaker placement   . Coronary stent placement     Family History  Problem Relation Age of Onset  . Coronary artery disease Brother   . Diabetes Mother     History  Substance Use Topics  . Smoking status: Former Smoker    Quit date: 03/28/1960  . Smokeless tobacco: Not on file  . Alcohol Use: No      Review of Systems    Unable to perform ROS   Allergies  Sulfonamide derivatives and Amiodarone hcl  Home Medications   Current Outpatient Rx  Name Route Sig Dispense Refill  . ACETAMINOPHEN 500 MG PO TABS Oral Take 1,000 mg by mouth every 6 (six) hours as needed. pain    . BIMATOPROST 0.03 % OP SOLN Both Eyes Place 1 drop into both eyes at bedtime.      . CLOPIDOGREL BISULFATE 75 MG PO TABS Oral Take 75 mg by mouth daily.      Marland Kitchen DEXTROMETHORPHAN POLISTIREX ER 30 MG/5ML PO LQCR Oral Take 30 mg by mouth 2 (two) times daily as needed. For cough    . GLUCOSE 40 % PO GEL Oral Take 1 Tube by mouth once as needed. For glucose levels < 70    . DIGOXIN 0.25 MG PO TABS Oral Take 250 mcg by mouth daily.    . DONEPEZIL HCL 5 MG PO TABS Oral Take 5 mg by mouth at bedtime.     . OCUSOFT LID SCRUB EX Apply externally Apply 1 each topically 2 (two) times daily. Apply 1 pad to each eye    . FERROUS SULFATE 325 (65 FE) MG PO TABS Oral Take 325 mg by mouth daily with breakfast.      .  FUROSEMIDE 20 MG PO TABS Oral Take 20 mg by mouth daily.    Marland Kitchen HALOPERIDOL 0.5 MG PO TABS Oral Take 0.5 mg by mouth at bedtime.     . IBUPROFEN 400 MG PO TABS Oral Take 400 mg by mouth every 6 (six) hours as needed. pain    . LORAZEPAM 0.5 MG PO TABS Oral Take 1 mg by mouth every 8 (eight) hours as needed. anxiety    . MAGNESIUM OXIDE 400 MG PO TABS Oral Take 400 mg by mouth daily. MAR states that patient takes 420mg .    . METFORMIN HCL 500 MG PO TABS Oral Take 500 mg by mouth 2 (two) times daily with a meal.    . METOPROLOL TARTRATE 50 MG PO TABS Oral Take 1 tablet (50 mg total) by mouth 2 (two) times daily.    . NEOMYCIN-BACITRACIN ZN-POLYMYX 5-400-10000 OP OINT Both Eyes Place 1 application into both eyes 4 (four) times daily. One drop in each eye four times a day for 7 days    . OMEPRAZOLE 20 MG PO CPDR Oral Take 20 mg by mouth daily.    Marland Kitchen POLYETHYL GLYCOL-PROPYL GLYCOL 0.4-0.3 % OP SOLN Ophthalmic Apply 1 drop to eye 4 (four) times daily.      Marland Kitchen POLYVINYL ALCOHOL 1.4 % OP SOLN Both Eyes Place 1 drop into both eyes daily. Every hour while awake    . POTASSIUM CHLORIDE CRYS ER 20 MEQ PO TBCR Oral Take 20 mEq by mouth daily.      Marland Kitchen ROSUVASTATIN CALCIUM 10 MG PO TABS Oral Take 5 mg by mouth daily.     . VENLAFAXINE HCL ER 150 MG PO CP24 Oral Take 150 mg by mouth daily.      Marland Kitchen FLUDROCORTISONE ACETATE 0.1 MG PO TABS Oral Take 0.1 mg by mouth 2 (two) times daily.       BP 128/88  Pulse 66  Temp 98.4 F (36.9 C) (Oral)  Resp 23  SpO2 97%  Physical Exam  Nursing note and vitals reviewed. Constitutional: He is oriented to person, place, and time. He appears well-developed and well-nourished.  HENT:  Head: Normocephalic and atraumatic.  Eyes: EOM are normal. Pupils are equal, round, and reactive to light.  Neck: Normal range of motion. Neck supple.       nml ROM  Cardiovascular: Normal heart sounds and intact distal pulses.  An irregularly irregular rhythm present. Tachycardia present.   Pulmonary/Chest: Effort normal and breath sounds normal. No respiratory distress.  Abdominal: Soft. Bowel sounds are normal. He exhibits no distension. There is no tenderness.  Musculoskeletal: Normal range of motion. He exhibits no edema and no tenderness.       Small skin tear to right lateral proximal forearm without active bleeding.  Neurological: He is alert and oriented to person, place, and time. No cranial nerve deficit.       Oriented to person and place, not to time  Skin: Skin is warm and dry.     Psychiatric: He has a normal mood and affect. Judgment normal. Cognition and memory are impaired.    ED Course  Procedures    Date: 11/07/2011  Rate: 97  Rhythm: atrial flutter relation with controlled rate  QRS Axis: normal  Intervals: normal  ST/T Wave abnormalities: inferior lateral T wave inversions  Conduction Disutrbances: none  Narrative Interpretation:   Old EKG Reviewed: No significant changes noted     Labs Reviewed   POCT I-STAT, CHEM 8 - Abnormal; Notable  for the following:    BUN 27 (*)     Creatinine, Ser 1.50 (*)     Glucose, Bld 186 (*)     Hemoglobin 11.6 (*)     HCT 34.0 (*)     All other components within normal limits   Ct Head Wo Contrast  09/18/2011  *RADIOLOGY REPORT*  Clinical Data: 78 year old male with syncope, fall, headache, on Coumadin.  CT HEAD WITHOUT CONTRAST  Technique:  Contiguous axial images were obtained from the base of the skull through the vertex without contrast.  Comparison: 06/28/2011 and earlier.  Findings: Visualized paranasal sinuses and mastoids are clear.  No acute orbit soft tissue findings.  No scalp hematoma identified. No acute osseous abnormality identified.  Calcified atherosclerosis at the skull base.  Stable cerebral volume.  No ventriculomegaly. No midline shift, mass effect, or evidence of mass lesion.  Stable gray-white matter differentiation including patchy white matter hypodensity and chronic left corona radiata/basal ganglia lacunar infarct. No acute intracranial hemorrhage identified.  No evidence of cortically based acute infarction identified.  No suspicious intracranial vascular hyperdensity.  IMPRESSION: No acute traumatic injury.  Stable noncontrast CT appearance of the brain.  Original Report Authenticated By: Harley Hallmark, M.D.     1. Fall   2. Skin tear of elbow without complication       MDM  The patient is well-appearing CT head is normal.  His normal range of motion of his neck.  No weakness of his upper lower extremities.  He is a very small skin tear on his right proximal forearm that we treated with bandages.  He does have mild renal insufficiency on his labs today.  This will need to be followed up closely by his physician        Lyanne Co, MD 09/18/11 2209  Lyanne Co, MD 11/07/11 [phone]

## 2011-10-24 ENCOUNTER — Encounter (HOSPITAL_COMMUNITY): Payer: Self-pay

## 2011-10-24 ENCOUNTER — Emergency Department (HOSPITAL_COMMUNITY): Payer: MEDICARE

## 2011-10-24 ENCOUNTER — Emergency Department (HOSPITAL_COMMUNITY)
Admission: EM | Admit: 2011-10-24 | Discharge: 2011-10-24 | Disposition: A | Discharge status: Home / Self Care | Payer: MEDICARE | Attending: Emergency Medicine | Admitting: Emergency Medicine

## 2011-10-24 DIAGNOSIS — Z87891 Personal history of nicotine dependence: Secondary | ICD-10-CM | POA: Insufficient documentation

## 2011-10-24 DIAGNOSIS — E785 Hyperlipidemia, unspecified: Secondary | ICD-10-CM | POA: Insufficient documentation

## 2011-10-24 DIAGNOSIS — E119 Type 2 diabetes mellitus without complications: Secondary | ICD-10-CM | POA: Insufficient documentation

## 2011-10-24 DIAGNOSIS — N182 Chronic kidney disease, stage 2 (mild): Secondary | ICD-10-CM | POA: Insufficient documentation

## 2011-10-24 DIAGNOSIS — I4891 Unspecified atrial fibrillation: Secondary | ICD-10-CM | POA: Insufficient documentation

## 2011-10-24 DIAGNOSIS — I509 Heart failure, unspecified: Secondary | ICD-10-CM | POA: Insufficient documentation

## 2011-10-24 DIAGNOSIS — Y9210 Unspecified residential institution as the place of occurrence of the external cause: Secondary | ICD-10-CM | POA: Insufficient documentation

## 2011-10-24 DIAGNOSIS — Z79899 Other long term (current) drug therapy: Secondary | ICD-10-CM | POA: Insufficient documentation

## 2011-10-24 DIAGNOSIS — S7000XA Contusion of unspecified hip, initial encounter: Secondary | ICD-10-CM | POA: Insufficient documentation

## 2011-10-24 DIAGNOSIS — I252 Old myocardial infarction: Secondary | ICD-10-CM | POA: Insufficient documentation

## 2011-10-24 DIAGNOSIS — W19XXXA Unspecified fall, initial encounter: Secondary | ICD-10-CM | POA: Insufficient documentation

## 2011-10-24 DIAGNOSIS — F0390 Unspecified dementia without behavioral disturbance: Secondary | ICD-10-CM | POA: Insufficient documentation

## 2011-10-24 NOTE — ED Notes (Signed)
[facility] called  

## 2011-10-24 NOTE — ED Notes (Signed)
Pt wanted to urinate, pt ambulatory, and ambulated to restroom with stand by assist.

## 2011-10-24 NOTE — ED Provider Notes (Addendum)
History     CSN: [id number]  Arrival date & time 10/24/11  0911   First MD Initiated Contact with Patient 10/24/11 [phone]      Chief Complaint  Patient presents with  . Fall    H[facility], fell 2 weeks ago from standing position at the facility, didn't tell the staff until today, admit to hitting his head, no LOC, pain at left hip, pain worsen with movement, large bruise noted on left hip. ABC intact.  Past Medical History  Diagnosis Date  . Cardiac pacemaker in situ     s/p Viatron DDD Pacemaker for sinus node dysfunction  . Orthostatic hypotension   . Hyperlipidemia     mixed  . Atrial fibrillation     not felt to be a coumadin candidate due to falls  . Sinoatrial node dysfunction     s/p PPM (MDT)  . Coronary atherosclerosis of native coronary artery     s/p AWMI 2005 stent LAD  . Diabetes mellitus, type 2   . Chronic renal insufficiency, stage II (mild)   . Intolerance, drug     amniodarone  . Upper GI hemorrhage     2006 Mallory Weiss Tear  . MI (myocardial infarction)     coronary artery status post anterior wall myocardial infarction in 2005 treated with a stent to the left anterior descending.  . Dizziness     recurrent dizziness and falls  . Diabetes mellitus   . Hyperlipidemia   . Renal insufficiency     mild  . Dementia   . Depression   . CHF (congestive heart failure)   . GERD (gastroesophageal reflux disease)     Past Surgical History  Procedure Date  . Pacemaker placement   . Coronary stent placement     Family History  Problem Relation Age of Onset  . Coronary artery disease Brother   . Diabetes Mother     History  Substance Use Topics  . Smoking status: Former Smoker    Quit date: 03/28/1960  . Smokeless tobacco: Not on file  . Alcohol Use: No      Review of Systems  All other systems reviewed and are negative.    Allergies  Sulfonamide derivatives and Amiodarone hcl  Home Medications   Current  Outpatient Rx  Name Route Sig Dispense Refill  . ACETAMINOPHEN 500 MG PO TABS Oral Take 1,000 mg by mouth every 6 (six) hours as needed. pain    . ATORVASTATIN CALCIUM 10 MG PO TABS Oral Take 10 mg by mouth daily.    Marland Kitchen VITAMIN D 1000 UNITS PO TABS Oral Take 1,000 Units by mouth daily.    Marland Kitchen CLOPIDOGREL BISULFATE 75 MG PO TABS Oral Take 75 mg by mouth daily.      Marland Kitchen DEXTROMETHORPHAN POLISTIREX ER 30 MG/5ML PO LQCR Oral Take 30 mg by mouth 2 (two) times daily as needed. For cough    . GLUCOSE 40 % PO GEL Oral Take 1 Tube by mouth once as needed. For glucose levels < 70    . DIGOXIN 0.25 MG PO TABS Oral Take 250 mcg by mouth daily.    . DONEPEZIL HCL 5 MG PO TABS Oral Take 5 mg by mouth at bedtime.     . OCUSOFT LID SCRUB EX Apply externally Apply 1 each topically 2 (two) times daily. Apply 1 pad to each eye    . FERROUS SULFATE 325 (65 FE) MG PO TABS Oral Take 325  mg by mouth daily with breakfast.      . FLUDROCORTISONE ACETATE 0.1 MG PO TABS Oral Take 0.1 mg by mouth 2 (two) times daily.     . FUROSEMIDE 20 MG PO TABS Oral Take 20 mg by mouth daily.    Marland Kitchen HALOPERIDOL 0.5 MG PO TABS Oral Take 0.5 mg by mouth at bedtime.     . IBUPROFEN 400 MG PO TABS Oral Take 400 mg by mouth every 6 (six) hours as needed. pain    . LATANOPROST 0.005 % OP SOLN Both Eyes Place 1 drop into both eyes at bedtime.    Marland Kitchen LORAZEPAM 0.5 MG PO TABS Oral Take 1 mg by mouth every 8 (eight) hours as needed. anxiety    . MAGNESIUM OXIDE 400 MG PO TABS Oral Take 400 mg by mouth daily. MAR states that patient takes 420mg .    . METFORMIN HCL 500 MG PO TABS Oral Take 500 mg by mouth 2 (two) times daily with a meal.    . METOPROLOL TARTRATE 50 MG PO TABS Oral Take 1 tablet (50 mg total) by mouth 2 (two) times daily.    . NEOMYCIN-BACITRACIN ZN-POLYMYX 5-400-10000 OP OINT Both Eyes Place 1 application into both eyes 4 (four) times daily. One drop in each eye four times a day for 7 days    . OMEPRAZOLE 40 MG PO CPDR Oral Take 40 mg by  mouth daily.    Marland Kitchen POLYETHYL GLYCOL-PROPYL GLYCOL 0.4-0.3 % OP SOLN Ophthalmic Apply 1 drop to eye 4 (four) times daily.     Marland Kitchen POLYVINYL ALCOHOL 1.4 % OP SOLN Both Eyes Place 1 drop into both eyes daily. Every hour while awake    . POTASSIUM CHLORIDE CRYS ER 20 MEQ PO TBCR Oral Take 20 mEq by mouth daily.      Marland Kitchen ROSUVASTATIN CALCIUM 10 MG PO TABS Oral Take 5 mg by mouth daily.     . VENLAFAXINE HCL ER 150 MG PO CP24 Oral Take 150 mg by mouth daily.        BP 139/69  Pulse 74  Temp 97.7 F (36.5 C) (Oral)  Resp 20  SpO2 98%  Physical Exam  Nursing note and vitals reviewed. Constitutional: He is oriented to person, place, and time. He appears well-developed. No distress.  HENT:  Head: Normocephalic and atraumatic.  Eyes: Pupils are equal, round, and reactive to light.  Neck: Normal range of motion.  Cardiovascular: Normal rate and intact distal pulses.   Pulmonary/Chest: No respiratory distress.  Abdominal: Normal appearance. He exhibits no distension.  Musculoskeletal: Normal range of motion.       Left hip: He exhibits tenderness. He exhibits no deformity.  Neurological: He is alert and oriented to person, place, and time. No cranial nerve deficit.  Skin: Skin is warm and dry. No rash noted.  Psychiatric: He has a normal mood and affect. His behavior is normal.    ED Course  Procedures (including critical care time)  Labs Reviewed - No data to display Dg Hip Complete Left  10/24/2011  *RADIOLOGY REPORT*  Clinical Data: Fall, posterior left hip pain.  LEFT HIP - COMPLETE 2+ VIEW  Comparison: None.  Findings: Mild symmetric degenerative changes in the hips bilaterally.  SI joints are symmetric and unremarkable. No acute bony abnormality.  Specifically, no fracture, subluxation, or dislocation.  Soft tissues are intact.  IMPRESSION: Mild degenerative changes in the hips bilaterally.  No acute bony abnormality.  Original Report Authenticated By: Aubery Lapping  DOVER, M.D.     1.  Contusion, hip   2. Fall       MDM   Patient abulated in the emergency department with no difficulty.       Nelia Shi, MD 10/24/11 1147  Nelia Shi, MD 10/24/11 1153

## 2011-10-24 NOTE — ED Notes (Signed)
Pt able to ambulated well without assistance

## 2011-10-24 NOTE — ED Notes (Signed)
[facility], fell 2 weeks ago from standing position at the facility, didn't tell the staff until today, admit to hitting his head, no LOC, pain at left hip, pain worsen with movement, large bruise noted on left hip. ABC intact.

## 2011-10-24 NOTE — ED Notes (Signed)
RN acknowledge vitals 

## 2011-10-24 NOTE — ED Notes (Signed)
Report given to [facility]

## 2012-03-01 ENCOUNTER — Emergency Department (HOSPITAL_COMMUNITY)
Admission: EM | Admit: 2012-03-01 | Discharge: 2012-03-01 | Disposition: A | Discharge status: Home / Self Care | Payer: MEDICARE | Attending: Emergency Medicine | Admitting: Emergency Medicine

## 2012-03-01 ENCOUNTER — Encounter (HOSPITAL_COMMUNITY): Payer: Self-pay | Admitting: *Deleted

## 2012-03-01 ENCOUNTER — Emergency Department (HOSPITAL_COMMUNITY): Payer: MEDICARE

## 2012-03-01 ENCOUNTER — Other Ambulatory Visit: Payer: Self-pay

## 2012-03-01 ENCOUNTER — Encounter (HOSPITAL_COMMUNITY): Payer: Self-pay | Admitting: Emergency Medicine

## 2012-03-01 DIAGNOSIS — S0993XA Unspecified injury of face, initial encounter: Secondary | ICD-10-CM | POA: Insufficient documentation

## 2012-03-01 DIAGNOSIS — Z7901 Long term (current) use of anticoagulants: Secondary | ICD-10-CM | POA: Insufficient documentation

## 2012-03-01 DIAGNOSIS — I495 Sick sinus syndrome: Secondary | ICD-10-CM | POA: Insufficient documentation

## 2012-03-01 DIAGNOSIS — Z87891 Personal history of nicotine dependence: Secondary | ICD-10-CM | POA: Insufficient documentation

## 2012-03-01 DIAGNOSIS — F329 Major depressive disorder, single episode, unspecified: Secondary | ICD-10-CM | POA: Insufficient documentation

## 2012-03-01 DIAGNOSIS — F0390 Unspecified dementia without behavioral disturbance: Secondary | ICD-10-CM | POA: Insufficient documentation

## 2012-03-01 DIAGNOSIS — Y9210 Unspecified residential institution as the place of occurrence of the external cause: Secondary | ICD-10-CM | POA: Insufficient documentation

## 2012-03-01 DIAGNOSIS — Z79899 Other long term (current) drug therapy: Secondary | ICD-10-CM | POA: Insufficient documentation

## 2012-03-01 DIAGNOSIS — Y929 Unspecified place or not applicable: Secondary | ICD-10-CM | POA: Insufficient documentation

## 2012-03-01 DIAGNOSIS — I4891 Unspecified atrial fibrillation: Secondary | ICD-10-CM | POA: Insufficient documentation

## 2012-03-01 DIAGNOSIS — I2510 Atherosclerotic heart disease of native coronary artery without angina pectoris: Secondary | ICD-10-CM | POA: Insufficient documentation

## 2012-03-01 DIAGNOSIS — N182 Chronic kidney disease, stage 2 (mild): Secondary | ICD-10-CM | POA: Insufficient documentation

## 2012-03-01 DIAGNOSIS — E785 Hyperlipidemia, unspecified: Secondary | ICD-10-CM | POA: Insufficient documentation

## 2012-03-01 DIAGNOSIS — Z8719 Personal history of other diseases of the digestive system: Secondary | ICD-10-CM | POA: Insufficient documentation

## 2012-03-01 DIAGNOSIS — K219 Gastro-esophageal reflux disease without esophagitis: Secondary | ICD-10-CM | POA: Insufficient documentation

## 2012-03-01 DIAGNOSIS — IMO0002 Reserved for concepts with insufficient information to code with codable children: Secondary | ICD-10-CM | POA: Insufficient documentation

## 2012-03-01 DIAGNOSIS — F3289 Other specified depressive episodes: Secondary | ICD-10-CM | POA: Insufficient documentation

## 2012-03-01 DIAGNOSIS — Z043 Encounter for examination and observation following other accident: Secondary | ICD-10-CM | POA: Insufficient documentation

## 2012-03-01 DIAGNOSIS — Z950 Presence of cardiac pacemaker: Secondary | ICD-10-CM | POA: Insufficient documentation

## 2012-03-01 DIAGNOSIS — W19XXXA Unspecified fall, initial encounter: Secondary | ICD-10-CM | POA: Insufficient documentation

## 2012-03-01 DIAGNOSIS — Z9861 Coronary angioplasty status: Secondary | ICD-10-CM | POA: Insufficient documentation

## 2012-03-01 DIAGNOSIS — S199XXA Unspecified injury of neck, initial encounter: Secondary | ICD-10-CM | POA: Insufficient documentation

## 2012-03-01 DIAGNOSIS — Y939 Activity, unspecified: Secondary | ICD-10-CM | POA: Insufficient documentation

## 2012-03-01 DIAGNOSIS — E119 Type 2 diabetes mellitus without complications: Secondary | ICD-10-CM | POA: Insufficient documentation

## 2012-03-01 DIAGNOSIS — Y9389 Activity, other specified: Secondary | ICD-10-CM | POA: Insufficient documentation

## 2012-03-01 DIAGNOSIS — I509 Heart failure, unspecified: Secondary | ICD-10-CM | POA: Insufficient documentation

## 2012-03-01 DIAGNOSIS — I252 Old myocardial infarction: Secondary | ICD-10-CM | POA: Insufficient documentation

## 2012-03-01 DIAGNOSIS — R296 Repeated falls: Secondary | ICD-10-CM | POA: Insufficient documentation

## 2012-03-01 DIAGNOSIS — I129 Hypertensive chronic kidney disease with stage 1 through stage 4 chronic kidney disease, or unspecified chronic kidney disease: Secondary | ICD-10-CM | POA: Insufficient documentation

## 2012-03-01 LAB — URINALYSIS, ROUTINE W REFLEX MICROSCOPIC
Leukocytes, UA: NEGATIVE
Protein, ur: NEGATIVE mg/dL
Urobilinogen, UA: 1 mg/dL (ref 0.0–1.0)

## 2012-03-01 LAB — CBC
HCT: 37.4 % — ABNORMAL LOW (ref 39.0–52.0)
Hemoglobin: 12.6 g/dL — ABNORMAL LOW (ref 13.0–17.0)
MCH: 29.3 pg (ref 26.0–34.0)
MCHC: 33.7 g/dL (ref 30.0–36.0)
MCV: 87 fL (ref 78.0–100.0)
Platelets: 171 10*3/uL (ref 150–400)
RBC: 4.3 MIL/uL (ref 4.22–5.81)
RDW: 14.7 % (ref 11.5–15.5)
WBC: 7.5 10*3/uL (ref 4.0–10.5)

## 2012-03-01 LAB — POCT I-STAT, CHEM 8
BUN: 22 mg/dL (ref 6–23)
Creatinine, Ser: 1.2 mg/dL (ref 0.50–1.35)
Hemoglobin: 11.2 g/dL — ABNORMAL LOW (ref 13.0–17.0)
Potassium: 3.4 mEq/L — ABNORMAL LOW (ref 3.5–5.1)
Sodium: 141 mEq/L (ref 135–145)

## 2012-03-01 LAB — HEPATIC FUNCTION PANEL
ALT: 13 U/L (ref 0–53)
AST: 19 U/L (ref 0–37)
Albumin: 3.4 g/dL — ABNORMAL LOW (ref 3.5–5.2)
Alkaline Phosphatase: 77 U/L (ref 39–117)
Bilirubin, Direct: 0.3 mg/dL (ref 0.0–0.3)
Indirect Bilirubin: 1.3 mg/dL — ABNORMAL HIGH (ref 0.3–0.9)
Total Bilirubin: 1.6 mg/dL — ABNORMAL HIGH (ref 0.3–1.2)
Total Protein: 7.3 g/dL (ref 6.0–8.3)

## 2012-03-01 NOTE — ED Notes (Signed)
ZOX:[id number]<AV> Expected date:03/01/12<BR> Expected time: 7:07 AM<BR> Means of arrival:Ambulance<BR> Comments:<BR> 78 yo M  Falls x 2  Altered mental status

## 2012-03-01 NOTE — ED Notes (Signed)
Pt from [facility]. Per EMS report, pt fell at facility and has AMS. Pt arrives in full spinal protocol at this time. Pt denies pain or discomfort.

## 2012-03-01 NOTE — ED Provider Notes (Signed)
History     CSN: [id number]  Arrival date & time 03/01/12  4098   First MD Initiated Contact with Patient 03/01/12 [phone]      Chief Complaint  Patient presents with  . Fall    (Consider location/radiation/quality/duration/timing/severity/associated sxs/prior treatment) Patient is a 78 y.o. male presenting with fall. The history is provided by the EMS personnel and the nursing home. The history is limited by the condition of the patient.  Fall   patient here from nursing home after being found on the ground after an unwitnessed fall. He does have a history of Alzheimer's and is of patient's history is limited. [facility], they found him on the ground. Patient placed on backboard in C-spine process and transported here  Past Medical History  Diagnosis Date  . Cardiac pacemaker in situ     s/p Viatron DDD Pacemaker for sinus node dysfunction  . Orthostatic hypotension   . Hyperlipidemia     mixed  . Atrial fibrillation     not felt to be a coumadin candidate due to falls  . Sinoatrial node dysfunction     s/p PPM (MDT)  . Coronary atherosclerosis of native coronary artery     s/p AWMI 2005 stent LAD  . Diabetes mellitus, type 2   . Chronic renal insufficiency, stage II (mild)   . Intolerance, drug     amniodarone  . Upper GI hemorrhage     2006 Mallory Weiss Tear  . MI (myocardial infarction)     coronary artery status post anterior wall myocardial infarction in 2005 treated with a stent to the left anterior descending.  . Dizziness     recurrent dizziness and falls  . Diabetes mellitus   . Hyperlipidemia   . Renal insufficiency     mild  . Dementia   . Depression   . CHF (congestive heart failure)   . GERD (gastroesophageal reflux disease)     Past Surgical History  Procedure Date  . Pacemaker placement   . Coronary stent placement     Family History  Problem Relation Age of Onset  . Coronary artery disease Brother   . Diabetes Mother     History   Substance Use Topics  . Smoking status: Former Smoker    Quit date: 03/28/1960  . Smokeless tobacco: Not on file  . Alcohol Use: No      Review of Systems  Unable to perform ROS   Allergies  Sulfonamide derivatives and Amiodarone hcl  Home Medications   Current Outpatient Rx  Name  Route  Sig  Dispense  Refill  . ACETAMINOPHEN 500 MG PO TABS   Oral   Take 1,000 mg by mouth every 6 (six) hours as needed. pain         . ATORVASTATIN CALCIUM 10 MG PO TABS   Oral   Take 10 mg by mouth daily.         Marland Kitchen VITAMIN D 1000 UNITS PO TABS   Oral   Take 1,000 Units by mouth daily.         Marland Kitchen CLOPIDOGREL BISULFATE 75 MG PO TABS   Oral   Take 75 mg by mouth daily.           Marland Kitchen DEXTROMETHORPHAN POLISTIREX ER 30 MG/5ML PO LQCR   Oral   Take 30 mg by mouth 2 (two) times daily as needed. For cough         . GLUCOSE 40 % PO GEL  Oral   Take 1 Tube by mouth once as needed. For glucose levels < 70         . DIGOXIN 0.25 MG PO TABS   Oral   Take 250 mcg by mouth daily.         . DONEPEZIL HCL 5 MG PO TABS   Oral   Take 5 mg by mouth at bedtime.          . OCUSOFT LID SCRUB EX   Apply externally   Apply 1 each topically 2 (two) times daily. Apply 1 pad to each eye         . FERROUS SULFATE 325 (65 FE) MG PO TABS   Oral   Take 325 mg by mouth daily with breakfast.           . FLUDROCORTISONE ACETATE 0.1 MG PO TABS   Oral   Take 0.1 mg by mouth 2 (two) times daily.          . FUROSEMIDE 20 MG PO TABS   Oral   Take 20 mg by mouth daily.         Marland Kitchen HALOPERIDOL 0.5 MG PO TABS   Oral   Take 0.5 mg by mouth at bedtime.          . IBUPROFEN 400 MG PO TABS   Oral   Take 400 mg by mouth every 6 (six) hours as needed. pain         . LATANOPROST 0.005 % OP SOLN   Both Eyes   Place 1 drop into both eyes at bedtime.         Marland Kitchen LORAZEPAM 0.5 MG PO TABS   Oral   Take 1 mg by mouth every 8 (eight) hours as needed. anxiety         . MAGNESIUM  OXIDE 400 MG PO TABS   Oral   Take 400 mg by mouth daily. MAR states that patient takes 420mg .         Marland Kitchen METFORMIN HCL 500 MG PO TABS   Oral   Take 500 mg by mouth 2 (two) times daily with a meal.         . METOPROLOL TARTRATE 50 MG PO TABS   Oral   Take 1 tablet (50 mg total) by mouth 2 (two) times daily.         . NEOMYCIN-BACITRACIN ZN-POLYMYX 5-400-10000 OP OINT   Both Eyes   Place 1 application into both eyes 4 (four) times daily. One drop in each eye four times a day for 7 days         . OMEPRAZOLE 40 MG PO CPDR   Oral   Take 40 mg by mouth daily.         Marland Kitchen POLYETHYL GLYCOL-PROPYL GLYCOL 0.4-0.3 % OP SOLN   Ophthalmic   Apply 1 drop to eye 4 (four) times daily.          Marland Kitchen POLYVINYL ALCOHOL 1.4 % OP SOLN   Both Eyes   Place 1 drop into both eyes daily. Every hour while awake         . POTASSIUM CHLORIDE CRYS ER 20 MEQ PO TBCR   Oral   Take 20 mEq by mouth daily.           Marland Kitchen ROSUVASTATIN CALCIUM 10 MG PO TABS   Oral   Take 5 mg by mouth daily.          . VENLAFAXINE  HCL ER 150 MG PO CP24   Oral   Take 150 mg by mouth daily.             BP 165/82  Pulse 71  Temp 97.7 F (36.5 C) (Oral)  Resp 14  SpO2 95%  Physical Exam  Nursing note and vitals reviewed. Constitutional: He appears well-developed and well-nourished.  Non-toxic appearance. No distress.  HENT:  Head: Normocephalic and atraumatic.  Eyes: Conjunctivae normal, EOM and lids are normal. Pupils are equal, round, and reactive to light.  Neck: Normal range of motion. Neck supple. No tracheal deviation present. No mass present.  Cardiovascular: Normal rate, regular rhythm and normal heart sounds.  Exam reveals no gallop.   No murmur heard. Pulmonary/Chest: Effort normal and breath sounds normal. No stridor. No respiratory distress. He has no decreased breath sounds. He has no wheezes. He has no rhonchi. He has no rales.  Abdominal: Soft. Normal appearance and bowel sounds are  normal. He exhibits no distension. There is no tenderness. There is no rebound and no CVA tenderness.  Musculoskeletal: Normal range of motion. He exhibits no edema and no tenderness.       No shortening or rotation of his lower limbs  Neurological: He is alert. He has normal strength. No cranial nerve deficit or sensory deficit.       Patient was all 4 extremities  Skin: Skin is warm and dry. No abrasion and no rash noted.  Psychiatric: His affect is blunt. His speech is delayed. He is withdrawn.    ED Course  Procedures (including critical care time)  Labs Reviewed - No data to display No results found.   No diagnosis found.    MDM  Patient's labs and x-rays reviewed. He stable for dischage        Toy Baker, MD 03/01/12 [phone]

## 2012-03-01 NOTE — ED Notes (Signed)
[facility] contacted for transport to [facility].

## 2012-03-01 NOTE — ED Notes (Signed)
[facility] called for transport.  

## 2012-03-01 NOTE — ED Notes (Signed)
ED PA at bedside

## 2012-03-01 NOTE — ED Notes (Signed)
Discharge report called to [facility] at [place].

## 2012-03-01 NOTE — ED Notes (Signed)
Waiting for pt to be put into room. Will get rectal temp then. Unable to obtain oral temp.

## 2012-03-01 NOTE — ED Notes (Signed)
Spoke with nurse Vikki Ports at [place] regarding pts fall. Pt was an unwitnessed fall. Staff denies LOC.

## 2012-03-01 NOTE — ED Provider Notes (Signed)
History     CSN: [id number]  Arrival date & time 03/01/12  1623   First MD Initiated Contact with Patient 03/01/12 1740      Chief Complaint  Patient presents with  . Fall    (Consider location/radiation/quality/duration/timing/severity/associated sxs/prior treatment) HPI  The patient presents to the ED with a fall. This history was provided by the patient's daughter and an employer at the facility.  The fall was witnessed and occurred while the patient was trying to stand up from a seated position.  The patient was evaluated here this morning for another fall.  He has had 2 witnessed falls today and two episodes of patient being found out of his bed.  The patient currently lives at a [place].  They report the patient collapsed to the floor without LOC.  No medication change in over 1 month.    Past Medical History  Diagnosis Date  . Cardiac pacemaker in situ     s/p Viatron DDD Pacemaker for sinus node dysfunction  . Orthostatic hypotension   . Hyperlipidemia     mixed  . Atrial fibrillation     not felt to be a coumadin candidate due to falls  . Sinoatrial node dysfunction     s/p PPM (MDT)  . Coronary atherosclerosis of native coronary artery     s/p AWMI 2005 stent LAD  . Diabetes mellitus, type 2   . Chronic renal insufficiency, stage II (mild)   . Intolerance, drug     amniodarone  . Upper GI hemorrhage     2006 Mallory Weiss Tear  . MI (myocardial infarction)     coronary artery status post anterior wall myocardial infarction in 2005 treated with a stent to the left anterior descending.  . Dizziness     recurrent dizziness and falls  . Diabetes mellitus   . Hyperlipidemia   . Renal insufficiency     mild  . Dementia   . Depression   . CHF (congestive heart failure)   . GERD (gastroesophageal reflux disease)     Past Surgical History  Procedure Date  . Pacemaker placement   . Coronary stent placement     Family History  Problem Relation Age of Onset    . Coronary artery disease Brother   . Diabetes Mother     History  Substance Use Topics  . Smoking status: Former Smoker    Quit date: 03/28/1960  . Smokeless tobacco: Not on file  . Alcohol Use: No      Review of Systems Unable to perform due to dementia.  Allergies  Sulfonamide derivatives and Amiodarone hcl  Home Medications   Current Outpatient Rx  Name  Route  Sig  Dispense  Refill  . ACETAMINOPHEN 500 MG PO TABS   Oral   Take 1,000 mg by mouth every 6 (six) hours as needed. pain         . ATORVASTATIN CALCIUM 10 MG PO TABS   Oral   Take 10 mg by mouth daily.         Marland Kitchen BRIMONIDINE TARTRATE 0.15 % OP SOLN   Right Eye   Place 1 drop into the right eye 2 (two) times daily.         Marland Kitchen CLOPIDOGREL BISULFATE 75 MG PO TABS   Oral   Take 75 mg by mouth daily.           Marland Kitchen DEXTROMETHORPHAN POLISTIREX ER 30 MG/5ML PO LQCR   Oral   Take  30 mg by mouth 2 (two) times daily as needed. For cough         . GLUCOSE 40 % PO GEL   Oral   Take 1 Tube by mouth once as needed. For glucose levels < 70         . DIGOXIN 0.25 MG PO TABS   Oral   Take 250 mcg by mouth daily.         Marland Kitchen FERROUS SULFATE 325 (65 FE) MG PO TABS   Oral   Take 325 mg by mouth daily with breakfast.           . FLUDROCORTISONE ACETATE 0.1 MG PO TABS   Oral   Take 0.1 mg by mouth 2 (two) times daily.          . FUROSEMIDE 20 MG PO TABS   Oral   Take 20 mg by mouth daily.         Marland Kitchen LATANOPROST 0.005 % OP SOLN   Both Eyes   Place 1 drop into both eyes at bedtime.         Marland Kitchen LORAZEPAM 0.5 MG PO TABS   Oral   Take 1 mg by mouth every 8 (eight) hours as needed. anxiety         . METFORMIN HCL 500 MG PO TABS   Oral   Take 500 mg by mouth 2 (two) times daily with a meal.         . METOPROLOL TARTRATE 50 MG PO TABS   Oral   Take 1 tablet (50 mg total) by mouth 2 (two) times daily.         Marland Kitchen OMEPRAZOLE 40 MG PO CPDR   Oral   Take 40 mg by mouth daily.         Marland Kitchen  POLYETHYL GLYCOL-PROPYL GLYCOL 0.4-0.3 % OP SOLN   Ophthalmic   Apply 1 drop to eye 4 (four) times daily.          Marland Kitchen POTASSIUM CHLORIDE CRYS ER 20 MEQ PO TBCR   Oral   Take 20 mEq by mouth daily.           . TRAVOPROST (BAK FREE) 0.004 % OP SOLN   Both Eyes   Place 1 drop into both eyes at bedtime.         . VENLAFAXINE HCL ER 150 MG PO CP24   Oral   Take 150 mg by mouth daily.             BP 175/71  Pulse 104  Temp 99.1 F (37.3 C) (Rectal)  Resp 17  SpO2 98%  Physical Exam  Constitutional: He appears well-developed.  Eyes: Left eye exhibits exudate. Left conjunctiva is injected.  Cardiovascular: S1 normal and S2 normal.  An irregularly irregular rhythm present.  Murmur heard.  Systolic murmur is present with a grade of 3/6  Pulmonary/Chest: No accessory muscle usage. No respiratory distress. He has no decreased breath sounds. He has no wheezes. He exhibits no tenderness.       Ascultation to anterior and lateral chest  Musculoskeletal:       Left shoulder: Normal. He exhibits no tenderness, no bony tenderness, no deformity and no laceration.       Legs: Neurological: He is alert.  Skin: Skin is warm and dry.       ED Course  Procedures (including critical care time) LACERATION REPAIR Performed by: Carlyle Dolly Authorized by: Carlyle Dolly Consent: Verbal consent  obtained. Risks and benefits: risks, benefits and alternatives were discussed Consent given by: patient Patient identity confirmed: provided demographic data Prepped and Draped in normal sterile fashion Wound explored  Laceration Location: L lateral elbow  Laceration Length: 5 cm  No Foreign Bodies seen or palpated  Amount of cleaning: standard  Skin closure: 4 1/2 inch Steri-Strip  Patient tolerance: Patient tolerated the procedure well with no immediate complications.  LACERATION REPAIR Performed by: Carlyle Dolly Authorized by: Carlyle Dolly Consent: Verbal consent obtained. Risks and benefits: risks, benefits and alternatives were discussed Consent given by: patient Patient identity confirmed: provided demographic data Prepped and Draped in normal sterile fashion Wound explored  Laceration Location: L lateral elbow, superior lesion  Laceration Length:2.5 cm  No Foreign Bodies seen or palpated  Irrigation method: syringe Amount of cleaning: standard  Skin closure: Steri-Strip  Technique: 2 1/2 inch Steri-strips  Patient tolerance: Patient tolerated the procedure well with no immediate complications.     MDM          Carlyle Dolly, PA-C 03/01/12 2044

## 2012-03-01 NOTE — ED Notes (Signed)
Pt in from [place] by ems. Witnessed fall by staff. Unknown if pt hit head. Pt has Hx dementia. Was seen here for another fall early this am. Staff reported to ems that pt was not at his norm upon arrival back to facility this am. Pt on KED, c-collar in place, c/o neck and back pain with movement.

## 2012-03-02 LAB — URINE CULTURE

## 2012-03-02 NOTE — ED Provider Notes (Signed)
Medical screening examination/treatment/procedure(s) were performed by non-physician practitioner and as supervising physician I was immediately available for consultation/collaboration.  Juliet Rude. Rubin Payor, MD 03/02/12 [phone]

## 2012-03-06 ENCOUNTER — Inpatient Hospital Stay (HOSPITAL_COMMUNITY)
Admission: EM | Admit: 2012-03-06 | Discharge: 2012-03-07 | DRG: 690 | Disposition: A | Discharge status: Nursing Home (Includes ICF) | Payer: MEDICARE | Attending: Emergency Medicine | Admitting: Emergency Medicine

## 2012-03-06 ENCOUNTER — Encounter (HOSPITAL_COMMUNITY): Payer: Self-pay | Admitting: Vascular Surgery

## 2012-03-06 DIAGNOSIS — I2510 Atherosclerotic heart disease of native coronary artery without angina pectoris: Secondary | ICD-10-CM

## 2012-03-06 DIAGNOSIS — E46 Unspecified protein-calorie malnutrition: Secondary | ICD-10-CM | POA: Diagnosis present

## 2012-03-06 DIAGNOSIS — Z950 Presence of cardiac pacemaker: Secondary | ICD-10-CM

## 2012-03-06 DIAGNOSIS — R269 Unspecified abnormalities of gait and mobility: Secondary | ICD-10-CM

## 2012-03-06 DIAGNOSIS — I5022 Chronic systolic (congestive) heart failure: Secondary | ICD-10-CM | POA: Diagnosis present

## 2012-03-06 DIAGNOSIS — N390 Urinary tract infection, site not specified: Principal | ICD-10-CM

## 2012-03-06 DIAGNOSIS — Z79899 Other long term (current) drug therapy: Secondary | ICD-10-CM

## 2012-03-06 DIAGNOSIS — Z87891 Personal history of nicotine dependence: Secondary | ICD-10-CM

## 2012-03-06 DIAGNOSIS — Z681 Body mass index (BMI) 19 or less, adult: Secondary | ICD-10-CM

## 2012-03-06 DIAGNOSIS — I495 Sick sinus syndrome: Secondary | ICD-10-CM

## 2012-03-06 DIAGNOSIS — Y9210 Unspecified residential institution as the place of occurrence of the external cause: Secondary | ICD-10-CM | POA: Diagnosis present

## 2012-03-06 DIAGNOSIS — F3289 Other specified depressive episodes: Secondary | ICD-10-CM | POA: Diagnosis present

## 2012-03-06 DIAGNOSIS — I4891 Unspecified atrial fibrillation: Secondary | ICD-10-CM

## 2012-03-06 DIAGNOSIS — E119 Type 2 diabetes mellitus without complications: Secondary | ICD-10-CM | POA: Diagnosis present

## 2012-03-06 DIAGNOSIS — R296 Repeated falls: Secondary | ICD-10-CM

## 2012-03-06 DIAGNOSIS — W19XXXA Unspecified fall, initial encounter: Secondary | ICD-10-CM | POA: Diagnosis present

## 2012-03-06 DIAGNOSIS — S0180XA Unspecified open wound of other part of head, initial encounter: Secondary | ICD-10-CM | POA: Diagnosis present

## 2012-03-06 DIAGNOSIS — F329 Major depressive disorder, single episode, unspecified: Secondary | ICD-10-CM | POA: Diagnosis present

## 2012-03-06 DIAGNOSIS — K219 Gastro-esophageal reflux disease without esophagitis: Secondary | ICD-10-CM | POA: Diagnosis present

## 2012-03-06 DIAGNOSIS — I255 Ischemic cardiomyopathy: Secondary | ICD-10-CM

## 2012-03-06 DIAGNOSIS — H01009 Unspecified blepharitis unspecified eye, unspecified eyelid: Secondary | ICD-10-CM | POA: Diagnosis present

## 2012-03-06 DIAGNOSIS — Z66 Do not resuscitate: Secondary | ICD-10-CM | POA: Diagnosis present

## 2012-03-06 DIAGNOSIS — I509 Heart failure, unspecified: Secondary | ICD-10-CM | POA: Diagnosis present

## 2012-03-06 DIAGNOSIS — R4182 Altered mental status, unspecified: Secondary | ICD-10-CM

## 2012-03-06 DIAGNOSIS — S0181XA Laceration without foreign body of other part of head, initial encounter: Secondary | ICD-10-CM

## 2012-03-06 DIAGNOSIS — I2589 Other forms of chronic ischemic heart disease: Secondary | ICD-10-CM | POA: Diagnosis present

## 2012-03-06 LAB — CBC WITH DIFFERENTIAL/PLATELET
Basophils Relative: 0 % (ref 0–1)
Eosinophils Absolute: 0.1 10*3/uL (ref 0.0–0.7)
Eosinophils Relative: 3 % (ref 0–5)
HCT: 31.6 % — ABNORMAL LOW (ref 39.0–52.0)
Hemoglobin: 10.3 g/dL — ABNORMAL LOW (ref 13.0–17.0)
Lymphs Abs: 0.7 10*3/uL (ref 0.7–4.0)
MCH: 28.9 pg (ref 26.0–34.0)
MCHC: 32.6 g/dL (ref 30.0–36.0)
MCV: 88.8 fL (ref 78.0–100.0)
Monocytes Absolute: 0.8 10*3/uL (ref 0.1–1.0)
Monocytes Relative: 15 % — ABNORMAL HIGH (ref 3–12)
Neutrophils Relative %: 70 % (ref 43–77)
RBC: 3.56 MIL/uL — ABNORMAL LOW (ref 4.22–5.81)

## 2012-03-06 LAB — COMPREHENSIVE METABOLIC PANEL
Albumin: 2.8 g/dL — ABNORMAL LOW (ref 3.5–5.2)
Alkaline Phosphatase: 79 U/L (ref 39–117)
BUN: 27 mg/dL — ABNORMAL HIGH (ref 6–23)
Calcium: 8.6 mg/dL (ref 8.4–10.5)
Creatinine, Ser: 1.33 mg/dL (ref 0.50–1.35)
GFR calc Af Amer: 54 mL/min — ABNORMAL LOW (ref >90)
Glucose, Bld: 129 mg/dL — ABNORMAL HIGH (ref 70–99)
Potassium: 3.9 mEq/L (ref 3.5–5.1)
Total Protein: 6.6 g/dL (ref 6.0–8.3)

## 2012-03-06 LAB — URINE MICROSCOPIC-ADD ON

## 2012-03-06 LAB — GLUCOSE, CAPILLARY: Glucose-Capillary: 112 mg/dL — ABNORMAL HIGH (ref 70–99)

## 2012-03-06 LAB — CBC
HCT: 31.5 % — ABNORMAL LOW (ref 39.0–52.0)
MCHC: 32.4 g/dL (ref 30.0–36.0)
RDW: 14.7 % (ref 11.5–15.5)

## 2012-03-06 LAB — URINALYSIS, ROUTINE W REFLEX MICROSCOPIC
Glucose, UA: NEGATIVE mg/dL
Ketones, ur: NEGATIVE mg/dL
Nitrite: NEGATIVE
Protein, ur: 30 mg/dL — AB
pH: 6.5 (ref 5.0–8.0)

## 2012-03-06 LAB — CREATININE, SERUM: GFR calc non Af Amer: 50 mL/min — ABNORMAL LOW (ref >90)

## 2012-03-06 MED ORDER — METOPROLOL TARTRATE 50 MG PO TABS
50.0000 mg | ORAL_TABLET | Freq: Two times a day (BID) | ORAL | Status: DC
  Administered 2012-03-06 – 2012-03-07 (×2): 50 mg via ORAL
  Filled 2012-03-06 (×3): qty 1

## 2012-03-06 MED ORDER — INSULIN ASPART 100 UNIT/ML ~~LOC~~ SOLN: 0.0000 [IU] | Freq: Three times a day (TID) | SUBCUTANEOUS | Status: DC

## 2012-03-06 MED ORDER — ARTIFICIAL TEARS OP OINT
TOPICAL_OINTMENT | Freq: Three times a day (TID) | OPHTHALMIC | Status: DC | PRN
  Filled 2012-03-06: qty 3.5

## 2012-03-06 MED ORDER — ACETAMINOPHEN 650 MG RE SUPP: 650.0000 mg | Freq: Four times a day (QID) | RECTAL | Status: DC | PRN

## 2012-03-06 MED ORDER — FERROUS SULFATE 325 (65 FE) MG PO TABS
325.0000 mg | ORAL_TABLET | Freq: Every day | ORAL | Status: DC
  Administered 2012-03-07: 325 mg via ORAL
  Filled 2012-03-06 (×2): qty 1

## 2012-03-06 MED ORDER — POLYVINYL ALCOHOL 1.4 % OP SOLN
1.0000 [drp] | Freq: Every day | OPHTHALMIC | Status: DC
  Administered 2012-03-06 – 2012-03-07 (×4): 1 [drp] via OPHTHALMIC
  Filled 2012-03-06: qty 15

## 2012-03-06 MED ORDER — FUROSEMIDE 20 MG PO TABS
20.0000 mg | ORAL_TABLET | Freq: Every day | ORAL | Status: DC
  Administered 2012-03-07: 20 mg via ORAL
  Filled 2012-03-06: qty 1

## 2012-03-06 MED ORDER — VENLAFAXINE HCL ER 150 MG PO CP24
150.0000 mg | ORAL_CAPSULE | Freq: Every day | ORAL | Status: DC
  Administered 2012-03-07: 150 mg via ORAL
  Filled 2012-03-06: qty 1

## 2012-03-06 MED ORDER — BRIMONIDINE TARTRATE 0.2 % OP SOLN
1.0000 [drp] | Freq: Two times a day (BID) | OPHTHALMIC | Status: DC
  Administered 2012-03-06 – 2012-03-07 (×2): 1 [drp] via OPHTHALMIC
  Filled 2012-03-06: qty 5

## 2012-03-06 MED ORDER — REFRESH LACRI-LUBE OP OINT: 1.0000 "application " | TOPICAL_OINTMENT | Freq: Three times a day (TID) | OPHTHALMIC | Status: DC | PRN

## 2012-03-06 MED ORDER — SODIUM CHLORIDE 0.9 % IJ SOLN: 3.0000 mL | Freq: Two times a day (BID) | INTRAMUSCULAR | Status: DC

## 2012-03-06 MED ORDER — DEXTROSE 5 % IV SOLN
1.0000 g | INTRAVENOUS | Status: DC
  Administered 2012-03-06: 1 g via INTRAVENOUS
  Filled 2012-03-06 (×2): qty 10

## 2012-03-06 MED ORDER — BRIMONIDINE TARTRATE 0.15 % OP SOLN
1.0000 [drp] | Freq: Two times a day (BID) | OPHTHALMIC | Status: DC
  Filled 2012-03-06: qty 5

## 2012-03-06 MED ORDER — SODIUM CHLORIDE 0.9 % IJ SOLN: 3.0000 mL | INTRAMUSCULAR | Status: DC | PRN

## 2012-03-06 MED ORDER — LATANOPROST 0.005 % OP SOLN
1.0000 [drp] | Freq: Every day | OPHTHALMIC | Status: DC
Start: 2012-03-06 — End: 2012-03-07
  Administered 2012-03-06: 1 [drp] via OPHTHALMIC
  Filled 2012-03-06: qty 2.5

## 2012-03-06 MED ORDER — PANTOPRAZOLE SODIUM 40 MG PO TBEC
40.0000 mg | DELAYED_RELEASE_TABLET | Freq: Every day | ORAL | Status: DC
  Administered 2012-03-07: 40 mg via ORAL
  Filled 2012-03-06: qty 1

## 2012-03-06 MED ORDER — DIGOXIN 250 MCG PO TABS
250.0000 ug | ORAL_TABLET | Freq: Every day | ORAL | Status: DC
  Administered 2012-03-07: 250 ug via ORAL
  Filled 2012-03-06: qty 1

## 2012-03-06 MED ORDER — SODIUM CHLORIDE 0.9 % IV SOLN: 250.0000 mL | INTRAVENOUS | Status: DC | PRN

## 2012-03-06 MED ORDER — POLYETHYL GLYCOL-PROPYL GLYCOL 0.4-0.3 % OP SOLN: 1.0000 [drp] | Freq: Every day | OPHTHALMIC | Status: DC

## 2012-03-06 MED ORDER — ATORVASTATIN CALCIUM 10 MG PO TABS
10.0000 mg | ORAL_TABLET | Freq: Every day | ORAL | Status: DC
  Administered 2012-03-07: 10 mg via ORAL
  Filled 2012-03-06: qty 1

## 2012-03-06 MED ORDER — TRAVOPROST (BAK FREE) 0.004 % OP SOLN
1.0000 [drp] | Freq: Every day | OPHTHALMIC | Status: DC
  Administered 2012-03-06: 1 [drp] via OPHTHALMIC
  Filled 2012-03-06: qty 2.5

## 2012-03-06 MED ORDER — POTASSIUM CHLORIDE CRYS ER 20 MEQ PO TBCR
20.0000 meq | EXTENDED_RELEASE_TABLET | Freq: Every day | ORAL | Status: DC
  Administered 2012-03-07: 20 meq via ORAL
  Filled 2012-03-06: qty 1

## 2012-03-06 MED ORDER — DEXTROMETHORPHAN POLISTIREX 30 MG/5ML PO LQCR
30.0000 mg | Freq: Two times a day (BID) | ORAL | Status: DC | PRN
  Filled 2012-03-06: qty 5

## 2012-03-06 MED ORDER — ACETAMINOPHEN 325 MG PO TABS: 650.0000 mg | ORAL_TABLET | Freq: Four times a day (QID) | ORAL | Status: DC | PRN

## 2012-03-06 MED ORDER — SODIUM CHLORIDE 0.9 % IJ SOLN
3.0000 mL | Freq: Two times a day (BID) | INTRAMUSCULAR | Status: DC
  Administered 2012-03-06 – 2012-03-07 (×2): 3 mL via INTRAVENOUS

## 2012-03-06 MED ORDER — CLOPIDOGREL BISULFATE 75 MG PO TABS
75.0000 mg | ORAL_TABLET | Freq: Every day | ORAL | Status: DC
  Administered 2012-03-07: 75 mg via ORAL
  Filled 2012-03-06: qty 1

## 2012-03-06 MED ORDER — ACETAMINOPHEN 325 MG PO TABS
650.0000 mg | ORAL_TABLET | Freq: Once | ORAL | Status: AC
  Administered 2012-03-06: 650 mg via ORAL
  Filled 2012-03-06: qty 2

## 2012-03-06 MED ORDER — GLUCOSE 40 % PO GEL: 1.0000 | Freq: Once | ORAL | Status: AC | PRN

## 2012-03-06 MED ORDER — FLUDROCORTISONE ACETATE 0.1 MG PO TABS
0.1000 mg | ORAL_TABLET | Freq: Two times a day (BID) | ORAL | Status: DC
  Administered 2012-03-06 – 2012-03-07 (×2): 0.1 mg via ORAL
  Filled 2012-03-06 (×3): qty 1

## 2012-03-06 NOTE — ED Provider Notes (Deleted)
Medical screening examination/treatment/procedure(s) were conducted as a shared visit with non-physician practitioner(s) and myself.  I personally evaluated the patient during the encounter  Pt with multiple falls recently from memory care unit.  Level 5 caveat due to dementia.  Family is ok not performing another head CT, just had 2 in one day on March 01, 2012.  Pt with UTI, eye infection.  Will admit for treatment of UTI and altered mentation from baseline.    Gavin Pound. Tiphani Mells, MD 03/06/12 1536

## 2012-03-06 NOTE — ED Provider Notes (Addendum)
History     CSN: [id number]  Arrival date & time 03/06/12  1245   First MD Initiated Contact with Patient 03/06/12 1318      Chief Complaint  Patient presents with  . Fall    (Consider location/radiation/quality/duration/timing/severity/associated sxs/prior treatment) Patient is a 78 y.o. male presenting with fall. The history is provided by the patient. No language interpreter was used.  Fall The accident occurred less than 1 hour ago. The fall occurred while standing. He landed on a hard floor. The volume of blood lost was minimal. The point of impact was the head. The pain is present in the head. The pain is at a severity of 4/10. The pain is moderate. Associated symptoms include loss of consciousness.  Pt has had multiple falls recently.   Pt fell today and hit head.   No loc.  Pt has a cut to to left forehead.  Pt denies any current pain  Past Medical History  Diagnosis Date  . Cardiac pacemaker in situ     s/p Viatron DDD Pacemaker for sinus node dysfunction  . Orthostatic hypotension   . Hyperlipidemia     mixed  . Atrial fibrillation     not felt to be a coumadin candidate due to falls  . Sinoatrial node dysfunction     s/p PPM (MDT)  . Coronary atherosclerosis of native coronary artery     s/p AWMI 2005 stent LAD  . Diabetes mellitus, type 2   . Chronic renal insufficiency, stage II (mild)   . Intolerance, drug     amniodarone  . Upper GI hemorrhage     2006 Mallory Weiss Tear  . MI (myocardial infarction)     coronary artery status post anterior wall myocardial infarction in 2005 treated with a stent to the left anterior descending.  . Dizziness     recurrent dizziness and falls  . Diabetes mellitus   . Hyperlipidemia   . Renal insufficiency     mild  . Dementia   . Depression   . CHF (congestive heart failure)   . GERD (gastroesophageal reflux disease)     Past Surgical History  Procedure Date  . Pacemaker placement   . Coronary stent placement      Family History  Problem Relation Age of Onset  . Coronary artery disease Brother   . Diabetes Mother     History  Substance Use Topics  . Smoking status: Former Smoker    Quit date: 03/28/1960  . Smokeless tobacco: Not on file  . Alcohol Use: No      Review of Systems  Skin: Positive for wound.  Neurological: Positive for loss of consciousness.  All other systems reviewed and are negative.    Allergies  Sulfonamide derivatives and Amiodarone hcl  Home Medications   Current Outpatient Rx  Name  Route  Sig  Dispense  Refill  . ACETAMINOPHEN 500 MG PO TABS   Oral   Take 1,000 mg by mouth every 6 (six) hours as needed. pain         . ATORVASTATIN CALCIUM 10 MG PO TABS   Oral   Take 10 mg by mouth daily.         Marland Kitchen BRIMONIDINE TARTRATE 0.15 % OP SOLN   Right Eye   Place 1 drop into the right eye 2 (two) times daily.         Marland Kitchen CLOPIDOGREL BISULFATE 75 MG PO TABS   Oral   Take 75 mg  by mouth daily.           Marland Kitchen DEXTROMETHORPHAN POLISTIREX ER 30 MG/5ML PO LQCR   Oral   Take 30 mg by mouth 2 (two) times daily as needed. For cough         . GLUCOSE 40 % PO GEL   Oral   Take 1 Tube by mouth once as needed. For glucose levels < 70         . DIGOXIN 0.25 MG PO TABS   Oral   Take 250 mcg by mouth daily.         Marland Kitchen FERROUS SULFATE 325 (65 FE) MG PO TABS   Oral   Take 325 mg by mouth daily with breakfast.           . FLUDROCORTISONE ACETATE 0.1 MG PO TABS   Oral   Take 0.1 mg by mouth 2 (two) times daily.          . FUROSEMIDE 20 MG PO TABS   Oral   Take 20 mg by mouth daily.         Marland Kitchen LATANOPROST 0.005 % OP SOLN   Both Eyes   Place 1 drop into both eyes at bedtime.         Marland Kitchen LORAZEPAM 0.5 MG PO TABS   Oral   Take 1 mg by mouth every 8 (eight) hours as needed. anxiety         . METFORMIN HCL 500 MG PO TABS   Oral   Take 500 mg by mouth 2 (two) times daily with a meal.         . METOPROLOL TARTRATE 50 MG PO TABS   Oral    Take 1 tablet (50 mg total) by mouth 2 (two) times daily.         Marland Kitchen OMEPRAZOLE 40 MG PO CPDR   Oral   Take 40 mg by mouth daily.         Marland Kitchen POLYETHYL GLYCOL-PROPYL GLYCOL 0.4-0.3 % OP SOLN   Ophthalmic   Apply 1 drop to eye 4 (four) times daily.          Marland Kitchen POTASSIUM CHLORIDE CRYS ER 20 MEQ PO TBCR   Oral   Take 20 mEq by mouth daily.           . TRAVOPROST (BAK FREE) 0.004 % OP SOLN   Both Eyes   Place 1 drop into both eyes at bedtime.         . VENLAFAXINE HCL ER 150 MG PO CP24   Oral   Take 150 mg by mouth daily.             BP 140/74  Pulse 89  Temp 97.4 F (36.3 C) (Oral)  Resp 23  SpO2 98%  Physical Exam  Nursing note and vitals reviewed. Constitutional: He is oriented to person, place, and time. He appears well-developed and well-nourished.  HENT:  Head: Normocephalic and atraumatic.  Eyes: Conjunctivae normal and EOM are normal. Pupils are equal, round, and reactive to light.  Neck: Normal range of motion. Neck supple.  Cardiovascular: Normal rate.   Pulmonary/Chest: Effort normal.  Abdominal: Soft.  Musculoskeletal: Normal range of motion.  Neurological: He is alert and oriented to person, place, and time. He has normal reflexes.  Skin: There is pallor.       Laceration forehead 1.5 cm gapping  Psychiatric: He has a normal mood and affect.    ED Course  LACERATION  REPAIR Date/Time: 03/06/2012 2:46 PM Performed by: Elson Areas Authorized by: Elson Areas Consent: Verbal consent not obtained. Risks and benefits: risks, benefits and alternatives were discussed Consent given by: patient Body area: head/neck Laceration length: 1.5 cm Foreign bodies: no foreign bodies Tendon involvement: none Nerve involvement: none Skin closure: glue   (including critical care time)  Labs Reviewed  GLUCOSE, CAPILLARY - Abnormal; Notable for the following:    Glucose-Capillary 112 (*)     All other components within normal limits  CBC WITH  DIFFERENTIAL  COMPREHENSIVE METABOLIC PANEL  URINALYSIS, ROUTINE W REFLEX MICROSCOPIC   No results found.   No diagnosis found.    MDM   Pt's daughter does not want pt to have a ct scan again.  Pt has had 5 falls in the last week.   She does not feel pt is safe at the facility where he lives.  She reports pt was able to ambulate some before recent falls.  They have been trying to keep pt in a wheelchair but pt keeps trying to go to bathroom  Labs returned.   Pt has a uti.  I will culture urine.      I spoke to the [facility] practice resident who will admit pt for evaluation.  I will treat uti with Rocephin.      Lonia Skinner [place], [place] 03/06/12 1447  Lonia Skinner [place], [place] 03/06/12 [address] [place], [place] 03/06/12 [phone]

## 2012-03-06 NOTE — H&P (Signed)
Family Medicine Teaching [facility] Admission History and Physical Service Pager: [phone]  Patient name: [name] [name] Medical record number: [id number] Date of birth: 1922-05-27 Age: 78 y.o. Gender: male  Primary Care Provider: Florentina Jenny, MD  Chief Complaint: Frequent Falls   Assessment and Plan: [name] [name] is a 78 y.o. year old male presenting with multiple falls and UTI  1. Frequent Falls - This has been ongoing for 5 days.  Does have history of falls including last March upon presentation into [facility]. 1. Pt previously seen in ED three times in the past 5 days and had multiple CT scans performed.  Per daughter, pt is DNR and even if he had a hemorrhagic stroke, she would not want surgery.  Pt with severe dementia and POA is his daughter.  Will hold off on CT scan for now.  However, pt has had CVA in the past, currently on Plavix and has residual left eye symptoms.  Also NO VTE PPx due to risk fall.   2. Pt has Hx of Afib with pacer.  Last check was done in May of 2013 and he sees Dr. Johney Frame from [facility].  Consider consulting for pacer check and also further recommendations. 3. May be secondary to his UTI.  Will treat and see if this improves 4. Will get Orthostatics on pt as he does have history of being on Florinef (unknown why).  Will also place on Telemetry and have neuro checks with vitals. F/U EKG in AM  5. CT scans previously do not show NPH.  Pt does not have incontinence at this time. 6. Will hold possibly sedating meds or anticholinergic medications that could affect balance and mentation.  This includes his home Ativan  7. OOB with assistance.  Bed alarm monitors in place. 8. PT/OT/CSW consult for possible placement.  May need to go to SNF for further management. 2. UTI - TNTC WBC on urine micro with many bacteria seen. 1. Afebrile, daughter denies recent fever as well.   2. No leukocytosis seen.  Will repeat CBC in AM along with BMP 3. F/U  UCx 4. Rocephin q 24 hrs 3. Atrial Fibrillation - Will place on monitor.  Rate controlled. Consider consulting cardiology for pacer check 1. Continue home meds 4. DM II - Holding Metformin pending possible contrast studies 1. SSI with CBG checks 5. GERD - Protonix 40 mg qd 6. Chronic Systolic CHF - Pt does not appear to be fluid overloaded at this time 1. Continue home Lasix 2. Monitor I/O and weights  7. Blepharitis - Continue home meds 8. FEN/GI: Heart Healthy Diet.  Monitor UOP for fluid status.  Protonix 9. Prophylaxis: SCD, no VTE PPx otherwise due to fall risk 10. Disposition: Pending PT/OT evaluation and observation for further events 11. Code Status: DNR  History of Present Illness: [name] [name] is a 78 y.o. year old male presenting with multiple falls over the past 5 days and UTI.  Pt at baseline is demented and cannot provide history.  History obtained from his daughter.  Per daughter, he was in his normal state of health up until 5 days ago. At that time he fell at his assisted living facility, hitting his head, and they sent him to the hospital for further evaluation.  Labwork and CT scan at this time did not show acute changes.  Pt returned to his ALF and had another episode where he fell, without witnessed syncope, and was sent back to the ED where  he had another w/u including CT which did not show acute changes.  Pt returned to the ALF and his daughter was watching him 2 days ago.  He continued to do well up until this morning when he attempted to get up to go to the bathroom and fell out of his wheelchair and hit his head.  He was brought back to the hospital at this point.  In the ED, after discussing with his daughter, a CT scan was not re-ordered.  Labwork was done and showed a BMP with albumin of 2.8, otherwise no gross abnormalities.  Also, a CBC was done and showed a Hgb of 10.3 (baseline around 11) but no leukocytosis.  A UA was performed showing a large amount of bacteria  with large LE.  This was sent for UCx and he was started on Ceftriaxone.  Per daughter, pt has not had any recent fever, changes in medications, travel, decreased oral intake, urinary incontinence.  The rest of the ROS cannot be completed   He was placed in this ALF back in March due to ADL restrictions and he had falls at that time.  He is followed by Dr. Redmond School who has been managing his medical conditions.  His Atrial Fibrillation is managed by Dr. Johney Frame and Dr. Clifton James at [facility].    Patient Active Problem List  Diagnosis  . DIABETES MELLITUS-TYPE II  . HYPERLIPIDEMIA-MIXED  . DEPRESSION  . CORONARY ARTERY DISEASE  . CAD, NATIVE VESSEL  . ATRIAL FIBRILLATION  . SICK SINUS/ TACHY-BRADY SYNDROME  . SYSTOLIC HEART FAILURE, CHRONIC  . HYPOTENSION, ORTHOSTATIC  . ESOPHAGEAL STRICTURE  . GERD  . PEPTIC STRICTURE  . RENAL INSUFFICIENCY  . GAIT DISTURBANCE  . WEIGHT LOSS-ABNORMAL  . CHEST PAIN-UNSPECIFIED  . DYSPHAGIA  . PACEMAKER, PERMANENT  . Cardiomyopathy, ischemic   Past Medical History: Past Medical History  Diagnosis Date  . Cardiac pacemaker in situ     s/p Viatron DDD Pacemaker for sinus node dysfunction  . Orthostatic hypotension   . Hyperlipidemia     mixed  . Atrial fibrillation     not felt to be a coumadin candidate due to falls  . Sinoatrial node dysfunction     s/p PPM (MDT)  . Coronary atherosclerosis of native coronary artery     s/p AWMI 2005 stent LAD  . Diabetes mellitus, type 2   . Chronic renal insufficiency, stage II (mild)   . Intolerance, drug     amniodarone  . Upper GI hemorrhage     2006 Mallory Weiss Tear  . MI (myocardial infarction)     coronary artery status post anterior wall myocardial infarction in 2005 treated with a stent to the left anterior descending.  . Dizziness     recurrent dizziness and falls  . Diabetes mellitus   . Hyperlipidemia   . Renal insufficiency     mild  . Dementia   . Depression   . CHF  (congestive heart failure)   . GERD (gastroesophageal reflux disease)    Past Surgical History: Past Surgical History  Procedure Date  . Pacemaker placement   . Coronary stent placement    Social History: History  Substance Use Topics  . Smoking status: Former Smoker    Quit date: 03/28/1960  . Smokeless tobacco: Not on file  . Alcohol Use: No   For any additional social history documentation, please refer to relevant sections of EMR.  Family History: Family History  Problem Relation Age of Onset  .  Coronary artery disease Brother   . Diabetes Mother    Allergies: Allergies  Allergen Reactions  . Sulfonamide Derivatives     REACTION: hives  . Amiodarone Hcl Rash   No current facility-administered medications on file prior to encounter.   Current Outpatient Prescriptions on File Prior to Encounter  Medication Sig Dispense Refill  . acetaminophen (TYLENOL) 500 MG tablet Take 1,000 mg by mouth every 6 (six) hours as needed. pain      . atorvastatin (LIPITOR) 10 MG tablet Take 10 mg by mouth daily.      . brimonidine (ALPHAGAN) 0.15 % ophthalmic solution Place 1 drop into the right eye 2 (two) times daily.      . clopidogrel (PLAVIX) 75 MG tablet Take 75 mg by mouth daily.        . digoxin (LANOXIN) 0.25 MG tablet Take 250 mcg by mouth daily.      . ferrous sulfate 325 (65 FE) MG tablet Take 325 mg by mouth daily with breakfast.        . fludrocortisone (FLORINEF) 0.1 MG tablet Take 0.1 mg by mouth 2 (two) times daily.       . furosemide (LASIX) 20 MG tablet Take 20 mg by mouth daily.      Marland Kitchen latanoprost (XALATAN) 0.005 % ophthalmic solution Place 1 drop into both eyes at bedtime.      Marland Kitchen LORazepam (ATIVAN) 0.5 MG tablet Take 1 mg by mouth every 8 (eight) hours as needed. anxiety      . metFORMIN (GLUCOPHAGE) 500 MG tablet Take 500 mg by mouth 2 (two) times daily with a meal.      . metoprolol (LOPRESSOR) 50 MG tablet Take 1 tablet (50 mg total) by mouth 2 (two) times daily.       Marland Kitchen omeprazole (PRILOSEC) 40 MG capsule Take 40 mg by mouth daily.      Bertram Gala Glycol-Propyl Glycol (SYSTANE) 0.4-0.3 % SOLN Apply 1 drop to eye 6 (six) times daily.       . potassium chloride SA (K-DUR,KLOR-CON) 20 MEQ tablet Take 20 mEq by mouth daily.        . Travoprost, BAK Free, (TRAVATAN) 0.004 % SOLN ophthalmic solution Place 1 drop into both eyes at bedtime.      Marland Kitchen venlafaxine (EFFEXOR-XR) 150 MG 24 hr capsule Take 150 mg by mouth daily.        Marland Kitchen dextromethorphan (DELSYM) 30 MG/5ML liquid Take 30 mg by mouth 2 (two) times daily as needed. For cough      . dextrose (GLUTOSE) 40 % GEL Take 1 Tube by mouth once as needed. For glucose levels < 70       Review Of Systems: Per HPI with the following additions:  Otherwise 12 point review of systems was performed and was unremarkable.  Physical Exam: BP 122/87  Pulse 79  Temp 98 F (36.7 C) (Rectal)  Resp 24  SpO2 99% Exam: General: NAD, thin, malnourished HEENT: +Laceration L side of the head, + erythema L eyelid and conjunctival injection.  Pupil reactive on the R, blunted on the Left.  EOM intact on the right, poor movement on the Left.  + 4x4 cm ecchymosis R occipital region.  MMM  Cardiovascular: Irregular irregular.  No murmurs appreciated  Respiratory: CTA, no wheezes, rales, rhonchi Abdomen: Soft, NT/ND, NABS, no suprapubic tenderness Extremities: +2 Pulses B/L LE, no edema Skin: Intact, no rashes, bruising to R lateral neck and arms, + laceration on Left parietal  region and left frontal region Neuro: Alert, Awake, Not Oriented.  CN 2-12 intact except for previous noted on HEENT exam.  MS 5/5 LE and UE, sensation intact  Labs and Imaging: CBC BMET   Lab 03/06/12 1319  WBC 5.3  HGB 10.3*  HCT 31.6*  PLT 167   Urine dipstick shows positive for WBC's and positive for leukocytes. Micro exam: TNTC WBC's per HPF, 0-2 RBC's per HPF and Many bacteria.    Lab 03/06/12 1319  NA 139  K 3.9  CL 101  CO2 28  BUN 27*   CREATININE 1.33  GLUCOSE 129*  CALCIUM 8.6     Gildardo Cranker, DO 03/06/2012, 4:39 PM  PGY-3 Addendum  Patient seen and examined by me along with Dr. Paulina Fusi.  Agree with above findings, assessment and plan with following revisions outlined in Red.  Everrett Coombe, DO

## 2012-03-06 NOTE — ED Notes (Addendum)
Pt reports to the ED from a SNF via [facility] for fall with laceration above left eye. Also bruising to the back of his head. Per EMS the SNF reports that they do not think he lost consciousness but they are not sure due to confusion at baseline. They do report increasing lethargy and falls over the past few weeks. Pt does know that he is in the hospital and his birthday but cannot name the month. Pt is on plavix, bleeding is controlled at this time. Pt has multiple contusions to his arms and torso. Pt is a poor historian due to hx of confusion and lethargy.

## 2012-03-06 NOTE — ED Provider Notes (Signed)
Medical screening examination/treatment/procedure(s) were conducted as a shared visit with non-physician practitioner(s) and myself.  I personally evaluated the patient during the encounter  Pt with multiple falls recently from memory care unit.  Level 5 caveat due to dementia.  Family is ok not performing another head CT, just had 2 in one day on March 01, 2012.  Pt with UTI, eye infection.  Will admit for treatment of UTI and altered mentation from baseline.   Gavin Pound. Karyssa Amaral, MD 03/06/12 1549

## 2012-03-07 DIAGNOSIS — R296 Repeated falls: Secondary | ICD-10-CM

## 2012-03-07 DIAGNOSIS — R4182 Altered mental status, unspecified: Secondary | ICD-10-CM | POA: Diagnosis present

## 2012-03-07 DIAGNOSIS — N390 Urinary tract infection, site not specified: Secondary | ICD-10-CM | POA: Diagnosis present

## 2012-03-07 DIAGNOSIS — S0181XA Laceration without foreign body of other part of head, initial encounter: Secondary | ICD-10-CM | POA: Diagnosis present

## 2012-03-07 LAB — CBC
HCT: 31.6 % — ABNORMAL LOW (ref 39.0–52.0)
MCH: 29.1 pg (ref 26.0–34.0)
MCHC: 32.9 g/dL (ref 30.0–36.0)
MCV: 88.3 fL (ref 78.0–100.0)
RDW: 14.7 % (ref 11.5–15.5)

## 2012-03-07 LAB — BASIC METABOLIC PANEL
BUN: 24 mg/dL — ABNORMAL HIGH (ref 6–23)
CO2: 26 mEq/L (ref 19–32)
Chloride: 105 mEq/L (ref 96–112)
Creatinine, Ser: 1.2 mg/dL (ref 0.50–1.35)
GFR calc Af Amer: 61 mL/min — ABNORMAL LOW (ref >90)

## 2012-03-07 LAB — GLUCOSE, CAPILLARY
Glucose-Capillary: 109 mg/dL — ABNORMAL HIGH (ref 70–99)
Glucose-Capillary: 52 mg/dL — ABNORMAL LOW (ref 70–99)
Glucose-Capillary: 58 mg/dL — ABNORMAL LOW (ref 70–99)

## 2012-03-07 MED ORDER — DEXTROSE-NACL 5-0.45 % IV SOLN
INTRAVENOUS | Status: DC
  Administered 2012-03-07: 10:00:00 via INTRAVENOUS

## 2012-03-07 MED ORDER — DEXTROSE 50 % IV SOLN
INTRAVENOUS | Status: AC
  Administered 2012-03-07: 25 mL
  Filled 2012-03-07: qty 50

## 2012-03-07 MED ORDER — CEPHALEXIN 500 MG PO CAPS: 500.0000 mg | ORAL_CAPSULE | Freq: Two times a day (BID) | ORAL | Status: DC

## 2012-03-07 NOTE — Clinical Social Work Psychosocial (Signed)
     Clinical Social Work Department BRIEF PSYCHOSOCIAL ASSESSMENT 03/07/2012  Patient:  [name] [name], [name] [name]     Account Number:  [id number]     Admit date:  03/06/2012  Clinical Social Worker:  Margaree Mackintosh  Date/Time:  03/07/2012 09:56 AM  Referred by:  Physician  Date Referred:  03/07/2012 Referred for  ALF Placement   Other Referral:   Interview type:  Family Other interview type:    PSYCHOSOCIAL DATA Living Status:  FACILITY Admitted from facility:  [facility] Level of care:  Assisted Living Primary support name:  Marylene Buerger: [phone] Primary support relationship to patient:  CHILD, ADULT Degree of support available:   HCPOA/Adequate.    CURRENT CONCERNS Current Concerns  Post-Acute Placement   Other Concerns:    SOCIAL WORK ASSESSMENT / PLAN Clinical Social Worker recieved referral from RN and MD indicating pt is from [facility] but there was concern that facility would not accept pt back.  CSW phoned [facility] and spoke with Victorino Dike who stated pt is welcome to return.  CSW spoke with pt's dtr who stated the plan is to return but she would like to know what options are available for pt.  Dtr is not open to SNF search stating, "Nursing Home dont prevent him from falling either".  Dtr expressed concern that pt "Has no strength in his legs". And would like to speak with MD re: if pt's falls are related to UTI or not.  Dtr is weighing options with moving pt to a new facility/surroundings, or returning to [place].  CSW updated RN.  CSW to speak with MD.  CSW to continue to follow and assist as needed.   Assessment/plan status:  Information/Referral to [facility] Other assessment/ plan:   Information/referral to community resources:   [facility]    PATIENTS/FAMILYS RESPONSE TO PLAN OF CARE: Pt currently unable to fully participate in assessment due to medical condition.  Dtr was engaged in conversation and expressed  concern for pt's safety.

## 2012-03-07 NOTE — Discharge Summary (Signed)
Physician Discharge Summary  Patient ID: DAXTYN ROTTENBERG MRN: [id number] DOB: 1922-09-10 Age: 78 y.o.  Admit date: 03/06/2012 Discharge date: 03/07/2012 Admitting Physician: Carney Living, MD  PCP: Florentina Jenny, MD  Consultants:None     Discharge Diagnosis: Principal Problem:  *Multiple falls Active Problems:  Altered mental status  Urinary tract infection  Forehead laceration    Hospital Course FUQUAN WILSON is a 78 y.o. year old male presenting with multiple falls and UTI   1. Frequent Falls secondary to severe dementia vs UTI - Pt previously seen in ED three times in the past 5 days and had multiple CT scans performed. Per daughter, pt is DNR and even if he had a hemorrhagic stroke, she would not want surgery. Pt with severe dementia and POA is his daughter. A repeat CT was not done at this visit due to daughter and treatment goals.  Pt was transferred to the floor and placed with OOB with assistance and had PT/OT/CSW evaluate him.  PT/OT recommended patient go to SNF and CSW discussed with pt's daughter (his POA) who did not want to send him to a SNF but rather back to [place].   2. UTI -Pt had UA done on admission and showed TNTC WBC on urine micro with many bacteria seen. He was afebrile and did not have a leukocytosis during his hospital stay. Pt was given a dose or Rocephin in the ED, a UCx was pending at d/c, and he received one total dose or Rocephin to cover his UTI.  He was sent home to complete a 7 day course of Keflex 500 mg BID.  3. Atrial Fibrillation - Pt was in Afib rate controlled upon presentation to the ED.  Stable during his hospital stay and continued on home medications.  A dig level was 0.7. 4. DM II, controlled - Pt was not continued on his metformin during his hospital stay pending possible contrast containing study.  He was placed on SSI and he had a few episodes where he became hypoglycemic to the low 40s, high 30s.  He was switched to D5 1/2 NS at  this point and continued to monitor his CBG.  Stable prior to d/c.  5. GERD - Stable, continued home meds. 6. Chronic Systolic CHF - Stable, continued home meds. 7. Blepharitis - Stable, Continue home meds        Discharge PE   Filed Vitals:   03/07/12 1200  BP: 149/76  Pulse: 66  Temp: 97.5 F (36.4 C)  Resp: 17   General: NAD, thin, malnourished  HEENT: +Laceration L side of the head, + erythema L eyelid and conjunctival injection.EOM intact on the right, poor movement on the Left. + 4x4 cm ecchymosis R occipital region. MMM  Cardiovascular: Irregular irregular. No murmurs appreciated  Respiratory: CTA, no wheezes, rales, rhonchi  Abdomen: Soft, NT/ND, NABS, no suprapubic tenderness  Extremities: +2 Pulses B/L LE, no edema  Neuro: Alert, Awake, Not Oriented.       Procedures/Imaging:  Dg Chest 2 View  03/01/2012 IMPRESSION:  1.  Small bilateral pleural effusions. 2.  Stable cardiomegaly with dual lead pacer.   Original Report Authenticated By: Britta Mccreedy, M.D.    Ct Head Wo Contrast  03/01/2012    IMPRESSION:  1.  No acute findings. 2.  Advanced cervical spondylosis.   Original Report Authenticated By: Myles Rosenthal, M.D.    Ct Head Wo Contrast  03/01/2012    Impression: 1.  No acute fracture or subluxation.  Stable multilevel degenerative changes as described above.   Original Report Authenticated By: Natasha Mead, M.D.    Ct Cervical Spine Wo Contrast  03/01/2012  .  IMPRESSION:  1.  No acute intracranial findings. 2.  Stable cerebral atrophy, chronic small vessel disease, and old left lacunar infarct.  CT CERVICAL SPINE  Findings: No evidence of acute lumbar spine fracture or subluxation.  Severe degenerative disc disease is seen at levels of C2-3, C3-4, and C5-6.  Mild facet DJD is seen bilaterally, most pronounced at the C7-T1, where there is mild grade 1 degenerative anterolisthesis.  Generalized osteopenia noted.  Atlantoaxial degenerative changes also demonstrated.  No focal  lytic or sclerotic bone lesions identified.  IMPRESSION:  1.  No acute findings. 2.  Advanced cervical spondylosis.   Original Report Authenticated By: Myles Rosenthal, M.D.    Ct Cervical Spine Wo Contrast  03/01/2012    IMPRESSION: No acute intracranial abnormality.  Stable atrophy and chronic white matter disease.  Stable lacunar infarct in the left basal ganglia.  CT cervical spine without IV contrast:  Comparison exam 06/28/2011  Findings:  Axial images of the cervical spine shows no acute fracture or subluxation.  Stable degenerative changes C1-C2 articulation. Computer processed images shows no acute fracture or subluxation. Again noted mild anterolisthesis C4 and C5 vertebral body.  Stable disc space flattening at C2-C3 level.  Stable disc space flattening with mild anterior spurring at C3-C4 level and C5-C6 level.  Mild anterior spurring noted at C6-C7 level.  Mild posterior spurring noted at C5-C6 level.  No prevertebral soft tissue swelling. Cervical airway is patent.  There is no pneumothorax in visualized lung apices.  Emphysematous changes are noted bilateral lung apices.  Coronal images shows no acute fracture or subluxation.  Impression: 1.  No acute fracture or subluxation.  Stable multilevel degenerative changes as described above.   Original Report Authenticated By: Natasha Mead, M.D.     Labs  CBC  Lab 03/07/12 0614 03/06/12 2010 03/06/12 1319  WBC 6.9 5.3 5.3  HGB 10.4* 10.2* 10.3*  HCT 31.6* 31.5* 31.6*  PLT 185 167 167   BMET  Lab 03/07/12 0614 03/06/12 2010 03/06/12 1319 03/01/12 2052 03/01/12 0824  NA 142 -- 139 -- 141  K 3.9 -- 3.9 -- 3.4*  CL 105 -- 101 -- 100  CO2 26 -- 28 -- --  BUN 24* -- 27* -- 22  CREATININE 1.20 1.25 1.33 -- --  CALCIUM 8.9 -- 8.6 -- --  PROT -- -- 6.6 7.3 --  BILITOT -- -- 1.0 1.6* --  ALKPHOS -- -- 79 77 --  ALT -- -- 16 13 --  AST -- -- 18 19 --  GLUCOSE 110* -- 129* -- 98        Patient condition at time of discharge/disposition:  stable  Disposition-ALF ([facility])    Follow up issues: 1. Consider having cardiology check pt's pacemaker (Last in 2013 May) 2. Consider stopping metformin (Pt had multiple hypoglycemic episodes while on thin SSI in the hospital)  3. Consider repeating UA   Discharge follow up:    Follow-up Information    Follow up with Florentina Jenny, MD. (F/U at ALF (Please make appointment to either see Dr. Redmond School or for him to see you))    Contact information:   [address] [place] [address] [phone]            Discharge Instructions: Please refer to Patient Instructions section of EMR for  full details.  Patient was counseled important signs and symptoms that should prompt return to medical care, changes in medications, dietary instructions, activity restrictions, and follow up appointments.  Significant instructions noted below:    Discharge Medications   Medication List     As of 03/07/2012  3:34 PM    START taking these medications         cephALEXin 500 MG capsule   Commonly known as: KEFLEX   Take 1 capsule (500 mg total) by mouth 2 (two) times daily.      CONTINUE taking these medications         acetaminophen 500 MG tablet   Commonly known as: TYLENOL      atorvastatin 10 MG tablet   Commonly known as: LIPITOR      brimonidine 0.15 % ophthalmic solution   Commonly known as: ALPHAGAN      clopidogrel 75 MG tablet   Commonly known as: PLAVIX      dextromethorphan 30 MG/5ML liquid   Commonly known as: DELSYM      dextrose 40 % Gel   Commonly known as: GLUTOSE      digoxin 0.25 MG tablet   Commonly known as: LANOXIN      ferrous sulfate 325 (65 FE) MG tablet      fludrocortisone 0.1 MG tablet   Commonly known as: FLORINEF      furosemide 20 MG tablet   Commonly known as: LASIX      latanoprost 0.005 % ophthalmic solution   Commonly known as: XALATAN      LORazepam 0.5 MG tablet   Commonly known as: ATIVAN      metFORMIN 500 MG  tablet   Commonly known as: GLUCOPHAGE      metoprolol 50 MG tablet   Commonly known as: LOPRESSOR      omeprazole 40 MG capsule   Commonly known as: PRILOSEC      potassium chloride SA 20 MEQ tablet   Commonly known as: K-DUR,KLOR-CON      REFRESH LACRI-LUBE Oint      SYSTANE 0.4-0.3 % Soln   Generic drug: Polyethyl Glycol-Propyl Glycol      Travoprost (BAK Free) 0.004 % Soln ophthalmic solution   Commonly known as: TRAVATAN      venlafaxine XR 150 MG 24 hr capsule   Commonly known as: EFFEXOR-XR          Where to get your medications    These are the prescriptions that you need to pick up.   You may get these medications from any pharmacy.         cephALEXin 500 MG capsule                Gildardo Cranker, DO of [facility] 03/07/2012 11:53 AM

## 2012-03-07 NOTE — Evaluation (Signed)
Physical Therapy Evaluation Patient Details Name: [name] [name] MRN: [id number] DOB: 1922/05/28 Today's Date: 03/07/2012 Time: [phone] (extensive talk with daughter) PT Time Calculation (min): 45 min  PT Assessment / Plan / Recommendation Clinical Impression  pt admitted with report of multiple falls recently.  Pt can benefit from PT on acute and post acute for general weakness, decr coordination and balance. to help improve his safety and decr burden of care.    PT Assessment  Patient needs continued PT services    Follow Up Recommendations  Home health PT;Supervision for mobility/OOB (at [facility] or Change to SNF for rehab no return to [facility])    Does the patient have the potential to tolerate intense rehabilitation      Barriers to Discharge        Equipment Recommendations  None recommended by PT    Recommendations for Other Services     Frequency Min 3X/week    Precautions / Restrictions Precautions Precautions: Fall   Pertinent Vitals/Pain       Mobility  Bed Mobility Bed Mobility: Supine to Sit;Sitting - Scoot to [place] of Bed;Sit to Supine Supine to Sit: 4: Min assist Sitting - Scoot to [place] of Bed: 4: Min guard Sit to Supine: 4: Min guard Details for Bed Mobility Assistance: cues for direction; truncal assist Transfers Transfers: Sit to Stand;Stand to Sit Sit to Stand: 4: Min assist;With upper extremity assist;From toilet;From bed Stand to Sit: 4: Min assist;With upper extremity assist;To bed;To toilet Details for Transfer Assistance: vc's for hand placement/ safety issues; assist to help pt stay forward as he tends to list posteriorly Ambulation/Gait Ambulation/Gait Assistance: 4: Min assist Ambulation Distance (Feet): 140 Feet Assistive device: Rolling walker Ambulation/Gait Assistance Details: generally steady gait, but lists off posteriorly and to the R frequently needing balance assist Gait Pattern: Step-through pattern;Decreased step length -  right;Decreased step length - left;Decreased stride length Stairs: No Wheelchair Mobility Wheelchair Mobility: No    Shoulder Instructions     Exercises     PT Diagnosis:    PT Problem List: Decreased strength;Decreased activity tolerance;Decreased balance;Decreased mobility;Decreased coordination;Decreased knowledge of use of DME PT Treatment Interventions: DME instruction;Gait training;Functional mobility training;Therapeutic activities;Balance training;Patient/family education   PT Goals Acute Rehab PT Goals PT Goal Formulation: Patient unable to participate in goal setting Time For Goal Achievement: 03/07/12 Potential to Achieve Goals: Good Pt will go Supine/Side to Sit: with supervision;with HOB 0 degrees;Other (comment) (with safe technique) PT Goal: Supine/Side to Sit - Progress: Goal set today Pt will go Sit to Stand: with supervision;Other (comment) (safe technique) PT Goal: Sit to Stand - Progress: Goal set today Pt will Transfer Bed to Chair/Chair to Bed: with supervision PT Transfer Goal: Bed to Chair/Chair to Bed - Progress: Goal set today Pt will Ambulate: 51 - 150 feet;with supervision;with rolling walker;Other (comment) (safe technique) PT Goal: Ambulate - Progress: Goal set today  Visit Information  Last PT Received On: 03/07/12 Assistance Needed: +1    Subjective Data  Subjective: Nope, I don't hurt. Patient Stated Goal: pt unable to participate   Prior Functioning  Home Living Available Help at Discharge: Personal care attendant;Other (Comment) (Assisted Living assistance) Type of Home: Assisted living Home Layout: One level Prior Function Level of Independence: Needs assistance Communication Communication: No difficulties    Cognition  Overall Cognitive Status: Impaired Arousal/Alertness: Awake/alert Orientation Level: Disoriented to;Place;Time;Situation Behavior During Session: [facility] for tasks performed    Extremity/Trunk Assessment Right Upper  Extremity Assessment RUE ROM/Strength/Tone: [facility]  for tasks assessed;Deficits Right Lower Extremity Assessment RLE ROM/Strength/Tone: [facility] for tasks assessed;Deficits RLE ROM/Strength/Tone Deficits: grossly weak at 4-/5 Left Lower Extremity Assessment LLE ROM/Strength/Tone: [facility] for tasks assessed;Deficits LLE ROM/Strength/Tone Deficits: grossly weak at 4-/5   Balance Balance Balance Assessed: Yes Static Sitting Balance Static Sitting - Balance Support: Feet supported;No upper extremity supported Static Sitting - Level of Assistance: 5: Stand by assistance;7: Independent Static Sitting - Comment/# of Minutes: 7 minutes while bandaging his arm Static Standing Balance Static Standing - Balance Support: During functional activity;Bilateral upper extremity supported;Left upper extremity supported;Right upper extremity supported Static Standing - Level of Assistance: 4: Min assist  End of Session PT - End of Session Activity Tolerance: Patient tolerated treatment well Patient left: in bed;with call bell/phone within reach;with bed alarm set Nurse Communication: Mobility status  GP     [name], [name] 03/07/2012, 12:42 PM  03/07/2012  [name], PT [phone] (pager)

## 2012-03-07 NOTE — H&P (Signed)
Family Medicine Teaching Service Attending Note  On 12/10 I interviewed and examined patient [name] [name] and reviewed their tests and x-rays.  I discussed with Dr. Ashley Royalty and reviewed their note for today.  I agree with their assessment and plan.     Additionally  No specific cause for increased fall.  Likely mutifactorial including UTI and worsening dementia Physical Therapy to evaluate to ensure proper walker use and transfering - unsure if he is cognitively able to do that

## 2012-03-07 NOTE — Progress Notes (Signed)
PGY-1 Daily Progress Note Family Medicine Teaching Service [place] R. Param Capri, DO Service Pager: [phone]   Subjective: Pt daughter not in room this AM.  Pt comfortable and not c/o CP, SOB, dizziness  Objective:  VITALS Temp:  [97.2 F (36.2 C)-98.2 F (36.8 C)] 98 F (36.7 C) (12/11 0600) Pulse Rate:  [62-120] 108  (12/11 0600) Resp:  [10-24] 18  (12/11 0600) BP: (121-164)/(72-99) 162/99 mmHg (12/11 0600) SpO2:  [95 %-100 %] 100 % (12/11 0600) Weight:  [124 lb 11.2 oz (56.564 kg)-125 lb 8 oz (56.926 kg)] 124 lb 11.2 oz (56.564 kg) (12/11 0600)  In/Out  Intake/Output Summary (Last 24 hours) at 03/07/12 0753 Last data filed at 03/07/12 0500  Gross per 24 hour  Intake     53 ml  Output    500 ml  Net   -447 ml   UOP since admission - 0.7 ml/kg/hr Wt -  Filed Weights   03/06/12 1820 03/07/12 0600  Weight: 125 lb 8 oz (56.926 kg) 124 lb 11.2 oz (56.564 kg)    Physical Exam: General: NAD, thin, malnourished  HEENT: +Laceration L side of the head, + erythema L eyelid and conjunctival injection.EOM intact on the right, poor movement on the Left. + 4x4 cm ecchymosis R occipital region. MMM  Cardiovascular: Irregular irregular. No murmurs appreciated  Respiratory: CTA, no wheezes, rales, rhonchi  Abdomen: Soft, NT/ND, NABS, no suprapubic tenderness  Extremities: +2 Pulses B/L LE, no edema  Neuro: Alert, Awake, Not Oriented.    MEDS Scheduled Meds:   . [COMPLETED] acetaminophen  650 mg Oral Once  . atorvastatin  10 mg Oral Daily  . brimonidine  1 drop Right Eye BID  . cefTRIAXone (ROCEPHIN)  IV  1 g Intravenous Q24H  . clopidogrel  75 mg Oral QAC breakfast  . digoxin  250 mcg Oral Daily  . ferrous sulfate  325 mg Oral Q breakfast  . fludrocortisone  0.1 mg Oral BID  . furosemide  20 mg Oral Daily  . insulin aspart  0-9 Units Subcutaneous TID WC  . latanoprost  1 drop Both Eyes QHS  . metoprolol  50 mg Oral BID  . pantoprazole  40 mg Oral Daily  . polyvinyl alcohol  1  drop Both Eyes 6 X Daily  . potassium chloride SA  20 mEq Oral Daily  . sodium chloride  3 mL Intravenous Q12H  . sodium chloride  3 mL Intravenous Q12H  . Travoprost (BAK Free)  1 drop Both Eyes QHS  . venlafaxine XR  150 mg Oral Daily  . [DISCONTINUED] brimonidine  1 drop Right Eye BID  . [DISCONTINUED] Polyethyl Glycol-Propyl Glycol  1 drop Ophthalmic 6 X Daily   Continuous Infusions:  PRN Meds:.sodium chloride, acetaminophen, acetaminophen, artificial tears, dextromethorphan, dextrose, sodium chloride, [DISCONTINUED] REFRESH LACRI-LUBE  Labs and imaging:   CBC  Lab 03/07/12 0614 03/06/12 2010 03/06/12 1319  WBC 6.9 5.3 5.3  HGB 10.4* 10.2* 10.3*  HCT 31.6* 31.5* 31.6*  PLT 185 167 167   BMET/CMET  Lab 03/07/12 0614 03/06/12 2010 03/06/12 1319 03/01/12 2052 03/01/12 0824  NA 142 -- 139 -- 141  K 3.9 -- 3.9 -- 3.4*  CL 105 -- 101 -- 100  CO2 26 -- 28 -- --  BUN 24* -- 27* -- 22  CREATININE 1.20 1.25 1.33 -- --  CALCIUM 8.9 -- 8.6 -- --  PROT -- -- 6.6 7.3 --  BILITOT -- -- 1.0 1.6* --  ALKPHOS -- -- 79  77 --  ALT -- -- 16 13 --  AST -- -- 18 19 --  GLUCOSE 110* -- 129* -- 98   Results for orders placed during the hospital encounter of 03/06/12 (from the past 24 hour(s))  GLUCOSE, CAPILLARY     Status: Abnormal   Collection Time   03/06/12  1:01 PM      Component Value Range   Glucose-Capillary 112 (*) 70 - 99 mg/dL   Comment 1 Documented in Chart     Comment 2 Notify RN    CBC WITH DIFFERENTIAL     Status: Abnormal   Collection Time   03/06/12  1:19 PM      Component Value Range   WBC 5.3  4.0 - 10.5 K/uL   RBC 3.56 (*) 4.22 - 5.81 MIL/uL   Hemoglobin 10.3 (*) 13.0 - 17.0 g/dL   HCT 16.1 (*) 09.6 - 04.5 %   MCV 88.8  78.0 - 100.0 fL   MCH 28.9  26.0 - 34.0 pg   MCHC 32.6  30.0 - 36.0 g/dL   RDW 40.9  81.1 - 91.4 %   Platelets 167  150 - 400 K/uL   Neutrophils Relative 70  43 - 77 %   Neutro Abs 3.7  1.7 - 7.7 K/uL   Lymphocytes Relative 13  12 - 46 %    Lymphs Abs 0.7  0.7 - 4.0 K/uL   Monocytes Relative 15 (*) 3 - 12 %   Monocytes Absolute 0.8  0.1 - 1.0 K/uL   Eosinophils Relative 3  0 - 5 %   Eosinophils Absolute 0.1  0.0 - 0.7 K/uL   Basophils Relative 0  0 - 1 %   Basophils Absolute 0.0  0.0 - 0.1 K/uL  COMPREHENSIVE METABOLIC PANEL     Status: Abnormal   Collection Time   03/06/12  1:19 PM      Component Value Range   Sodium 139  135 - 145 mEq/L   Potassium 3.9  3.5 - 5.1 mEq/L   Chloride 101  96 - 112 mEq/L   CO2 28  19 - 32 mEq/L   Glucose, Bld 129 (*) 70 - 99 mg/dL   BUN 27 (*) 6 - 23 mg/dL   Creatinine, Ser 7.82  0.50 - 1.35 mg/dL   Calcium 8.6  8.4 - 95.6 mg/dL   Total Protein 6.6  6.0 - 8.3 g/dL   Albumin 2.8 (*) 3.5 - 5.2 g/dL   AST 18  0 - 37 U/L   ALT 16  0 - 53 U/L   Alkaline Phosphatase 79  39 - 117 U/L   Total Bilirubin 1.0  0.3 - 1.2 mg/dL   GFR calc non Af Amer 46 (*) >90 mL/min   GFR calc Af Amer 54 (*) >90 mL/min  URINALYSIS, ROUTINE W REFLEX MICROSCOPIC     Status: Abnormal   Collection Time   03/06/12  1:56 PM      Component Value Range   Color, Urine YELLOW  YELLOW   APPearance CLOUDY (*) CLEAR   Specific Gravity, Urine 1.018  1.005 - 1.030   pH 6.5  5.0 - 8.0   Glucose, UA NEGATIVE  NEGATIVE mg/dL   Hgb urine dipstick SMALL (*) NEGATIVE   Bilirubin Urine NEGATIVE  NEGATIVE   Ketones, ur NEGATIVE  NEGATIVE mg/dL   Protein, ur 30 (*) NEGATIVE mg/dL   Urobilinogen, UA 4.0 (*) 0.0 - 1.0 mg/dL   Nitrite NEGATIVE  NEGATIVE  Leukocytes, UA LARGE (*) NEGATIVE  URINE MICROSCOPIC-ADD ON     Status: Abnormal   Collection Time   03/06/12  1:56 PM      Component Value Range   Squamous Epithelial / LPF FEW (*) RARE   WBC, UA TOO NUMEROUS TO COUNT  <3 WBC/hpf   RBC / HPF 0-2  <3 RBC/hpf   Bacteria, UA MANY (*) RARE  DIGOXIN LEVEL     Status: Abnormal   Collection Time   03/06/12  3:01 PM      Component Value Range   Digoxin Level 0.7 (*) 0.8 - 2.0 ng/mL  CBC     Status: Abnormal   Collection Time    03/06/12  8:10 PM      Component Value Range   WBC 5.3  4.0 - 10.5 K/uL   RBC 3.56 (*) 4.22 - 5.81 MIL/uL   Hemoglobin 10.2 (*) 13.0 - 17.0 g/dL   HCT 38.7 (*) 56.4 - 33.2 %   MCV 88.5  78.0 - 100.0 fL   MCH 28.7  26.0 - 34.0 pg   MCHC 32.4  30.0 - 36.0 g/dL   RDW 95.1  88.4 - 16.6 %   Platelets 167  150 - 400 K/uL  CREATININE, SERUM     Status: Abnormal   Collection Time   03/06/12  8:10 PM      Component Value Range   Creatinine, Ser 1.25  0.50 - 1.35 mg/dL   GFR calc non Af Amer 50 (*) >90 mL/min   GFR calc Af Amer 58 (*) >90 mL/min  GLUCOSE, CAPILLARY     Status: Abnormal   Collection Time   03/06/12 10:14 PM      Component Value Range   Glucose-Capillary 100 (*) 70 - 99 mg/dL   Comment 1 Notify RN    BASIC METABOLIC PANEL     Status: Abnormal   Collection Time   03/07/12  6:14 AM      Component Value Range   Sodium 142  135 - 145 mEq/L   Potassium 3.9  3.5 - 5.1 mEq/L   Chloride 105  96 - 112 mEq/L   CO2 26  19 - 32 mEq/L   Glucose, Bld 110 (*) 70 - 99 mg/dL   BUN 24 (*) 6 - 23 mg/dL   Creatinine, Ser 0.63  0.50 - 1.35 mg/dL   Calcium 8.9  8.4 - 01.6 mg/dL   GFR calc non Af Amer 53 (*) >90 mL/min   GFR calc Af Amer 61 (*) >90 mL/min  CBC     Status: Abnormal   Collection Time   03/07/12  6:14 AM      Component Value Range   WBC 6.9  4.0 - 10.5 K/uL   RBC 3.58 (*) 4.22 - 5.81 MIL/uL   Hemoglobin 10.4 (*) 13.0 - 17.0 g/dL   HCT 01.0 (*) 93.2 - 35.5 %   MCV 88.3  78.0 - 100.0 fL   MCH 29.1  26.0 - 34.0 pg   MCHC 32.9  30.0 - 36.0 g/dL   RDW 73.2  20.2 - 54.2 %   Platelets 185  150 - 400 K/uL    Assessment  Assessment and Plan:  VONDELL BABERS is a 78 y.o. year old male presenting with multiple falls and UTI  1. Frequent Falls with increased fall risk - May be secondary to end stage dementia vs UTI  1. After discussion with daughter, no CT scan at this time.  No Anticoagulation due to fall risk.   2. Consider Cardiology consult for pacer check.  3. Will  get Orthostatics on pt as he does have history of being on Florinef (unknown why). Will also place on Telemetry and have neuro checks with vitals. F/U EKG in AM  4. CT scans previously do not show NPH. Pt does not have incontinence at this time. 5. Continue holding sedating meds.  6. OOB with assistance. Bed alarm monitors in place. 7. PT/OT/CSW consult for possible placement. May need to go to SNF for further management.  2. UTI - TNTC WBC on urine micro with many bacteria seen.  1. Afebrile, daughter denies recent fever as well.  2. No leukocytosis on repeat CBC this AM 3. F/U UCx 4. Rocephin q 24 hrs  3. Atrial Fibrillation - Will place on monitor. Rate controlled. Consider consulting cardiology for pacer check  1. Continue home meds  4. DM II - Holding Metformin.  Consider D/C upon d/c as he is 40 and do not want to give him Lactic Acidosis  1. SSI with CBG checks.  Asymptomatic episode of 39 this AM.  Changed to D5 50 cc/hr to increase UOP and increase blood glucose as pt has had poor oral intake.  5. GERD - Protonix 40 mg qd 6. Chronic Systolic CHF - Pt does not appear to be fluid overloaded at this time  1. Continue home Lasix 2. Monitor I/O and weights  7. Blepharitis - Continue home meds 8. FEN/GI: Heart Healthy Diet. Monitor UOP for fluid status. Protonix. D5 1/2 NS @ 50 cc/hr.  9. Prophylaxis: SCD, no VTE PPx otherwise due to fall risk 10. Disposition: Pending PT/OT evaluation and observation for further events 11. Code Status: DNR  Twana First. Paulina Fusi, DO of [facility] 03/07/2012, 9:39 AM

## 2012-03-07 NOTE — Progress Notes (Signed)
Family Medicine Teaching Service Attending Note  I interviewed and examined patient [name] [name] and reviewed their tests and x-rays.  I discussed with Dr. Paulina Fusi and reviewed their note for today.  I agree with their assessment and plan.     Additionally  Await Physical Therapy evaluation

## 2012-03-07 NOTE — Progress Notes (Signed)
Clinical Social Worker received notification from MD that pt is to dc back to [facility] today.  CSW spoke with Roderic Scarce at [facility] who stated she wished for paperwork to come with pt.  [facility] not able to provide transportation; dtr wishes for ambulance to be called.  CSW updated RN and arranged transport.  CSW to sign off at dc.  Please re consult if needed.   Angelia Mould, MSW, [place] [phone]

## 2012-03-07 NOTE — Evaluation (Signed)
Occupational Therapy Evaluation Patient Details Name: [name] [name] MRN: [id number] DOB: May 26, 1922 Today's Date: 03/07/2012 Time: 2130-8657 OT Time Calculation (min): 16 min  OT Assessment / Plan / Recommendation Clinical Impression  [name] [name] is a 78 y.o. year old male presenting with multiple falls and UTI. Pt with advanced dementia per chart. Pt presents as generally weak and with poor activity tolerance. Pt will benefit from skilled OT in the acute setting to maximize I with ADL and ADL mobility prior to d/c. Note that pt is from ALF memory care unit. If ALF able to provide increased supervision, Min A with ambulation and up to Mod/Max assist with ADLs, recommend pt return to ALF. If not, recommend SNF.     OT Assessment  Patient needs continued OT Services    Follow Up Recommendations  SNF;Home health OT    Barriers to Discharge      Equipment Recommendations  None recommended by OT    Recommendations for Other Services    Frequency  Min 2X/week    Precautions / Restrictions Precautions Precautions: Fall   Pertinent Vitals/Pain Pt reports "upset stomach" but denies need to use the bathroom    ADL  Grooming: Wash/dry hands;Minimal assistance Where Assessed - Grooming: Supported standing Upper Body Dressing: Moderate assistance Where Assessed - Upper Body Dressing: Unsupported sitting Lower Body Dressing: +1 Total assistance Where Assessed - Lower Body Dressing: Supported sit to stand Toilet Transfer: Minimal assistance Toilet Transfer Method: Sit to Barista:  (bed) Toileting - Clothing Manipulation and Hygiene: Maximal assistance Where Assessed - Engineer, mining and Hygiene: Sit to stand from 3-in-1 or toilet;Standing Equipment Used: Gait belt;Rolling walker Transfers/Ambulation Related to ADLs: Min guard A with RW ambulation; wide turns ADL Comments: pt nearly nonverbal throughout session. no family available    OT  Diagnosis: Generalized weakness;Acute pain  OT Problem List: Decreased strength;Decreased activity tolerance;Impaired balance (sitting and/or standing);Decreased knowledge of use of DME or AE;Decreased knowledge of precautions;Pain OT Treatment Interventions: Self-care/ADL training;Therapeutic activities;Patient/family education;Balance training   OT Goals Acute Rehab OT Goals OT Goal Formulation: With patient Time For Goal Achievement: 03/21/12 Potential to Achieve Goals: Good ADL Goals Pt Will Perform Grooming: with supervision;Standing at sink ADL Goal: Grooming - Progress: Goal set today Pt Will Perform Upper Body Dressing: with set-up;Sitting, bed;Sitting, chair ADL Goal: Upper Body Dressing - Progress: Goal set today Pt Will Perform Lower Body Dressing: with mod assist ADL Goal: Lower Body Dressing - Progress: Goal set today Pt Will Transfer to Toilet: with supervision;Ambulation;with DME ADL Goal: Toilet Transfer - Progress: Goal set today Pt Will Perform Toileting - Clothing Manipulation: with supervision;Standing ADL Goal: Toileting - Clothing Manipulation - Progress: Goal set today Pt Will Perform Toileting - Hygiene: Independently;Sitting on 3-in-1 or toilet ADL Goal: Toileting - Hygiene - Progress: Goal set today  Visit Information  Last OT Received On: 03/07/12 Assistance Needed: +1    Subjective Data  Subjective: "My stomach doesn't feel too good" Patient Stated Goal: none stated   Prior Functioning     Home Living Available Help at Discharge: Personal care attendant;Other (Comment) (Assisted Living assistance) Type of Home: Assisted living Home Layout: One level Prior Function Level of Independence: Needs assistance Communication Communication: No difficulties         Vision/Perception     Cognition  Overall Cognitive Status: Impaired Arousal/Alertness: Awake/alert Orientation Level: Disoriented to;Place;Time;Situation Behavior During Session: [facility]  for tasks performed    Extremity/Trunk Assessment Right Upper Extremity Assessment  RUE ROM/Strength/Tone: Deficits RUE ROM/Strength/Tone Deficits: generally weak (grossly 4/5) Left Upper Extremity Assessment LUE ROM/Strength/Tone: Deficits LUE ROM/Strength/Tone Deficits: generally weak (grossly 4/5) Right Lower Extremity Assessment RLE ROM/Strength/Tone: WFL for tasks assessed;Deficits RLE ROM/Strength/Tone Deficits: grossly weak at 4-/5 Left Lower Extremity Assessment LLE ROM/Strength/Tone: [facility] for tasks assessed;Deficits LLE ROM/Strength/Tone Deficits: grossly weak at 4-/5     Mobility Bed Mobility Bed Mobility: Supine to Sit;Sitting - Scoot to [place] of Bed;Sit to Supine Supine to Sit: 4: Min assist Sitting - Scoot to [place] of Bed: 4: Min guard Sit to Supine: 4: Min guard Details for Bed Mobility Assistance: cues for direction; truncal assist Transfers Sit to Stand: 4: Min assist;With upper extremity assist;From toilet;From bed Stand to Sit: 4: Min assist;With upper extremity assist;To bed;To toilet Details for Transfer Assistance: vc's for hand placement/ safety issues; assist to help pt stay forward as he tends to list posteriorly     Shoulder Instructions     Exercise     Balance Balance Balance Assessed: Yes Static Sitting Balance Static Sitting - Balance Support: Feet supported;No upper extremity supported Static Sitting - Level of Assistance: 5: Stand by assistance;7: Independent Static Sitting - Comment/# of Minutes:  Static Standing Balance Static Standing - Balance Support: During functional activity;Bilateral upper extremity supported;Left upper extremity supported;Right upper extremity supported Static Standing - Level of Assistance: 4: Min assist   End of Session OT - End of Session Equipment Utilized During Treatment: Gait belt Activity Tolerance: Patient tolerated treatment well Patient left: in bed;with call bell/phone within reach;with bed alarm  set;with nursing in room Nurse Communication: Mobility status  GO     [name] [name] 03/07/2012, 3:44 PM

## 2012-03-08 LAB — URINE CULTURE: Colony Count: >=100000

## 2012-03-08 NOTE — Discharge Summary (Signed)
I have reviewed this discharge summary and agree.    

## 2012-04-10 ENCOUNTER — Emergency Department (HOSPITAL_COMMUNITY)
Admission: EM | Admit: 2012-04-10 | Discharge: 2012-04-10 | Disposition: A | Discharge status: Home / Self Care | Payer: MEDICARE | Attending: Emergency Medicine | Admitting: Emergency Medicine

## 2012-04-10 ENCOUNTER — Encounter (HOSPITAL_COMMUNITY): Payer: Self-pay | Admitting: Family Medicine

## 2012-04-10 ENCOUNTER — Emergency Department (HOSPITAL_COMMUNITY): Payer: MEDICARE

## 2012-04-10 DIAGNOSIS — S0990XA Unspecified injury of head, initial encounter: Secondary | ICD-10-CM

## 2012-04-10 DIAGNOSIS — I509 Heart failure, unspecified: Secondary | ICD-10-CM | POA: Insufficient documentation

## 2012-04-10 DIAGNOSIS — I252 Old myocardial infarction: Secondary | ICD-10-CM | POA: Insufficient documentation

## 2012-04-10 DIAGNOSIS — I2510 Atherosclerotic heart disease of native coronary artery without angina pectoris: Secondary | ICD-10-CM | POA: Insufficient documentation

## 2012-04-10 DIAGNOSIS — Z87448 Personal history of other diseases of urinary system: Secondary | ICD-10-CM | POA: Insufficient documentation

## 2012-04-10 DIAGNOSIS — S51009A Unspecified open wound of unspecified elbow, initial encounter: Secondary | ICD-10-CM | POA: Insufficient documentation

## 2012-04-10 DIAGNOSIS — H109 Unspecified conjunctivitis: Secondary | ICD-10-CM

## 2012-04-10 DIAGNOSIS — Z950 Presence of cardiac pacemaker: Secondary | ICD-10-CM | POA: Insufficient documentation

## 2012-04-10 DIAGNOSIS — F3289 Other specified depressive episodes: Secondary | ICD-10-CM | POA: Insufficient documentation

## 2012-04-10 DIAGNOSIS — Z87891 Personal history of nicotine dependence: Secondary | ICD-10-CM | POA: Insufficient documentation

## 2012-04-10 DIAGNOSIS — R296 Repeated falls: Secondary | ICD-10-CM | POA: Insufficient documentation

## 2012-04-10 DIAGNOSIS — S01309A Unspecified open wound of unspecified ear, initial encounter: Secondary | ICD-10-CM | POA: Insufficient documentation

## 2012-04-10 DIAGNOSIS — E119 Type 2 diabetes mellitus without complications: Secondary | ICD-10-CM | POA: Insufficient documentation

## 2012-04-10 DIAGNOSIS — Z8669 Personal history of other diseases of the nervous system and sense organs: Secondary | ICD-10-CM | POA: Insufficient documentation

## 2012-04-10 DIAGNOSIS — Z8719 Personal history of other diseases of the digestive system: Secondary | ICD-10-CM | POA: Insufficient documentation

## 2012-04-10 DIAGNOSIS — Z8679 Personal history of other diseases of the circulatory system: Secondary | ICD-10-CM | POA: Insufficient documentation

## 2012-04-10 DIAGNOSIS — Y939 Activity, unspecified: Secondary | ICD-10-CM | POA: Insufficient documentation

## 2012-04-10 DIAGNOSIS — W19XXXA Unspecified fall, initial encounter: Secondary | ICD-10-CM

## 2012-04-10 DIAGNOSIS — Z79899 Other long term (current) drug therapy: Secondary | ICD-10-CM | POA: Insufficient documentation

## 2012-04-10 DIAGNOSIS — Z23 Encounter for immunization: Secondary | ICD-10-CM | POA: Insufficient documentation

## 2012-04-10 DIAGNOSIS — H00039 Abscess of eyelid unspecified eye, unspecified eyelid: Secondary | ICD-10-CM | POA: Insufficient documentation

## 2012-04-10 DIAGNOSIS — IMO0002 Reserved for concepts with insufficient information to code with codable children: Secondary | ICD-10-CM

## 2012-04-10 DIAGNOSIS — F329 Major depressive disorder, single episode, unspecified: Secondary | ICD-10-CM | POA: Insufficient documentation

## 2012-04-10 DIAGNOSIS — E785 Hyperlipidemia, unspecified: Secondary | ICD-10-CM | POA: Insufficient documentation

## 2012-04-10 DIAGNOSIS — L0390 Cellulitis, unspecified: Secondary | ICD-10-CM

## 2012-04-10 DIAGNOSIS — F0390 Unspecified dementia without behavioral disturbance: Secondary | ICD-10-CM

## 2012-04-10 DIAGNOSIS — Y9210 Unspecified residential institution as the place of occurrence of the external cause: Secondary | ICD-10-CM | POA: Insufficient documentation

## 2012-04-10 DIAGNOSIS — K219 Gastro-esophageal reflux disease without esophagitis: Secondary | ICD-10-CM | POA: Insufficient documentation

## 2012-04-10 MED ORDER — GENTAMICIN SULFATE 0.3 % OP SOLN: 1.0000 [drp] | OPHTHALMIC | Status: DC

## 2012-04-10 MED ORDER — CEPHALEXIN 500 MG PO CAPS: 500.0000 mg | ORAL_CAPSULE | Freq: Two times a day (BID) | ORAL | Status: DC

## 2012-04-10 MED ORDER — BACITRACIN ZINC 500 UNIT/GM EX OINT
1.0000 "application " | TOPICAL_OINTMENT | Freq: Two times a day (BID) | CUTANEOUS | Status: DC
  Administered 2012-04-10: 1 via TOPICAL
  Filled 2012-04-10: qty 15

## 2012-04-10 MED ORDER — TETANUS-DIPHTH-ACELL PERTUSSIS 5-2.5-18.5 LF-MCG/0.5 IM SUSP
0.5000 mL | Freq: Once | INTRAMUSCULAR | Status: AC
  Administered 2012-04-10: 0.5 mL via INTRAMUSCULAR
  Filled 2012-04-10: qty 0.5

## 2012-04-10 MED ORDER — LIDOCAINE HCL 2 % IJ SOLN
20.0000 mL | Freq: Once | INTRAMUSCULAR | Status: DC
  Filled 2012-04-10: qty 20

## 2012-04-10 NOTE — ED Notes (Signed)
[employer]. Spoke with Anette Riedel, Charity fundraiser. Reviewed discharge instructions, specific care of wounds, starting of 2 new prescriptions.

## 2012-04-10 NOTE — ED Notes (Signed)
Report given to [facility] for transport to [facility]

## 2012-04-10 NOTE — ED Notes (Signed)
Pt to CT

## 2012-04-10 NOTE — ED Notes (Signed)
Per EMS, nursing home staff found patient on ground at 0530 this morning. LOC unknown. Pt has laceration to left ear and left elbow. Pt in KED and C-collar, per EMS, no pain on spine with palpation. Pt alert and interactive. Pt has hx dementia.

## 2012-04-10 NOTE — ED Provider Notes (Signed)
History     CSN: [id number]  Arrival date & time 04/10/12  0559   First MD Initiated Contact with Patient 04/10/12 [phone]      Chief Complaint  Patient presents with  . Fall  . Ear Laceration  . Extremity Laceration    (Consider location/radiation/quality/duration/timing/severity/associated sxs/prior treatment) HPI  [name] [name] is a 79 y.o. male resident of a nursing home brought in by EMS patient was found down at 5:30 this morning. Laceration to left ear and left elbow. Patient has no focal deficits. Level V caveat secondary to dementia  Past Medical History  Diagnosis Date  . Cardiac pacemaker in situ     s/p Viatron DDD Pacemaker for sinus node dysfunction  . Orthostatic hypotension   . Hyperlipidemia     mixed  . Atrial fibrillation     not felt to be a coumadin candidate due to falls  . Sinoatrial node dysfunction     s/p PPM (MDT)  . Coronary atherosclerosis of native coronary artery     s/p AWMI 2005 stent LAD  . Diabetes mellitus, type 2   . Chronic renal insufficiency, stage II (mild)   . Intolerance, drug     amniodarone  . Upper GI hemorrhage     2006 Mallory Weiss Tear  . MI (myocardial infarction)     coronary artery status post anterior wall myocardial infarction in 2005 treated with a stent to the left anterior descending.  . Dizziness     recurrent dizziness and falls  . Diabetes mellitus   . Hyperlipidemia   . Renal insufficiency     mild  . Dementia   . Depression   . CHF (congestive heart failure)   . GERD (gastroesophageal reflux disease)     Past Surgical History  Procedure Date  . Pacemaker placement   . Coronary stent placement     Family History  Problem Relation Age of Onset  . Coronary artery disease Brother   . Diabetes Mother     History  Substance Use Topics  . Smoking status: Former Smoker    Quit date: 03/28/1960  . Smokeless tobacco: Not on file  . Alcohol Use: No      Review of Systems  Unable to perform  ROS: Dementia    Allergies  Sulfonamide derivatives and Amiodarone hcl  Home Medications   Current Outpatient Rx  Name  Route  Sig  Dispense  Refill  . ACETAMINOPHEN 500 MG PO TABS   Oral   Take 1,000 mg by mouth every 6 (six) hours as needed. pain         . REFRESH LACRI-LUBE OP OINT   Ophthalmic   Apply 1 application to eye 3 (three) times daily as needed. 1/2 inch -  For discomfort         . ATORVASTATIN CALCIUM 10 MG PO TABS   Oral   Take 10 mg by mouth daily.         Marland Kitchen BRIMONIDINE TARTRATE 0.15 % OP SOLN   Right Eye   Place 1 drop into the right eye 2 (two) times daily.         . CEPHALEXIN 500 MG PO CAPS   Oral   Take 1 capsule (500 mg total) by mouth 2 (two) times daily.   14 capsule   0   . CLOPIDOGREL BISULFATE 75 MG PO TABS   Oral   Take 75 mg by mouth daily.           Marland Kitchen  DEXTROMETHORPHAN POLISTIREX ER 30 MG/5ML PO LQCR   Oral   Take 30 mg by mouth 2 (two) times daily as needed. For cough         . GLUCOSE 40 % PO GEL   Oral   Take 1 Tube by mouth once as needed. For glucose levels < 70         . DIGOXIN 0.25 MG PO TABS   Oral   Take 250 mcg by mouth daily.         Marland Kitchen FERROUS SULFATE 325 (65 FE) MG PO TABS   Oral   Take 325 mg by mouth daily with breakfast.           . FLUDROCORTISONE ACETATE 0.1 MG PO TABS   Oral   Take 0.1 mg by mouth 2 (two) times daily.          . FUROSEMIDE 20 MG PO TABS   Oral   Take 20 mg by mouth daily.         Marland Kitchen LATANOPROST 0.005 % OP SOLN   Both Eyes   Place 1 drop into both eyes at bedtime.         Marland Kitchen LORAZEPAM 0.5 MG PO TABS   Oral   Take 1 mg by mouth every 8 (eight) hours as needed. anxiety         . METFORMIN HCL 500 MG PO TABS   Oral   Take 500 mg by mouth 2 (two) times daily with a meal.         . METOPROLOL TARTRATE 50 MG PO TABS   Oral   Take 1 tablet (50 mg total) by mouth 2 (two) times daily.         Marland Kitchen OMEPRAZOLE 40 MG PO CPDR   Oral   Take 40 mg by mouth daily.           Marland Kitchen POLYETHYL GLYCOL-PROPYL GLYCOL 0.4-0.3 % OP SOLN   Ophthalmic   Apply 1 drop to eye 6 (six) times daily.          Marland Kitchen POTASSIUM CHLORIDE CRYS ER 20 MEQ PO TBCR   Oral   Take 20 mEq by mouth daily.           . TRAVOPROST (BAK FREE) 0.004 % OP SOLN   Both Eyes   Place 1 drop into both eyes at bedtime.         . VENLAFAXINE HCL ER 150 MG PO CP24   Oral   Take 150 mg by mouth daily.             BP 162/86  Pulse 69  Temp 97.5 F (36.4 C) (Oral)  Resp 18  SpO2 100%  Physical Exam  Nursing note and vitals reviewed. Constitutional: He is oriented to person, place, and time. He appears well-developed and well-nourished. No distress.       Frail-appearing, patient is immobilized in a C-spine collar in KED  HENT:  Head: Normocephalic.  Ears:  Mouth/Throat: Oropharynx is clear and moist.       3 cm full-thickness laceration to left ear penetrating through the cartilage. No gross contamination. Bleeding is not controlled.  Eyes: Conjunctivae normal and EOM are normal. Pupils are equal, round, and reactive to light. Left eye exhibits discharge.       Purulent discharge from left eye  Neck: Normal range of motion.  Cardiovascular: Normal rate, regular rhythm and intact distal pulses.   Pulmonary/Chest: Effort normal and breath  sounds normal. No stridor. No respiratory distress. He has no wheezes. He has no rales. He exhibits no tenderness.  Abdominal: Soft. Bowel sounds are normal. He exhibits no distension and no mass. There is no tenderness. There is no rebound and no guarding.  Musculoskeletal: Normal range of motion.  Neurological: He is alert and oriented to person, place, and time.  Skin:       Patient has 2 superficial skin tears to the left elbow measuring approximately 2 cm.  1 cm healing abrasion//radiation to right elbow with surrounding erythema and induration.   Psychiatric: He has a normal mood and affect.    ED Course  Procedures (including critical  care time)  LACERATION REPAIR Performed by: Wynetta Emery Authorized by: Wynetta Emery Consent: Verbal consent obtained. Risks and benefits: risks, benefits and alternatives were discussed Consent given by: patient Patient identity confirmed: Wrist band  Prepped and Draped in normal sterile fashion  Tetanus: Updated  Laceration Location: Left ear  Laceration Length: 3 cm  Anesthesia: Local ear block   Local anesthetic: 2% without epinephrine  Anesthetic total: 8 ml  Irrigation method: syringe  Amount of cleaning: copious   Wound explored to depth in good light on a bloodless field with no foreign bodies seen or palpated.   Skin closure: Inner layer closed with figure-of-eight suture using a 4-0 Vicryl Rapide, outer layer closed with 6-0 polypropylene   Number of sutures: 1 deep suture on the cartilaginous layer in a figure-of-eight formation, 6X simple interrupted superficial sutures   Technique: Simple interrupted   Patient tolerance: Patient tolerated the procedure well with no immediate complications.  Antibx ointment applied. Instructions for care discussed verbally and patient provided with additional written instructions for homecare and f/u.  Labs Reviewed - No data to display Ct Head Wo Contrast  04/10/2012  *RADIOLOGY REPORT*  Clinical Data:  Fall, head pain, neck pain.  CT HEAD WITHOUT CONTRAST CT CERVICAL SPINE WITHOUT CONTRAST  Technique:  Multidetector CT imaging of the head and cervical spine was performed following the standard protocol without intravenous contrast.  Multiplanar CT image reconstructions of the cervical spine were also generated.  Comparison:  03/01/2012.  CT HEAD  Findings: There is no evidence for acute infarction, intracranial hemorrhage, mass lesion, hydrocephalus, or extra-axial fluid. Moderate to severe atrophy is present.  Advanced chronic microvascular ischemic change is present in the periventricular and subcortical white  matter.  Remote left basal ganglia lacunar infarct.  Calvarium intact.  Left ear  swelling/hematoma.  No sinus or mastoid fluid.  Similar appearance to priors except for the  ear laceration. Bilateral  ear cerumen.  IMPRESSION:  Atrophy and chronic microvascular ischemic change.  Left ear hematoma.  No acute intracranial findings.  CT CERVICAL SPINE  Findings: No visible cervical spine fracture or traumatic subluxation.  No prevertebral soft tissue swelling.  Advanced degenerative disc disease at C2-3, C3-4, and C5-6.  No neck masses. Trachea midline.  COPD at the lung apices. Carotid atherosclerosis. No visible upper rib fractures.  IMPRESSION: Cervical spondylosis without acute injury.   Original Report Authenticated By: Davonna Belling, M.D.    Ct Cervical Spine Wo Contrast  04/10/2012  *RADIOLOGY REPORT*  Clinical Data:  Fall, head pain, neck pain.  CT HEAD WITHOUT CONTRAST CT CERVICAL SPINE WITHOUT CONTRAST  Technique:  Multidetector CT imaging of the head and cervical spine was performed following the standard protocol without intravenous contrast.  Multiplanar CT image reconstructions of the cervical spine were also generated.  Comparison:  03/01/2012.  CT HEAD  Findings: There is no evidence for acute infarction, intracranial hemorrhage, mass lesion, hydrocephalus, or extra-axial fluid. Moderate to severe atrophy is present.  Advanced chronic microvascular ischemic change is present in the periventricular and subcortical white matter.  Remote left basal ganglia lacunar infarct.  Calvarium intact.  Left ear  swelling/hematoma.  No sinus or mastoid fluid.  Similar appearance to priors except for the  ear laceration. Bilateral  ear cerumen.  IMPRESSION:  Atrophy and chronic microvascular ischemic change.  Left ear hematoma.  No acute intracranial findings.  CT CERVICAL SPINE  Findings: No visible cervical spine fracture or traumatic subluxation.  No prevertebral soft tissue swelling.  Advanced degenerative disc  disease at C2-3, C3-4, and C5-6.  No neck masses. Trachea midline.  COPD at the lung apices. Carotid atherosclerosis. No visible upper rib fractures.  IMPRESSION: Cervical spondylosis without acute injury.   Original Report Authenticated By: Davonna Belling, M.D.      1. Laceration   2. Conjunctivitis   3. Cellulitis   4. Head trauma   5. Fall   6. Dementia       MDM  Patient cleared from [facility] ED, C-spine collar left in place until CT resulted. CT head and cervical spine shows no abnormality.   Daughters requests no blood work or x-rays were performed. As she is not be medical power of attorney head CT and C-spine CT were performed. Discussed with Dr. advised her to obtain a medical power of attorney, she was understanding.  Laceration closed in 2 layers. I will also treat the patient for conjunctivitis and cellulitis of the right elbow.  New Prescriptions   CEPHALEXIN (KEFLEX) 500 MG CAPSULE    Take 1 capsule (500 mg total) by mouth 2 (two) times daily.   GENTAMICIN (GARAMYCIN) 0.3 % OPHTHALMIC SOLUTION    Place 1 drop into the left eye every 4 (four) hours.      Wynetta Emery, PA-C 04/10/12 [phone]

## 2012-04-10 NOTE — Discharge Instructions (Signed)
Wash arm wounds gently with soap and water every 12 hours, apply bacitracin and cover with a bandage or gauze.   Keep ear wound dry and do not remove dressing for 24 hours, after that wash gently morning and night with soap and water. Do NOT use rubbing alcohol or hydrogen peroxide, you may use a topical antibiotic ointment and cover with a bandaid.   Return for suture removal in 5-7 days  If you see signs of infection (warmth, redness, tenderness, pus, sharp increase in pain, fever) return for revaluation immediately.  Follow with your primary care physician in the next 24-48 hours. Return to the emergency room for any worsening or concerning symptoms.

## 2012-04-10 NOTE — ED Notes (Signed)
Pt sleeping, waiting for [facility].

## 2012-04-10 NOTE — ED Notes (Signed)
Pt returned from radiology. PA at beside suturing left ear laceration. Pt assisted to use urinal, changed into gown. Pt left eye showing yellow drainage. Will irrigate pt other wounds. Ordering patient breakfast tray. Per pt daughter, no diet restrictions.

## 2012-04-10 NOTE — ED Provider Notes (Signed)
Medical screening examination/treatment/procedure(s) were performed by non-physician practitioner and as supervising physician I was immediately available for consultation/collaboration.  Sunnie Nielsen, MD 04/10/12 [phone]

## 2012-04-10 NOTE — ED Notes (Addendum)
Pt's Daughter Marylene Buerger [phone]. Daughter requesting to hold procedures at this time. Pt daughter on way to [facility] at this time, EDPA aware of situation.

## 2012-04-10 NOTE — ED Notes (Signed)
[facility] is here to transport pt back to his facility

## 2012-05-21 ENCOUNTER — Encounter (HOSPITAL_COMMUNITY): Payer: Self-pay | Admitting: Emergency Medicine

## 2012-05-21 ENCOUNTER — Emergency Department (HOSPITAL_COMMUNITY)
Admission: EM | Admit: 2012-05-21 | Discharge: 2012-05-21 | Disposition: A | Discharge status: Skilled Nursing Facility | Payer: MEDICARE | Attending: Emergency Medicine | Admitting: Emergency Medicine

## 2012-05-21 DIAGNOSIS — I951 Orthostatic hypotension: Secondary | ICD-10-CM | POA: Insufficient documentation

## 2012-05-21 DIAGNOSIS — I252 Old myocardial infarction: Secondary | ICD-10-CM | POA: Insufficient documentation

## 2012-05-21 DIAGNOSIS — Z8719 Personal history of other diseases of the digestive system: Secondary | ICD-10-CM | POA: Insufficient documentation

## 2012-05-21 DIAGNOSIS — I2510 Atherosclerotic heart disease of native coronary artery without angina pectoris: Secondary | ICD-10-CM | POA: Insufficient documentation

## 2012-05-21 DIAGNOSIS — Z8679 Personal history of other diseases of the circulatory system: Secondary | ICD-10-CM | POA: Insufficient documentation

## 2012-05-21 DIAGNOSIS — Z950 Presence of cardiac pacemaker: Secondary | ICD-10-CM | POA: Insufficient documentation

## 2012-05-21 DIAGNOSIS — F0390 Unspecified dementia without behavioral disturbance: Secondary | ICD-10-CM | POA: Insufficient documentation

## 2012-05-21 DIAGNOSIS — Y9210 Unspecified residential institution as the place of occurrence of the external cause: Secondary | ICD-10-CM | POA: Insufficient documentation

## 2012-05-21 DIAGNOSIS — Z7902 Long term (current) use of antithrombotics/antiplatelets: Secondary | ICD-10-CM | POA: Insufficient documentation

## 2012-05-21 DIAGNOSIS — Y939 Activity, unspecified: Secondary | ICD-10-CM | POA: Insufficient documentation

## 2012-05-21 DIAGNOSIS — K219 Gastro-esophageal reflux disease without esophagitis: Secondary | ICD-10-CM | POA: Insufficient documentation

## 2012-05-21 DIAGNOSIS — N182 Chronic kidney disease, stage 2 (mild): Secondary | ICD-10-CM | POA: Insufficient documentation

## 2012-05-21 DIAGNOSIS — I509 Heart failure, unspecified: Secondary | ICD-10-CM | POA: Insufficient documentation

## 2012-05-21 DIAGNOSIS — E785 Hyperlipidemia, unspecified: Secondary | ICD-10-CM | POA: Insufficient documentation

## 2012-05-21 DIAGNOSIS — Z9861 Coronary angioplasty status: Secondary | ICD-10-CM | POA: Insufficient documentation

## 2012-05-21 DIAGNOSIS — Z79899 Other long term (current) drug therapy: Secondary | ICD-10-CM | POA: Insufficient documentation

## 2012-05-21 DIAGNOSIS — I4891 Unspecified atrial fibrillation: Secondary | ICD-10-CM | POA: Insufficient documentation

## 2012-05-21 DIAGNOSIS — Z8659 Personal history of other mental and behavioral disorders: Secondary | ICD-10-CM | POA: Insufficient documentation

## 2012-05-21 DIAGNOSIS — E119 Type 2 diabetes mellitus without complications: Secondary | ICD-10-CM | POA: Insufficient documentation

## 2012-05-21 DIAGNOSIS — X58XXXA Exposure to other specified factors, initial encounter: Secondary | ICD-10-CM | POA: Insufficient documentation

## 2012-05-21 DIAGNOSIS — Z87891 Personal history of nicotine dependence: Secondary | ICD-10-CM | POA: Insufficient documentation

## 2012-05-21 DIAGNOSIS — S51809A Unspecified open wound of unspecified forearm, initial encounter: Secondary | ICD-10-CM | POA: Insufficient documentation

## 2012-05-21 NOTE — ED Provider Notes (Signed)
History     CSN: [id number]  Arrival date & time 05/21/12  0805   First MD Initiated Contact with Patient 05/21/12 [phone]      Chief Complaint  Patient presents with  . Fall    (Consider location/radiation/quality/duration/timing/severity/associated sxs/prior treatment) HPI This 79 year old male with history of atrial fibrillation and dementia was found on the floor next to his bed this morning, the patient denies any fall is an unreliable historian, he has had 6 CT scans of his head and neck within the last year for a history of multiple falls, patient denies any complaints today, he does not have any evidence of any head trauma, he denies any headache neck pain back pain chest pain shortness breath abdominal pain or pain to his extremities. He has a superficial skin tear in his left proximal forearm, he has fragile tissue paper thin skin at baseline, he denies any new focal weakness or numbness, he denies any change in speech or vision, he is awake alert calm cooperative with clear speech and appears comfortable. Since he resides in a skilled nursing facility he is brought to the ED every time he has a possible fall. He is not a candidate to be on Coumadin due to multiple falls. Past Medical History  Diagnosis Date  . Cardiac pacemaker in situ     s/p Viatron DDD Pacemaker for sinus node dysfunction  . Orthostatic hypotension   . Hyperlipidemia     mixed  . Atrial fibrillation     not felt to be a coumadin candidate due to falls  . Sinoatrial node dysfunction     s/p PPM (MDT)  . Coronary atherosclerosis of native coronary artery     s/p AWMI 2005 stent LAD  . Diabetes mellitus, type 2   . Chronic renal insufficiency, stage II (mild)   . Intolerance, drug     amniodarone  . Upper GI hemorrhage     2006 Mallory Weiss Tear  . MI (myocardial infarction)     coronary artery status post anterior wall myocardial infarction in 2005 treated with a stent to the left anterior descending.   . Dizziness     recurrent dizziness and falls  . Diabetes mellitus   . Hyperlipidemia   . Renal insufficiency     mild  . Dementia   . Depression   . CHF (congestive heart failure)   . GERD (gastroesophageal reflux disease)     Past Surgical History  Procedure Laterality Date  . Pacemaker placement    . Coronary stent placement      Family History  Problem Relation Age of Onset  . Coronary artery disease Brother   . Diabetes Mother     History  Substance Use Topics  . Smoking status: Former Smoker    Quit date: 03/28/1960  . Smokeless tobacco: Not on file  . Alcohol Use: No      Review of Systems  Unable to perform ROS: Dementia    Allergies  Sulfonamide derivatives and Amiodarone hcl  Home Medications   Current Outpatient Rx  Name  Route  Sig  Dispense  Refill  . acetaminophen (TYLENOL) 500 MG tablet   Oral   Take 1,000 mg by mouth every 6 (six) hours as needed. pain         . Artificial Tear Ointment (REFRESH LACRI-LUBE) OINT   Ophthalmic   Apply 1 application to eye 3 (three) times daily as needed. 1/2 inch -  For discomfort         .  atorvastatin (LIPITOR) 10 MG tablet   Oral   Take 10 mg by mouth daily.         . brimonidine (ALPHAGAN) 0.15 % ophthalmic solution   Right Eye   Place 1 drop into the right eye 2 (two) times daily.         . clopidogrel (PLAVIX) 75 MG tablet   Oral   Take 75 mg by mouth daily.           Marland Kitchen dextromethorphan (DELSYM) 30 MG/5ML liquid   Oral   Take 30 mg by mouth 2 (two) times daily as needed. For cough         . dextrose (GLUTOSE) 40 % GEL   Oral   Take 1 Tube by mouth once as needed. For glucose levels < 70         . digoxin (LANOXIN) 0.25 MG tablet   Oral   Take 250 mcg by mouth daily.         . ferrous sulfate 325 (65 FE) MG tablet   Oral   Take 325 mg by mouth daily with breakfast.           . fludrocortisone (FLORINEF) 0.1 MG tablet   Oral   Take 0.1 mg by mouth 2 (two) times  daily.          . furosemide (LASIX) 20 MG tablet   Oral   Take 20 mg by mouth daily.         Marland Kitchen latanoprost (XALATAN) 0.005 % ophthalmic solution   Both Eyes   Place 1 drop into both eyes at bedtime.         Marland Kitchen LORazepam (ATIVAN) 0.5 MG tablet   Oral   Take 1 mg by mouth every 8 (eight) hours as needed. anxiety         . metoprolol (LOPRESSOR) 50 MG tablet   Oral   Take 1 tablet (50 mg total) by mouth 2 (two) times daily.         Marland Kitchen omeprazole (PRILOSEC) 40 MG capsule   Oral   Take 40 mg by mouth daily.         Bertram Gala Glycol-Propyl Glycol (SYSTANE) 0.4-0.3 % SOLN   Ophthalmic   Apply 1 drop to eye 6 (six) times daily.          . potassium chloride SA (K-DUR,KLOR-CON) 20 MEQ tablet   Oral   Take 20 mEq by mouth daily.           Marland Kitchen venlafaxine (EFFEXOR-XR) 150 MG 24 hr capsule   Oral   Take 150 mg by mouth daily.             BP 180/76  Pulse 73  Temp(Src) 98.1 F (36.7 C) (Oral)  Resp 18  SpO2 98%  Physical Exam  Nursing note and vitals reviewed. Constitutional:  Awake, alert, nontoxic appearance with baseline speech for patient.  HENT:  Head: Atraumatic.  Mouth/Throat: No oropharyngeal exudate.  Eyes: EOM are normal. Pupils are equal, round, and reactive to light. Right eye exhibits no discharge. Left eye exhibits no discharge.  Neck: Neck supple.  Cervical spine and back are nontender  Cardiovascular: Normal rate.   Murmur heard. Irregularly irregular rhythm  Pulmonary/Chest: Effort normal and breath sounds normal. No stridor. No respiratory distress. He has no wheezes. He has no rales. He exhibits no tenderness.  Abdominal: Soft. Bowel sounds are normal. He exhibits no mass. There is no tenderness. There is  no rebound.  Musculoskeletal: He exhibits no tenderness.  Baseline ROM, moves extremities with no obvious new focal weakness or tenderness. Superficial skin tear left proximal forearm without foreign body noted or significant deep  structure involvement noted.  Lymphadenopathy:    He has no cervical adenopathy.  Neurological: He is alert.  Awake, alert, cooperative; motor strength 5/5 bilaterally; sensation normal to light touch bilaterally; peripheral visual fields full to confrontation; no facial asymmetry; tongue midline; major cranial nerves appear intact; no pronator drift, normal finger to nose bilaterally  Skin: No rash noted.  Psychiatric: He has a normal mood and affect.    ED Course  Procedures (including critical care time)  Labs Reviewed - No data to display No results found.   1. Skin tear of forearm without complication, left, initial encounter   2. Dementia   3. Atrial fibrillation       MDM  Pt stable in ED with no significant deterioration in condition.  Patient / Family / Caregiver informed of clinical course, understand medical decision-making process, and agree with plan.  I doubt any other EMC precluding discharge at this time including, but not necessarily limited to the following:[facility].        Hurman Horn, MD 05/28/12 [phone]

## 2012-05-21 NOTE — ED Notes (Signed)
PerEMS: From [place]. Pt states he just laid on floor when he got out of bed but staff found pt laying on floor. Skin tare to left elbow, pt has no complaints.

## 2012-09-11 ENCOUNTER — Encounter (HOSPITAL_COMMUNITY): Payer: Self-pay | Admitting: Emergency Medicine

## 2012-09-11 ENCOUNTER — Emergency Department (HOSPITAL_COMMUNITY)
Admission: EM | Admit: 2012-09-11 | Discharge: 2012-09-11 | Disposition: A | Discharge status: Home / Self Care | Payer: MEDICARE | Attending: Emergency Medicine | Admitting: Emergency Medicine

## 2012-09-11 ENCOUNTER — Emergency Department (HOSPITAL_COMMUNITY): Payer: MEDICARE

## 2012-09-11 DIAGNOSIS — I252 Old myocardial infarction: Secondary | ICD-10-CM | POA: Insufficient documentation

## 2012-09-11 DIAGNOSIS — S0990XA Unspecified injury of head, initial encounter: Secondary | ICD-10-CM | POA: Insufficient documentation

## 2012-09-11 DIAGNOSIS — I2510 Atherosclerotic heart disease of native coronary artery without angina pectoris: Secondary | ICD-10-CM | POA: Insufficient documentation

## 2012-09-11 DIAGNOSIS — Y9210 Unspecified residential institution as the place of occurrence of the external cause: Secondary | ICD-10-CM | POA: Insufficient documentation

## 2012-09-11 DIAGNOSIS — Z9861 Coronary angioplasty status: Secondary | ICD-10-CM | POA: Insufficient documentation

## 2012-09-11 DIAGNOSIS — I509 Heart failure, unspecified: Secondary | ICD-10-CM | POA: Insufficient documentation

## 2012-09-11 DIAGNOSIS — F0390 Unspecified dementia without behavioral disturbance: Secondary | ICD-10-CM | POA: Insufficient documentation

## 2012-09-11 DIAGNOSIS — Z950 Presence of cardiac pacemaker: Secondary | ICD-10-CM | POA: Insufficient documentation

## 2012-09-11 DIAGNOSIS — I4891 Unspecified atrial fibrillation: Secondary | ICD-10-CM | POA: Insufficient documentation

## 2012-09-11 DIAGNOSIS — K219 Gastro-esophageal reflux disease without esophagitis: Secondary | ICD-10-CM | POA: Insufficient documentation

## 2012-09-11 DIAGNOSIS — Z8719 Personal history of other diseases of the digestive system: Secondary | ICD-10-CM | POA: Insufficient documentation

## 2012-09-11 DIAGNOSIS — N182 Chronic kidney disease, stage 2 (mild): Secondary | ICD-10-CM | POA: Insufficient documentation

## 2012-09-11 DIAGNOSIS — N390 Urinary tract infection, site not specified: Secondary | ICD-10-CM | POA: Insufficient documentation

## 2012-09-11 DIAGNOSIS — W19XXXA Unspecified fall, initial encounter: Secondary | ICD-10-CM

## 2012-09-11 DIAGNOSIS — Z79899 Other long term (current) drug therapy: Secondary | ICD-10-CM | POA: Insufficient documentation

## 2012-09-11 DIAGNOSIS — Z87891 Personal history of nicotine dependence: Secondary | ICD-10-CM | POA: Insufficient documentation

## 2012-09-11 DIAGNOSIS — F3289 Other specified depressive episodes: Secondary | ICD-10-CM | POA: Insufficient documentation

## 2012-09-11 DIAGNOSIS — R296 Repeated falls: Secondary | ICD-10-CM | POA: Insufficient documentation

## 2012-09-11 DIAGNOSIS — F329 Major depressive disorder, single episode, unspecified: Secondary | ICD-10-CM | POA: Insufficient documentation

## 2012-09-11 DIAGNOSIS — Z7902 Long term (current) use of antithrombotics/antiplatelets: Secondary | ICD-10-CM | POA: Insufficient documentation

## 2012-09-11 DIAGNOSIS — Z8679 Personal history of other diseases of the circulatory system: Secondary | ICD-10-CM | POA: Insufficient documentation

## 2012-09-11 DIAGNOSIS — IMO0002 Reserved for concepts with insufficient information to code with codable children: Secondary | ICD-10-CM | POA: Insufficient documentation

## 2012-09-11 DIAGNOSIS — Y9389 Activity, other specified: Secondary | ICD-10-CM | POA: Insufficient documentation

## 2012-09-11 DIAGNOSIS — E785 Hyperlipidemia, unspecified: Secondary | ICD-10-CM | POA: Insufficient documentation

## 2012-09-11 DIAGNOSIS — Z8669 Personal history of other diseases of the nervous system and sense organs: Secondary | ICD-10-CM | POA: Insufficient documentation

## 2012-09-11 DIAGNOSIS — E119 Type 2 diabetes mellitus without complications: Secondary | ICD-10-CM | POA: Insufficient documentation

## 2012-09-11 LAB — URINALYSIS, ROUTINE W REFLEX MICROSCOPIC
Bilirubin Urine: NEGATIVE
Glucose, UA: NEGATIVE mg/dL
Ketones, ur: NEGATIVE mg/dL
Nitrite: NEGATIVE
Protein, ur: NEGATIVE mg/dL
Specific Gravity, Urine: 1.016 (ref 1.005–1.030)
Urobilinogen, UA: 1 mg/dL (ref 0.0–1.0)
pH: 5.5 (ref 5.0–8.0)

## 2012-09-11 LAB — URINE MICROSCOPIC-ADD ON

## 2012-09-11 MED ORDER — LORAZEPAM 1 MG PO TABS
1.0000 mg | ORAL_TABLET | Freq: Once | ORAL | Status: AC
  Administered 2012-09-11: 1 mg via ORAL
  Filled 2012-09-11: qty 1

## 2012-09-11 MED ORDER — CIPROFLOXACIN HCL 500 MG PO TABS: 500.0000 mg | ORAL_TABLET | Freq: Two times a day (BID) | ORAL | Status: DC

## 2012-09-11 NOTE — ED Notes (Signed)
Pt arrives by EMS from [facility] nursing home. Pt fell from standing position. Pt did hit head. No LOC. Pt denies complaints. EMS reports hematoma to back of head. Pt has hx of dementia.

## 2012-09-11 NOTE — ED Notes (Signed)
O2 sat incorrect.  Poor placement of monitor.  O2 sats actually 100%

## 2012-09-11 NOTE — ED Notes (Signed)
[facility] called for transport to [facility].

## 2012-09-11 NOTE — ED Notes (Signed)
Daughter called for an update

## 2012-09-11 NOTE — ED Provider Notes (Signed)
History    89yM presenting after fall. Reportedly fell from standing. Happened shortly before arrival. Did hit head. No LOC. Pt with no complaints. Says remembers fall. Triage note reviewed. Pt denies LOC. Questionable historian though. No reported change in mental status. No blood thinners per med list.   CSN: [id number]  Arrival date & time 09/11/12  1332   First MD Initiated Contact with Patient 09/11/12 1353      Chief Complaint  Patient presents with  . Fall    (Consider location/radiation/quality/duration/timing/severity/associated sxs/prior treatment) HPI  Past Medical History  Diagnosis Date  . Cardiac pacemaker in situ     s/p Viatron DDD Pacemaker for sinus node dysfunction  . Orthostatic hypotension   . Hyperlipidemia     mixed  . Atrial fibrillation     not felt to be a coumadin candidate due to falls  . Sinoatrial node dysfunction     s/p PPM (MDT)  . Coronary atherosclerosis of native coronary artery     s/p AWMI 2005 stent LAD  . Diabetes mellitus, type 2   . Chronic renal insufficiency, stage II (mild)   . Intolerance, drug     amniodarone  . Upper GI hemorrhage     2006 Mallory Weiss Tear  . MI (myocardial infarction)     coronary artery status post anterior wall myocardial infarction in 2005 treated with a stent to the left anterior descending.  . Dizziness     recurrent dizziness and falls  . Diabetes mellitus   . Hyperlipidemia   . Renal insufficiency     mild  . Dementia   . Depression   . CHF (congestive heart failure)   . GERD (gastroesophageal reflux disease)     Past Surgical History  Procedure Laterality Date  . Pacemaker placement    . Coronary stent placement      Family History  Problem Relation Age of Onset  . Coronary artery disease Brother   . Diabetes Mother     History  Substance Use Topics  . Smoking status: Former Smoker    Quit date: 03/28/1960  . Smokeless tobacco: Not on file  . Alcohol Use: No      Review  of Systems  Level 5 caveat applies because pt has dementia.    Allergies  Sulfonamide derivatives and Amiodarone hcl  Home Medications   Current Outpatient Rx  Name  Route  Sig  Dispense  Refill  . acetaminophen (TYLENOL) 500 MG tablet   Oral   Take 1,000 mg by mouth every 6 (six) hours as needed. pain         . Artificial Tear Ointment (REFRESH LACRI-LUBE) OINT   Ophthalmic   Apply 1 application to eye 3 (three) times daily as needed. 1/2 inch -  For discomfort         . atorvastatin (LIPITOR) 10 MG tablet   Oral   Take 10 mg by mouth daily.         . brimonidine (ALPHAGAN) 0.15 % ophthalmic solution   Right Eye   Place 1 drop into the right eye 2 (two) times daily.         . clopidogrel (PLAVIX) 75 MG tablet   Oral   Take 75 mg by mouth daily.           Marland Kitchen dextromethorphan (DELSYM) 30 MG/5ML liquid   Oral   Take 30 mg by mouth 2 (two) times daily as needed. For cough         .  dextrose (GLUTOSE) 40 % GEL   Oral   Take 1 Tube by mouth once as needed. For glucose levels < 70         . digoxin (LANOXIN) 0.25 MG tablet   Oral   Take 250 mcg by mouth daily.         . ferrous sulfate 325 (65 FE) MG tablet   Oral   Take 325 mg by mouth daily with breakfast.           . fludrocortisone (FLORINEF) 0.1 MG tablet   Oral   Take 0.1 mg by mouth 2 (two) times daily.          . furosemide (LASIX) 20 MG tablet   Oral   Take 20 mg by mouth daily.         Marland Kitchen latanoprost (XALATAN) 0.005 % ophthalmic solution   Both Eyes   Place 1 drop into both eyes at bedtime.         Marland Kitchen LORazepam (ATIVAN) 0.5 MG tablet   Oral   Take 1 mg by mouth every 8 (eight) hours as needed. anxiety         . omeprazole (PRILOSEC) 40 MG capsule   Oral   Take 40 mg by mouth daily.         Bertram Gala Glycol-Propyl Glycol (SYSTANE) 0.4-0.3 % SOLN   Ophthalmic   Apply 1 drop to eye 6 (six) times daily.          . potassium chloride SA (K-DUR,KLOR-CON) 20 MEQ  tablet   Oral   Take 20 mEq by mouth daily.             BP 171/86  Temp(Src) 98 F (36.7 C) (Oral)  Resp 19  SpO2 100%  Physical Exam  Nursing note and vitals reviewed. Constitutional: He appears well-developed and well-nourished. No distress.  Laying in bed. NAD. Somewhat cantankerous, but not to point of what I would called agitated.   HENT:  Head: Normocephalic and atraumatic.  Eyes: Conjunctivae are normal. Pupils are equal, round, and reactive to light. Right eye exhibits no discharge. Left eye exhibits no discharge.  Neck: Neck supple.  Cardiovascular: Normal rate, regular rhythm and normal heart sounds.  Exam reveals no gallop and no friction rub.   No murmur heard. Pulmonary/Chest: Effort normal and breath sounds normal. No respiratory distress.  Abdominal: Soft. He exhibits no distension. There is no tenderness.  Musculoskeletal: He exhibits no edema and no tenderness.  No midline spinal tenderness. No bony tenderness of extremities. No apparent pain with ROM of large joints.   Neurological: He is alert. No cranial nerve deficit. He exhibits normal muscle tone. Coordination normal.  Disoriented to time. Answers basic questions, but not always consistent responses.   Skin: Skin is warm and dry.  Psychiatric: He has a normal mood and affect. His behavior is normal. Thought content normal.    ED Course  Procedures (including critical care time)  Labs Reviewed - No data to display No results found.  EKG:  Rhythm: atrial fibrillation Vent. rate 92 BPM QRS duration 86 ms QT/QTc 368/455 ms ST segments: NS ST changes   1. Fall, initial encounter   2. UTI (urinary tract infection)       MDM  89yM presenting after fall. Appears to be at his baseline. Imaging neg for emergent traumatic injury. EKG with afib. Hx of same. HD stable. UA consistent with UTI. Potentially contributing to fall in elderly pt. I feels  appropriate for outpt tx though.          Raeford Razor, MD 09/20/12 [phone]

## 2012-09-11 NOTE — ED Notes (Signed)
EMS states that he fell at nursing home and hit is head. Stated no loss of consciousness. Pt states that he was "out for 30 minutes". No obvious injuries noted. States that he does not hurt anywhere. A&O x4

## 2012-09-13 LAB — URINE CULTURE: Colony Count: >=100000

## 2012-10-23 ENCOUNTER — Encounter: Payer: Self-pay | Admitting: *Deleted

## 2012-11-01 ENCOUNTER — Ambulatory Visit (INDEPENDENT_AMBULATORY_CARE_PROVIDER_SITE_OTHER): Payer: MEDICARE | Admitting: *Deleted

## 2012-11-01 ENCOUNTER — Encounter: Payer: Self-pay | Admitting: Internal Medicine

## 2012-11-01 DIAGNOSIS — Z950 Presence of cardiac pacemaker: Secondary | ICD-10-CM

## 2012-11-01 DIAGNOSIS — I4891 Unspecified atrial fibrillation: Secondary | ICD-10-CM

## 2012-11-01 DIAGNOSIS — I495 Sick sinus syndrome: Secondary | ICD-10-CM

## 2012-11-01 LAB — PACEMAKER DEVICE OBSERVATION
AL AMPLITUDE: 0.5 mv
AL IMPEDENCE PM: 450 Ohm
RV LEAD AMPLITUDE: 8 mv
RV LEAD IMPEDENCE PM: 550 Ohm
RV LEAD THRESHOLD: 0.625 V

## 2012-11-01 NOTE — Progress Notes (Signed)
Vitatron ppm check in clinic.  All functions normal, no changes made, full details in [facility].  Pt no longer doing [facility]; pt now at [facility].  Plan to follow device clinic every six months and annually with Dr. Johney Frame  [name] [name]

## 2012-12-20 ENCOUNTER — Encounter: Payer: Self-pay | Admitting: Cardiovascular Disease

## 2012-12-20 ENCOUNTER — Ambulatory Visit (INDEPENDENT_AMBULATORY_CARE_PROVIDER_SITE_OTHER): Payer: MEDICARE | Admitting: Cardiovascular Disease

## 2012-12-20 VITALS — BP 124/80 | HR 80 | Ht 70.0 in | Wt 124.0 lb

## 2012-12-20 DIAGNOSIS — I4891 Unspecified atrial fibrillation: Secondary | ICD-10-CM

## 2012-12-20 DIAGNOSIS — I2510 Atherosclerotic heart disease of native coronary artery without angina pectoris: Secondary | ICD-10-CM

## 2012-12-20 NOTE — Patient Instructions (Addendum)
Your physician wants you to follow-up in:  12 months.  You will receive a reminder letter in the mail two months in advance. If you don't receive a letter, please call our office to schedule the follow-up appointment.   

## 2012-12-20 NOTE — Progress Notes (Signed)
History of Present Illness: 79 yo WM with history of CAD, systolic CHF, atrial fibrillation here for cardiac follow up. [name] [name] has been followed in the past by Dr. Juanda Chance. [name] [name] had an anterior MI 2005 treated with a stent to the LAD. [name] [name] has chronic atrial fibrillation which is being managed with rate control with metoprolol. [name] [name] had been on coumadin but this was stopped secondary to his fall and bleeding risk. [name] [name] has a pacemaker. His device has been followed by Dr. Juanda Chance here in the office with remote monitoring by Dr. Ladona Ridgel. [name] [name] also has a history of systolic CHF with an ejection fraction of 45-50%. In July 2011, [name] [name] suffered a stroke and was admitted to the hospital. [name] [name] has severe dementia and resides in an assisted living memory unit facility [facility] His main limitation now is related to memory problems.   [name] [name] is here today for follow up. No SOB, dizziness, near syncope or syncope. No chest pains. Overall doing well. His daughter is with him today and reports that [name] [name] is doing well.   Primary Care Physician: Dr. Redmond School  Past Medical History  Diagnosis Date  . Cardiac pacemaker in situ     s/p Viatron DDD Pacemaker for sinus node dysfunction  . Orthostatic hypotension   . Hyperlipidemia     mixed  . Atrial fibrillation     not felt to be a coumadin candidate due to falls  . Sinoatrial node dysfunction     s/p PPM (MDT)  . Coronary atherosclerosis of native coronary artery     s/p AWMI 2005 stent LAD  . Diabetes mellitus, type 2   . Chronic renal insufficiency, stage II (mild)   . Intolerance, drug     amniodarone  . Upper GI hemorrhage     2006 Mallory Weiss Tear  . MI (myocardial infarction)     coronary artery status post anterior wall myocardial infarction in 2005 treated with a stent to the left anterior descending.  . Dizziness     recurrent dizziness and falls  . Diabetes mellitus   . Hyperlipidemia   . Renal insufficiency     mild  . Dementia   . Depression   . CHF  (congestive heart failure)   . GERD (gastroesophageal reflux disease)     Past Surgical History  Procedure Laterality Date  . Pacemaker placement    . Coronary stent placement      Current Outpatient Prescriptions  Medication Sig Dispense Refill  . acetaminophen (TYLENOL) 500 MG tablet Take 1,000 mg by mouth every 6 (six) hours as needed. pain      . Artificial Tear Ointment (REFRESH LACRI-LUBE) OINT Apply 1 application to eye 3 (three) times daily as needed. 1/2 inch -  For discomfort      . atorvastatin (LIPITOR) 10 MG tablet Take 10 mg by mouth daily.      . beta carotene w/minerals (OCUVITE) tablet Take 1 tablet by mouth daily.      . brimonidine (ALPHAGAN) 0.15 % ophthalmic solution Place 1 drop into the right eye 2 (two) times daily.      . ciprofloxacin (CIPRO) 500 MG tablet Take 1 tablet (500 mg total) by mouth every 12 (twelve) hours.  10 tablet  0  . clopidogrel (PLAVIX) 75 MG tablet Take 75 mg by mouth daily.        Marland Kitchen dextromethorphan (DELSYM) 30 MG/5ML liquid Take 30 mg by mouth 2 (two) times daily as needed. For cough      .  digoxin (LANOXIN) 0.25 MG tablet Take 250 mcg by mouth daily.      . ferrous sulfate 325 (65 FE) MG tablet Take 325 mg by mouth daily with breakfast.        . fludrocortisone (FLORINEF) 0.1 MG tablet Take 0.1 mg by mouth 2 (two) times daily.       . furosemide (LASIX) 20 MG tablet Take 20 mg by mouth daily.      Marland Kitchen latanoprost (XALATAN) 0.005 % ophthalmic solution Place 1 drop into both eyes at bedtime.      Marland Kitchen lisinopril (PRINIVIL,ZESTRIL) 2.5 MG tablet Take 2.5 mg by mouth daily.      . metFORMIN (GLUCOPHAGE) 500 MG tablet Take 500 mg by mouth 2 (two) times daily with a meal.      . metoprolol tartrate (LOPRESSOR) 25 MG tablet Take 25 mg by mouth 2 (two) times daily.      Marland Kitchen omeprazole (PRILOSEC) 40 MG capsule Take 40 mg by mouth daily.      Bertram Gala Glycol-Propyl Glycol (SYSTANE) 0.4-0.3 % SOLN Apply 1 drop to eye 6 (six) times daily.       .  polyvinyl alcohol (LIQUIFILM TEARS) 1.4 % ophthalmic solution Place 1 drop into both eyes 6 (six) times daily.      . potassium chloride SA (K-DUR,KLOR-CON) 20 MEQ tablet Take 20 mEq by mouth daily.        Marland Kitchen venlafaxine (EFFEXOR) 75 MG tablet Take 75 mg by mouth 2 (two) times daily.      Marland Kitchen dextrose (GLUTOSE) 40 % GEL Take 1 Tube by mouth once as needed. For glucose levels < 70       No current facility-administered medications for this visit.    Allergies  Allergen Reactions  . Sulfonamide Derivatives     REACTION: hives  . Amiodarone Hcl Rash    History   Social History  . Marital Status: Widowed    Spouse Name: N/A    Number of Children: N/A  . Years of Education: N/A   Occupational History  . retired     now living in [facility]   Social History Main Topics  . Smoking status: Former Smoker    Quit date: 03/28/1960  . Smokeless tobacco: Not on file  . Alcohol Use: No  . Drug Use: No  . Sexual Activity: Not on file   Other Topics Concern  . 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  Accompanied by daughter today.    Family History  Problem Relation Age of Onset  . Coronary artery disease Brother   . Diabetes Mother     Review of Systems:  As stated in the HPI and otherwise negative.   BP 124/80  Pulse 80  Ht 5\' 10"  (1.778 m)  Wt 124 lb (56.246 kg)  BMI 17.79 kg/m2  Physical Examination: General: Thin, cachectic, NAD HEENT: OP clear, mucus membranes moist SKIN: warm, dry. No rashes. Neuro: No focal deficits Musculoskeletal: Muscle strength 5/5 all ext Psychiatric: Flat affect Neck: No JVD, no carotid bruits, no thyromegaly, no lymphadenopathy. Lungs:Clear bilaterally, no wheezes, rhonci, crackles Cardiovascular: Irregular. Systolic murmu. No gallops or rubs. Abdomen:Soft. Bowel sounds present. Non-tender.  Extremities: No lower extremity edema. Pulses are 2 + in the bilateral  DP/PT.  Assessment and Plan:   1. CAD: Stable. No changes in therapy.     2. Cardiomyopathy, ischemic: Volume status is ok. Continue  medical therapy.    3. PACEMAKER, PERMANENT: Follow up with DR. Allred. Checked last month and working well.   4. ATRIAL FIBRILLATION: Rate seems to be controlled on exam. Tolerating beta blocker and digoxin.

## 2013-03-27 ENCOUNTER — Observation Stay (HOSPITAL_BASED_OUTPATIENT_CLINIC_OR_DEPARTMENT_OTHER)
Admission: EM | Admit: 2013-03-27 | Discharge: 2013-03-28 | Disposition: A | Discharge status: Skilled Nursing Facility | Payer: MEDICARE | Source: Home / Self Care | Admitting: Internal Medicine

## 2013-03-27 ENCOUNTER — Emergency Department (HOSPITAL_COMMUNITY)
Admission: EM | Admit: 2013-03-27 | Discharge: 2013-03-27 | Disposition: A | Discharge status: Skilled Nursing Facility | Payer: MEDICARE | Attending: Emergency Medicine | Admitting: Emergency Medicine

## 2013-03-27 ENCOUNTER — Encounter (HOSPITAL_COMMUNITY): Payer: Self-pay | Admitting: Emergency Medicine

## 2013-03-27 DIAGNOSIS — I495 Sick sinus syndrome: Secondary | ICD-10-CM

## 2013-03-27 DIAGNOSIS — I4891 Unspecified atrial fibrillation: Secondary | ICD-10-CM

## 2013-03-27 DIAGNOSIS — R296 Repeated falls: Secondary | ICD-10-CM

## 2013-03-27 DIAGNOSIS — R4182 Altered mental status, unspecified: Secondary | ICD-10-CM

## 2013-03-27 DIAGNOSIS — E119 Type 2 diabetes mellitus without complications: Secondary | ICD-10-CM

## 2013-03-27 DIAGNOSIS — I951 Orthostatic hypotension: Secondary | ICD-10-CM

## 2013-03-27 DIAGNOSIS — Z950 Presence of cardiac pacemaker: Secondary | ICD-10-CM

## 2013-03-27 DIAGNOSIS — I2510 Atherosclerotic heart disease of native coronary artery without angina pectoris: Secondary | ICD-10-CM | POA: Insufficient documentation

## 2013-03-27 DIAGNOSIS — E162 Hypoglycemia, unspecified: Secondary | ICD-10-CM

## 2013-03-27 DIAGNOSIS — N183 Chronic kidney disease, stage 3 unspecified: Secondary | ICD-10-CM

## 2013-03-27 DIAGNOSIS — I5022 Chronic systolic (congestive) heart failure: Secondary | ICD-10-CM

## 2013-03-27 DIAGNOSIS — F329 Major depressive disorder, single episode, unspecified: Secondary | ICD-10-CM

## 2013-03-27 DIAGNOSIS — F3289 Other specified depressive episodes: Secondary | ICD-10-CM | POA: Insufficient documentation

## 2013-03-27 DIAGNOSIS — N182 Chronic kidney disease, stage 2 (mild): Secondary | ICD-10-CM | POA: Insufficient documentation

## 2013-03-27 DIAGNOSIS — K219 Gastro-esophageal reflux disease without esophagitis: Secondary | ICD-10-CM | POA: Insufficient documentation

## 2013-03-27 DIAGNOSIS — I482 Chronic atrial fibrillation, unspecified: Secondary | ICD-10-CM

## 2013-03-27 DIAGNOSIS — K319 Disease of stomach and duodenum, unspecified: Secondary | ICD-10-CM

## 2013-03-27 DIAGNOSIS — N259 Disorder resulting from impaired renal tubular function, unspecified: Secondary | ICD-10-CM

## 2013-03-27 DIAGNOSIS — E785 Hyperlipidemia, unspecified: Secondary | ICD-10-CM | POA: Insufficient documentation

## 2013-03-27 DIAGNOSIS — N390 Urinary tract infection, site not specified: Secondary | ICD-10-CM

## 2013-03-27 DIAGNOSIS — R1319 Other dysphagia: Secondary | ICD-10-CM

## 2013-03-27 DIAGNOSIS — I255 Ischemic cardiomyopathy: Secondary | ICD-10-CM

## 2013-03-27 DIAGNOSIS — R634 Abnormal weight loss: Secondary | ICD-10-CM

## 2013-03-27 DIAGNOSIS — R079 Chest pain, unspecified: Secondary | ICD-10-CM

## 2013-03-27 DIAGNOSIS — I509 Heart failure, unspecified: Secondary | ICD-10-CM | POA: Insufficient documentation

## 2013-03-27 DIAGNOSIS — K222 Esophageal obstruction: Secondary | ICD-10-CM

## 2013-03-27 DIAGNOSIS — R269 Unspecified abnormalities of gait and mobility: Secondary | ICD-10-CM

## 2013-03-27 DIAGNOSIS — F0390 Unspecified dementia without behavioral disturbance: Secondary | ICD-10-CM | POA: Insufficient documentation

## 2013-03-27 HISTORY — DX: Type 2 diabetes mellitus without complications: E11.9

## 2013-03-27 HISTORY — DX: Weakness: R53.1

## 2013-03-27 HISTORY — DX: Unspecified glaucoma: H40.9

## 2013-03-27 HISTORY — DX: Gastro-esophageal reflux disease without esophagitis: K21.9

## 2013-03-27 HISTORY — DX: Major depressive disorder, single episode, unspecified: F32.9

## 2013-03-27 HISTORY — DX: Unspecified atrial fibrillation: I48.91

## 2013-03-27 HISTORY — DX: Heart failure, unspecified: I50.9

## 2013-03-27 HISTORY — DX: Anemia, unspecified: D64.9

## 2013-03-27 HISTORY — DX: Hyperlipidemia, unspecified: E78.5

## 2013-03-27 HISTORY — DX: Unspecified fall, initial encounter: W19.XXXA

## 2013-03-27 HISTORY — DX: Vascular dementia without behavioral disturbance: F01.50

## 2013-03-27 HISTORY — DX: Cerebral infarction, unspecified: I63.9

## 2013-03-27 LAB — CBC WITH DIFFERENTIAL/PLATELET
Basophils Absolute: 0 10*3/uL (ref 0.0–0.1)
Eosinophils Absolute: 0.1 10*3/uL (ref 0.0–0.7)
HCT: 36.8 % — ABNORMAL LOW (ref 39.0–52.0)
Lymphocytes Relative: 12 % (ref 12–46)
MCHC: 32.3 g/dL (ref 30.0–36.0)
Monocytes Absolute: 0.4 10*3/uL (ref 0.1–1.0)
Neutro Abs: 3.8 10*3/uL (ref 1.7–7.7)
Neutrophils Relative %: 76 % (ref 43–77)
RDW: 14.9 % (ref 11.5–15.5)
WBC: 5 10*3/uL (ref 4.0–10.5)

## 2013-03-27 LAB — URINALYSIS W MICROSCOPIC + REFLEX CULTURE
Bilirubin Urine: NEGATIVE
Glucose, UA: NEGATIVE mg/dL
Hgb urine dipstick: NEGATIVE
Ketones, ur: NEGATIVE mg/dL
Protein, ur: 30 mg/dL — AB
Urobilinogen, UA: 2 mg/dL — ABNORMAL HIGH (ref 0.0–1.0)
pH: 6.5 (ref 5.0–8.0)

## 2013-03-27 LAB — COMPREHENSIVE METABOLIC PANEL
ALT: 8 U/L (ref 0–53)
AST: 13 U/L (ref 0–37)
Alkaline Phosphatase: 79 U/L (ref 39–117)
BUN: 27 mg/dL — ABNORMAL HIGH (ref 6–23)
CO2: 29 mEq/L (ref 19–32)
Chloride: 103 mEq/L (ref 96–112)
Creatinine, Ser: 1.18 mg/dL (ref 0.50–1.35)
GFR calc non Af Amer: 54 mL/min — ABNORMAL LOW (ref >90)
Potassium: 4 mEq/L (ref 3.7–5.3)
Sodium: 141 mEq/L (ref 137–147)
Total Bilirubin: 1.2 mg/dL (ref 0.3–1.2)
Total Protein: 6.6 g/dL (ref 6.0–8.3)

## 2013-03-27 LAB — GLUCOSE, CAPILLARY
Glucose-Capillary: 91 mg/dL (ref 70–99)
Glucose-Capillary: 56 mg/dL — ABNORMAL LOW (ref 70–99)
Glucose-Capillary: 133 mg/dL — ABNORMAL HIGH (ref 70–99)
Glucose-Capillary: 87 mg/dL (ref 70–99)

## 2013-03-27 MED ORDER — BRIMONIDINE TARTRATE 0.15 % OP SOLN
1.0000 [drp] | Freq: Two times a day (BID) | OPHTHALMIC | Status: DC
  Administered 2013-03-27 – 2013-03-28 (×2): 1 [drp] via OPHTHALMIC
  Filled 2013-03-27: qty 5

## 2013-03-27 MED ORDER — ACETAMINOPHEN 650 MG RE SUPP: 650.0000 mg | Freq: Four times a day (QID) | RECTAL | Status: DC | PRN

## 2013-03-27 MED ORDER — FUROSEMIDE 20 MG PO TABS
20.0000 mg | ORAL_TABLET | Freq: Every day | ORAL | Status: DC
  Administered 2013-03-28: 20 mg via ORAL
  Filled 2013-03-27: qty 1

## 2013-03-27 MED ORDER — ACETAMINOPHEN 325 MG PO TABS: 650.0000 mg | ORAL_TABLET | Freq: Four times a day (QID) | ORAL | Status: DC | PRN

## 2013-03-27 MED ORDER — CHLORHEXIDINE GLUCONATE CLOTH 2 % EX PADS
6.0000 | MEDICATED_PAD | Freq: Every day | CUTANEOUS | Status: DC
  Administered 2013-03-28: 6 via TOPICAL

## 2013-03-27 MED ORDER — METOPROLOL TARTRATE 25 MG PO TABS
25.0000 mg | ORAL_TABLET | Freq: Two times a day (BID) | ORAL | Status: DC
  Administered 2013-03-27 – 2013-03-28 (×2): 25 mg via ORAL
  Filled 2013-03-27 (×3): qty 1

## 2013-03-27 MED ORDER — PANTOPRAZOLE SODIUM 40 MG PO TBEC
80.0000 mg | DELAYED_RELEASE_TABLET | Freq: Every day | ORAL | Status: DC
  Administered 2013-03-28: 80 mg via ORAL
  Filled 2013-03-27: qty 2

## 2013-03-27 MED ORDER — ATORVASTATIN CALCIUM 10 MG PO TABS
10.0000 mg | ORAL_TABLET | Freq: Every day | ORAL | Status: DC
  Administered 2013-03-28: 10 mg via ORAL
  Filled 2013-03-27: qty 1

## 2013-03-27 MED ORDER — POLYVINYL ALCOHOL 1.4 % OP SOLN
1.0000 [drp] | Freq: Every day | OPHTHALMIC | Status: DC
  Administered 2013-03-27 – 2013-03-28 (×7): 1 [drp] via OPHTHALMIC
  Filled 2013-03-27: qty 15

## 2013-03-27 MED ORDER — MUPIROCIN 2 % EX OINT
1.0000 "application " | TOPICAL_OINTMENT | Freq: Two times a day (BID) | CUTANEOUS | Status: DC
  Administered 2013-03-27 – 2013-03-28 (×2): 1 via NASAL
  Filled 2013-03-27: qty 22

## 2013-03-27 MED ORDER — HEPARIN SODIUM (PORCINE) 5000 UNIT/ML IJ SOLN
5000.0000 [IU] | Freq: Three times a day (TID) | INTRAMUSCULAR | Status: DC
  Administered 2013-03-27 – 2013-03-28 (×3): 5000 [IU] via SUBCUTANEOUS
  Filled 2013-03-27 (×5): qty 1

## 2013-03-27 MED ORDER — LATANOPROST 0.005 % OP SOLN
1.0000 [drp] | Freq: Every day | OPHTHALMIC | Status: DC
  Administered 2013-03-27: 1 [drp] via OPHTHALMIC
  Filled 2013-03-27: qty 2.5

## 2013-03-27 MED ORDER — POLYETHYL GLYCOL-PROPYL GLYCOL 0.4-0.3 % OP SOLN: 1.0000 [drp] | Freq: Every day | OPHTHALMIC | Status: DC

## 2013-03-27 MED ORDER — FERROUS SULFATE 325 (65 FE) MG PO TABS
325.0000 mg | ORAL_TABLET | Freq: Every day | ORAL | Status: DC
  Administered 2013-03-28: 325 mg via ORAL
  Filled 2013-03-27 (×2): qty 1

## 2013-03-27 MED ORDER — ONDANSETRON HCL 4 MG PO TABS: 4.0000 mg | ORAL_TABLET | Freq: Four times a day (QID) | ORAL | Status: DC | PRN

## 2013-03-27 MED ORDER — ONDANSETRON HCL 4 MG/2ML IJ SOLN: 4.0000 mg | Freq: Four times a day (QID) | INTRAMUSCULAR | Status: DC | PRN

## 2013-03-27 MED ORDER — VENLAFAXINE HCL 75 MG PO TABS
75.0000 mg | ORAL_TABLET | Freq: Two times a day (BID) | ORAL | Status: DC
  Administered 2013-03-27 – 2013-03-28 (×2): 75 mg via ORAL
  Filled 2013-03-27 (×3): qty 1

## 2013-03-27 MED ORDER — FLUDROCORTISONE ACETATE 0.1 MG PO TABS
0.1000 mg | ORAL_TABLET | Freq: Two times a day (BID) | ORAL | Status: DC
  Administered 2013-03-27 – 2013-03-28 (×2): 0.1 mg via ORAL
  Filled 2013-03-27 (×3): qty 1

## 2013-03-27 MED ORDER — DIGOXIN 250 MCG PO TABS
250.0000 ug | ORAL_TABLET | Freq: Every day | ORAL | Status: DC
  Administered 2013-03-28: 250 ug via ORAL
  Filled 2013-03-27: qty 1

## 2013-03-27 MED ORDER — CLOPIDOGREL BISULFATE 75 MG PO TABS
75.0000 mg | ORAL_TABLET | Freq: Every day | ORAL | Status: DC
  Administered 2013-03-28: 75 mg via ORAL
  Filled 2013-03-27: qty 1

## 2013-03-27 MED ORDER — OCUVITE PO TABS
1.0000 | ORAL_TABLET | Freq: Every day | ORAL | Status: DC
  Administered 2013-03-28: 1 via ORAL
  Filled 2013-03-27: qty 1

## 2013-03-27 NOTE — Progress Notes (Addendum)
Lonia Skinner from lab called to notify RN pts MRSA PCR came back positive. Maren Reamer, NP notified.

## 2013-03-27 NOTE — ED Notes (Addendum)
This patient encounter was entered under the wrong patient.  Epic was notified.  Charts cannot be "merged" because both are real patients.  The identity team is working on getting this fixed.  The patient's real name and birthday is [name] [name] [birth year]

## 2013-03-27 NOTE — ED Notes (Signed)
[id number]. ID number not crossing over.

## 2013-03-27 NOTE — ED Notes (Signed)
CBG was 103, tested upon arrival.

## 2013-03-27 NOTE — ED Provider Notes (Signed)
CSN: [id number]     Arrival date & time 03/27/13  1011 History   First MD Initiated Contact with Patient 03/27/13 1017     Chief Complaint  Patient presents with  . Hypoglycemia   (Consider location/radiation/quality/duration/timing/severity/associated sxs/prior Treatment) Patient is a 79 y.o. male presenting with hypoglycemia.  Hypoglycemia Initial blood sugar:  51 Blood sugar after intervention:  42 Severity:  Mild Onset quality:  Sudden Duration:  3 hours Timing:  Constant Progression:  Unchanged Chronicity:  New Diabetic status:  Controlled with oral medications Current diabetic therapy:  Metformin Context comment:  Routine BS check at facility Relieved by:  Eating and oral glucose Ineffective treatments:  Eating Associated symptoms: no shortness of breath and no vomiting     No past medical history on file. No past surgical history on file. No family history on file. History  Substance Use Topics  . Smoking status: Not on file  . Smokeless tobacco: Not on file  . Alcohol Use: Not on file    Review of Systems  Constitutional: Negative for fever.  HENT: Negative for drooling and rhinorrhea.   Eyes: Negative for pain.  Respiratory: Negative for cough and shortness of breath.   Cardiovascular: Negative for chest pain and leg swelling.  Gastrointestinal: Negative for nausea, vomiting, abdominal pain and diarrhea.  Genitourinary: Negative for dysuria and hematuria.  Musculoskeletal: Negative for gait problem and neck pain.  Skin: Negative for color change.  Neurological: Negative for numbness and headaches.  Hematological: Negative for adenopathy.  Psychiatric/Behavioral: Negative for behavioral problems.  All other systems reviewed and are negative.    Allergies  Sulfa antibiotics  Home Medications   Current Outpatient Rx  Name  Route  Sig  Dispense  Refill  . acetaminophen (TYLENOL) 500 MG tablet   Oral   Take 500 mg by mouth every 6 (six) hours as  needed.         . beta carotene w/minerals (OCUVITE) tablet   Oral   Take 1 tablet by mouth daily.         . brimonidine (ALPHAGAN) 0.2 % ophthalmic solution   Both Eyes   Place 1 drop into both eyes 2 (two) times daily.         . fludrocortisone (FLORINEF) 0.1 MG tablet   Oral   Take 0.1 mg by mouth 2 (two) times daily.         Marland Kitchen lisinopril (PRINIVIL,ZESTRIL) 2.5 MG tablet   Oral   Take 2.5 mg by mouth daily.         . metFORMIN (GLUCOPHAGE) 500 MG tablet   Oral   Take 250 mg by mouth daily.         . metoprolol tartrate (LOPRESSOR) 25 MG tablet   Oral   Take 25 mg by mouth 2 (two) times daily.         Marland Kitchen omeprazole (PRILOSEC) 20 MG capsule   Oral   Take 20 mg by mouth daily.         . potassium chloride (KLOR-CON) 20 MEQ packet   Oral   Take 20 mEq by mouth daily.         Marland Kitchen venlafaxine (EFFEXOR) 37.5 MG tablet   Oral   Take 37.5 mg by mouth daily.          BP 118/61  Pulse 76  Temp(Src) 97.8 F (36.6 C) (Oral)  Resp 18  SpO2 99% Physical Exam  Nursing note and vitals reviewed. Constitutional: He appears well-developed  and well-nourished.  HENT:  Head: Normocephalic and atraumatic.  Right Ear: External ear normal.  Left Ear: External ear normal.  Nose: Nose normal.  Mouth/Throat: Oropharynx is clear and moist. No oropharyngeal exudate.  Eyes: Conjunctivae and EOM are normal. Pupils are equal, round, and reactive to light.  Neck: Normal range of motion. Neck supple.  Cardiovascular: Normal rate, regular rhythm, normal heart sounds and intact distal pulses.  Exam reveals no gallop and no friction rub.   No murmur heard. Pulmonary/Chest: Effort normal and breath sounds normal. No respiratory distress. He has no wheezes.  1cm x 1cm erythematous irregular papular lesion noted on central chest.   Abdominal: Soft. Bowel sounds are normal. He exhibits no distension. There is no tenderness. There is no rebound and no guarding.  Musculoskeletal:  Normal range of motion. He exhibits no edema and no tenderness.  Neurological: He is alert.  A/o x 2. Does not know year.   Skin: Skin is warm and dry.  Psychiatric: He has a normal mood and affect. His behavior is normal.    ED Course  Procedures (including critical care time) Labs Review Labs Reviewed  GLUCOSE, CAPILLARY - Abnormal; Notable for the following:    Glucose-Capillary 103 (*)    All other components within normal limits  CBC WITH DIFFERENTIAL - Abnormal; Notable for the following:    RBC 4.05 (*)    Hemoglobin 11.9 (*)    HCT 36.8 (*)    Platelets 147 (*)    Lymphs Abs 0.6 (*)    All other components within normal limits  COMPREHENSIVE METABOLIC PANEL - Abnormal; Notable for the following:    Glucose, Bld 120 (*)    BUN 27 (*)    Albumin 3.2 (*)    GFR calc non Af Amer 54 (*)    GFR calc Af Amer 63 (*)    All other components within normal limits  URINALYSIS W MICROSCOPIC + REFLEX CULTURE - Abnormal; Notable for the following:    Protein, ur 30 (*)    Urobilinogen, UA 2.0 (*)    Casts HYALINE CASTS (*)    All other components within normal limits  GLUCOSE, CAPILLARY - Abnormal; Notable for the following:    Glucose-Capillary 47 (*)    All other components within normal limits  GLUCOSE, CAPILLARY - Abnormal; Notable for the following:    Glucose-Capillary 56 (*)    All other components within normal limits  GLUCOSE, CAPILLARY - Abnormal; Notable for the following:    Glucose-Capillary 133 (*)    All other components within normal limits  GLUCOSE, CAPILLARY   Imaging Review No results found.  EKG Interpretation   None       MDM   1. Hypoglycemia    10:33 AM 79 y.o. male who presents with hyperglycemia. I spoke with Norman Clay, a nurse at his facility. She states that he had blood sugar of 51 on routine check at 745 this morning. She states that she gave him orange juice and a sandwich. Upon recheck his blood sugar was 42. She states that she gave him  glucose gel. Upon recheck his blood sugar was 32. EMS was called. Reportedly a CBG from his finger was 20 but from an IV in his antecubital fossae it was 151. Initial blood sugar here is 103. He is afebrile and vital signs are unremarkable. He denies any pain or other complaints. Will get screening labwork and give by mouth. We'll continue to watch blood sugars.  Pt  ate half of a sandwich and drank here on exam. He again became hypoglycemic approx 1 hr later although not symptomatic. The daughter now here notes he had similar sx this time last year. Will admit for obs d/t persistent hypoglycemia.     Junius Argyle, MD 03/27/13 [phone]

## 2013-03-27 NOTE — ED Notes (Signed)
Bed: RESA Expected date: 03/27/13 Expected time: 9:45 AM Means of arrival: Ambulance Comments: Hypoglymia

## 2013-03-27 NOTE — H&P (Signed)
[facility] History and Physical  [name] [name]:[id number] DOB: [birth year] DOA: 03/27/2013   PCP: Florentina Jenny, MD   Chief Complaint: Hypoglycemia  HPI:  79-year-old male with a history of chronic atrial fibrillation, CAD, systolic CHF, MI, diabetes mellitus type 2, CKD stage III, PPM for SA node dysfunction presents from his nursing facility due to hypoglycemia.  The patient has cognitive impairment. He is a poor historian. His history is obtained from speaking with the EDP who spoke with RN at nursing facility. Patient had blood sugar of 51 on routine check at 745 this morning. She states that she gave him orange juice and a sandwich. Upon recheck his blood sugar was 42. She states that she gave him glucose gel. Upon recheck his blood sugar was 32. EMS was called. Reportedly a CBG from his finger was 20 but from an IV in his antecubital fossae it was 151. Initial blood sugar in ED is 103 from a fingerstick. Serum glucose on BMP was 120. The patient states that he intermittently has some symptoms of hypoglycemia which he describes as feeling "funny all over". Upon further questioning, he states that he occasionally feels jittery and lightheaded. In reviewing the patient's CBG checks at the nursing facility, the patient has had intermittent low CBGs in the 60s in the past month. In fact, the patient's metformin was decreased from 500 mg to 250 mg daily on 02/26/2013. Currently, the patient denies any dizziness, visual disturbance, headache, nausea, vomiting, diarrhea, abdominal pain, dysuria. There has been no fevers or chills, chest discomfort, shortness of breath.  In the ED, subsequent blood sugar checks were noted to be 47 and 56 even after eating half of a sandwich. Because of continued hypoglycemia observation admission was advised. BMP showed serum creatinine 1.18. Hepatic panel was negative. CBC showed platelets 147,000. The patient was afebrile and hemodynamically  stable. Assessment/Plan: Hypoglycemia -Not convince he is having true hypoglycemia with all previous CBG checks--in fact, pt asymptomatic on many occasions (may be due to BB) -Pt has very cold fingers with acrocyanosis which may be causing falsely low CBG checks -when his fingers were warmed up in ED his CBG was 139, but this may also be from recent sandwich in ED -Will monitor CBGs and advise staff to keep fingers warm -If patient experiences hypoglycemia--check serum glucose concurrently to verify. -d/c metformin--his estimated CrCl= 33 -hemoglobin A1C--at pt's age and ADA recommendations, he may not need any agents -If there is no clear etiology, and patient continues to satisfy [facility], may evaluate for insulinoma -am cortisol -CBG q 1hr then decrease frequency if ok Chronic atrial fibrillation -rate controlled -Continue metoprolol tartrate -Continue digoxin -Check digoxin level Chronic systolic CHF -Appears compensated -EF 45-50% CAD with history of MI -Continue Plavix CKD stage III -Baseline creatinine 1-1.2 -Continue furosemide Hypertension -Controlled -Continue metoprolol tartrate -Hold lisinopril for now       Past Medical History  Diagnosis Date  . Cardiac pacemaker in situ     s/p Viatron DDD Pacemaker for sinus node dysfunction  . Orthostatic hypotension   . Hyperlipidemia     mixed  . Atrial fibrillation     not felt to be a coumadin candidate due to falls  . Sinoatrial node dysfunction     s/p PPM (MDT)  . Coronary atherosclerosis of native coronary artery     s/p AWMI 2005 stent LAD  . Diabetes mellitus, type 2   . Chronic renal insufficiency, stage II (mild)   . Intolerance,  drug     amniodarone  . Upper GI hemorrhage     2006 Mallory Weiss Tear  . MI (myocardial infarction)     coronary artery status post anterior wall myocardial infarction in 2005 treated with a stent to the left anterior descending.  . Dizziness     recurrent  dizziness and falls  . Diabetes mellitus   . Hyperlipidemia   . Renal insufficiency     mild  . Dementia   . Depression   . CHF (congestive heart failure)   . GERD (gastroesophageal reflux disease)    Past Surgical History  Procedure Laterality Date  . Pacemaker placement    . Coronary stent placement     Social History:  reports that he quit smoking about 53 years ago. He does not have any smokeless tobacco history on file. He reports that he does not drink alcohol or use illicit drugs.   Family History  Problem Relation Age of Onset  . Coronary artery disease Brother   . Diabetes Mother      Allergies  Allergen Reactions  . Sulfonamide Derivatives     REACTION: hives  . Amiodarone Hcl Rash      Prior to Admission medications   Medication Sig Start Date End Date Taking? Authorizing Provider  acetaminophen (TYLENOL) 500 MG tablet Take 1,000 mg by mouth every 6 (six) hours as needed. pain   Yes Historical Provider, MD  Artificial Tear Ointment (REFRESH LACRI-LUBE) OINT Apply 1 application to eye 3 (three) times daily as needed. 1/2 inch -  For discomfort   Yes Historical Provider, MD  atorvastatin (LIPITOR) 10 MG tablet Take 10 mg by mouth daily.   Yes Historical Provider, MD  beta carotene w/minerals (OCUVITE) tablet Take 1 tablet by mouth daily.   Yes Historical Provider, MD  brimonidine (ALPHAGAN) 0.15 % ophthalmic solution Place 1 drop into the right eye 2 (two) times daily.   Yes Historical Provider, MD  clopidogrel (PLAVIX) 75 MG tablet Take 75 mg by mouth daily.     Yes Historical Provider, MD  dextrose (GLUTOSE) 40 % GEL Take 1 Tube by mouth once as needed. For glucose levels < 70   Yes Historical Provider, MD  digoxin (LANOXIN) 0.25 MG tablet Take 250 mcg by mouth daily.   Yes Historical Provider, MD  ferrous sulfate 325 (65 FE) MG tablet Take 325 mg by mouth daily with breakfast.     Yes Historical Provider, MD  fludrocortisone (FLORINEF) 0.1 MG tablet Take 0.1  mg by mouth 2 (two) times daily.    Yes Historical Provider, MD  furosemide (LASIX) 20 MG tablet Take 20 mg by mouth daily.   Yes Historical Provider, MD  latanoprost (XALATAN) 0.005 % ophthalmic solution Place 1 drop into both eyes at bedtime.   Yes Historical Provider, MD  lisinopril (PRINIVIL,ZESTRIL) 2.5 MG tablet Take 2.5 mg by mouth daily.   Yes Historical Provider, MD  metFORMIN (GLUCOPHAGE) 500 MG tablet Take 250 mg by mouth daily with breakfast.    Yes Historical Provider, MD  metoprolol tartrate (LOPRESSOR) 25 MG tablet Take 25 mg by mouth 2 (two) times daily.   Yes Historical Provider, MD  omeprazole (PRILOSEC) 40 MG capsule Take 40 mg by mouth daily.   Yes Historical Provider, MD  Polyethyl Glycol-Propyl Glycol (SYSTANE) 0.4-0.3 % SOLN Apply 1 drop to eye 6 (six) times daily.    Yes Historical Provider, MD  polyvinyl alcohol (LIQUIFILM TEARS) 1.4 % ophthalmic solution Place 1 drop  into both eyes 6 (six) times daily.   Yes Historical Provider, MD  potassium chloride SA (K-DUR,KLOR-CON) 20 MEQ tablet Take 20 mEq by mouth daily.     Yes Historical Provider, MD  venlafaxine (EFFEXOR) 75 MG tablet Take 75 mg by mouth 2 (two) times daily.   Yes Historical Provider, MD    Review of Systems:  Constitutional:  No weight loss, night sweats, Fevers, chills, fatigue.  Head&Eyes: No headache.  No vision loss.  No eye pain or scotoma ENT:  No Difficulty swallowing,Tooth/dental problems,Sore throat,    Cardio-vascular:  No chest pain, Orthopnea, PND, swelling in lower extremities, palpitations  GI:  No  abdominal pain, nausea, vomiting, diarrhea, loss of appetite, hematochezia, melena, heartburn, indigestion, Resp:  No shortness of breath with exertion or at rest. No cough. No coughing up of blood  Skin:  no rash or lesions.  GU:  no dysuria, change in color of urine, no urgency or frequency. No flank pain.  Musculoskeletal:  No joint pain or swelling. No decreased range of motion. No  back pain.  Psych:  No change in mood or affect. No depression or anxiety. Neurologic: No headache, no dysesthesia, no focal weakness, no vision loss. No syncope  Physical Exam: VS:  97.8--76-16-118/61--99% RA General:  A&O x 2, NAD, nontoxic, pleasant/cooperative Head/Eye: No conjunctival hemorrhage, no icterus, Wallace/AT, No nystagmus ENT:  No icterus,  No thrush, poor dentition, no pharyngeal exudate Neck:  No masses, no lymphadenpathy, CV:  IRRR, no rub, no gallop, no S3 Lung:  CTAB, good air movement, no wheeze, no rhonchi Abdomen: soft/NT, +BS, nondistended, no peritoneal signs Ext:  No rashes, No petechiae, No lymphangitis, No edema; cold fingers with acrocyanosis   Labs on Admission:  Basic Metabolic Panel: No results found for this basename: NA, K, CL, CO2, GLUCOSE, BUN, CREATININE, CALCIUM, MG, PHOS,  in the last 168 hours Liver Function Tests: No results found for this basename: AST, ALT, ALKPHOS, BILITOT, PROT, ALBUMIN,  in the last 168 hours No results found for this basename: LIPASE, AMYLASE,  in the last 168 hours No results found for this basename: AMMONIA,  in the last 168 hours CBC: No results found for this basename: WBC, NEUTROABS, HGB, HCT, MCV, PLT,  in the last 168 hours Cardiac Enzymes: No results found for this basename: CKTOTAL, CKMB, CKMBINDEX, TROPONINI,  in the last 168 hours BNP: No components found with this basename: POCBNP,  CBG: No results found for this basename: GLUCAP,  in the last 168 hours  Radiological Exams on Admission: No results found.     Time spent:60 minutes Code Status:   DNR Family Communication: No  Family at bedside   Kennie Snedden, DO  [facility] Pager [phone]  If 7PM-7AM, please contact night-coverage [email or web address] Password [facility] 03/27/2013, 2:56 PM

## 2013-03-27 NOTE — Progress Notes (Signed)
Utilization Review completed.  Zebbie Ace RN CM  

## 2013-03-27 NOTE — Progress Notes (Signed)
   CARE MANAGEMENT ED NOTE 03/27/2013  Patient:  [name] [name], [name] [name]   Account Number:  [id number]  Date Initiated:  03/27/2013  Documentation initiated by:  Edd Arbour  Subjective/Objective Assessment:   80 yr old male bcbs [place] resident of [facility]  c/o hypoglycemic, finger tips purple CBG read at 20 then 151,given OJ, sandwich and 2 oral glucose tabs In [facility] ED glucose 103, 91     Subjective/Objective Assessment Detail:   pcp per [facility] information sheets is Dr Florentina Jenny     Action/Plan:   Action/Plan Detail:   EPIC updated   Anticipated DC Date:  03/27/2013     Status Recommendation to Physician:   Result of Recommendation:    Other ED Services  Consult Working Plan    DC Planning Services  Other  PCP issues  Outpatient Services - Pt will follow up    Choice offered to / List presented to:            Status of service:  Completed, signed off  ED Comments:   ED Comments Detail:

## 2013-03-27 NOTE — ED Notes (Signed)
Per EMS- Patient is a resident of [facility]. Staff called and stated that the patient was hypoglycemic. Patient's fingertips are purple and states he can not feel his fingertips. CBG from finger was 20. EMS started an IV and used the blood from the IV site and CBG was 151. Staff at [facility] gave the patient OJ, PBJ sandwich and 2 oral glucose tabs prior to EMS arrival. Patient has a history of dementia.

## 2013-03-28 DIAGNOSIS — N183 Chronic kidney disease, stage 3 unspecified: Secondary | ICD-10-CM

## 2013-03-28 LAB — BASIC METABOLIC PANEL
BUN: 33 mg/dL — ABNORMAL HIGH (ref 6–23)
CO2: 25 mEq/L (ref 19–32)
Calcium: 8.8 mg/dL (ref 8.4–10.5)
Chloride: 104 mEq/L (ref 96–112)
Creatinine, Ser: 1.15 mg/dL (ref 0.50–1.35)
GFR calc Af Amer: 64 mL/min — ABNORMAL LOW (ref >90)
GFR calc non Af Amer: 55 mL/min — ABNORMAL LOW (ref >90)
Glucose, Bld: 110 mg/dL — ABNORMAL HIGH (ref 70–99)
Potassium: 3.9 mEq/L (ref 3.7–5.3)
Sodium: 140 mEq/L (ref 137–147)

## 2013-03-28 LAB — GLUCOSE, CAPILLARY
GLUCOSE-CAPILLARY: 113 mg/dL — AB (ref 70–99)
GLUCOSE-CAPILLARY: 87 mg/dL (ref 70–99)
Glucose-Capillary: 78 mg/dL (ref 70–99)
Glucose-Capillary: 74 mg/dL (ref 70–99)

## 2013-03-28 LAB — HEMOGLOBIN A1C
Hgb A1c MFr Bld: 6.3 % — ABNORMAL HIGH (ref <5.7)
Mean Plasma Glucose: 134 mg/dL — ABNORMAL HIGH (ref <117)

## 2013-03-28 NOTE — Progress Notes (Addendum)
CSW received another call from [facility] [phone], [name]. Pt may be able to return tonight, as pt has not been gone for 24 hours and as long as there is not any major medication changes. CSW spoke with MD who is working on the discharge information. CSW confirmed with nurse [name]. Per [name] they will need updated fl2 with medications. CSW will complete the fl2 for md signautre once meds for discharge are put into epic. CSW will email unsigned fl2 to nurse for review and signed fl2 will go with patient. CSW will email to [email or web address].   Catha Gosselin.Taunya Goral Stewart, LCSW on call csw pager # [phone]  .03/28/2013 1613pm

## 2013-03-28 NOTE — Progress Notes (Signed)
Clinical Social Work Department BRIEF PSYCHOSOCIAL ASSESSMENT 03/28/2013  Patient:  [name] [name],[name] [name]     Account Number:  [id number]     Admit date:  03/27/2013  Clinical Social Worker:  Doree AlbeeSTEWART,Tiberius Loftus, LCSWA  Date/Time:  03/28/2013 12:00 M  Referred by:  Physician  Date Referred:  03/28/2013 Referred for  ALF Placement   Other Referral:   Interview type:  Family Other interview type:    PSYCHOSOCIAL DATA Living Status:  FACILITY [name] Level of care:  Assisted Living Primary support name:  [name] [name] Primary support relationship to patient:  CHILD, ADULT Degree of support available:   strong    CURRENT CONCERNS Current Concerns  Post-Acute Placement   Other Concerns:    SOCIAL WORK ASSESSMENT / PLAN On call csw recieved referral regarding patient returning to assisted living facility. CSW with help from RN and Physician completed psychosocial assessment with pt daughter. Pt only alert to self. Pt daughter share and confirmed that [facility] and plans to return when medically stable. Patient is meidcally stable today per discussion with physician.    CSW completed fl2 for md signature and RN asissted with patient chart copy and arranging transportation.    CSW confirmed with [name], [facility] at [facility] that patient medications were correct and the facility was ready for patient. Per Ms. Ash RN to call report to [name].    RN provided with number. Pt daughter requested non emergency ambulance to transport patient back to facility.   Assessment/plan status:  Psychosocial Support/Ongoing Assessment of Needs Other assessment/ plan:   Information/referral to community resources:   none identified.    PATIENT'S/FAMILY'S RESPONSE TO PLAN OF CARE: Patient daughter thanked csw for concern and support. Patient transported back to assisted living.       [name], LCSW [phone]  ED CSW .03/28/2013  1726pm

## 2013-03-28 NOTE — Progress Notes (Signed)
CSW spoke with pt daughter, Ms. Molli Barrowsresnell who confirmed that [facility]. Patient daughter expressed that she wishes for patient to return by non emergency ems, and is aware that is not guaranteed that the insurance will pay 100% for transportation. Patient daughter thanked csw for concern and support. CSW confirmed with RN and provided RN with number to call report. Rn agreed to call [facility] to arrange non emergency transportation.   Catha GosselinKristen Stewart, LCSW on call csw pager # [phone] 03/28/2013 1721pm

## 2013-03-28 NOTE — Progress Notes (Signed)
On call csw received call from attending stating that patient was medically stable for discharge back to assisted living. CSW spoke with Luna [facility]Latasha at [facility] who stated patient would not be able to return until patient is assessed by ALF nurse. Luna KitchensLatasha was going to call the nurse to see if something could be scheduled and will call this writer back if she is able to assess today however she is expecting patient will not be assessed until tomorrow. CSW informed MD.   .Catha GosselinKristen Stewart, LCSW [phone]  ED CSW .03/28/2013 1536pm

## 2013-03-28 NOTE — Progress Notes (Signed)
Attempted to [facility] x2  [facility]. receiving recording and unable to leave message because it says, "that mailbox is full". RN will call for transportation and daughter was notified of patients discharge back to facility. FL2 and AVS in chart. Will continue to [facility] and reach [facility]. J.Yann Biehn, RN

## 2013-03-28 NOTE — Discharge Summary (Addendum)
Physician Discharge Summary  Hinda LenisHoward H Haithcock WUJ:[id number]:[id number] DOB: 08-02-1922 DOA: 03/27/2013  PCP: Florentina JennyRIPP, HENRY, MD  Admit date: 03/27/2013 Discharge date: 03/28/2013  Recommendations for Outpatient Follow-up:  1. Pt will need to follow up with PCP in 2 weeks post discharge 2. Please obtain BMP to evaluate electrolytes and kidney function 3. Would recommend checking the patient's digoxin level within the next week    Discharge Diagnoses:  Active Problems:   CAD, NATIVE VESSEL   Chronic atrial fibrillation   Hypoglycemia   CKD (chronic kidney disease) stage 3, GFR 30-59 ml/min   DM2 (diabetes mellitus, type 2)   Chronic systolic CHF (congestive heart failure) Hypoglycemia  -Not convince he is having true hypoglycemia with all previous CBG checks--in fact, pt asymptomatic on many occasions (may be due to BB)  -Pt has very cold fingers with acrocyanosis which may be causing falsely low CBG checks  -daughter also related pt having hx of Raynaud's phenomenon -when his fingers were warmed up in ED his CBG was 139 -monitor CBGs and advise staff to keep fingers warm  -If patient experiences hypoglycemia--check serum glucose concurrently to verify.  -d/c metformin--his estimated CrCl= 33  -Because of his creatinine clearance and also in accordance with his age and American diabetic Association recommendations, I will not restart metformin at the time of discharge -Patient's hemoglobin A1c 6.3--on 03/27/2013 -at pt's age and ADA recommendations, will not restart any DM agents  -If there is no clear etiology, and patient continues to satisfy Whipple's triad, may evaluate for insulinoma  -am cortisol--please followup--results pending at time of d/c -CBG q 1hr--did not show any hypoglycemia -The patient CBG checks were changed to q ac&hs -The patient did not have any further episodes of hypoglycemia during the hospitalization -The patient's mentation remained at baseline and he ate without any  difficulty -his diet was liberalized Chronic atrial fibrillation  -rate controlled  -Continue metoprolol tartrate  -Continue digoxin  Chronic systolic CHF  -Appears compensated  -EF 45-50%  CAD with history of MI  -Continue Plavix  CKD stage III  -Baseline creatinine 1-1.2  -Continue furosemide  -At the patient's age, his estimated creatinine clearance is 33 -Serum creatinine 1.18 at the time of discharge Hypertension  -Controlled  -Continue metoprolol tartrate  -Hold lisinopril for now   Discharge Condition: Stable  Disposition: d/c back to [facility]  Diet: regular Wt Readings from Last 3 Encounters:  03/27/13 54.1 kg (119 lb 4.3 oz)  12/20/12 56.246 kg (124 lb)  03/07/12 56.564 kg (124 lb 11.2 oz)    History of present illness:  80 year old male with a history of chronic atrial fibrillation, CAD, systolic CHF, MI, diabetes mellitus type 2, CKD stage III, PPM for SA node dysfunction presents from his nursing facility due to hypoglycemia. The patient has cognitive impairment. He is a poor historian. His history is obtained from speaking with the EDP who spoke with RN at nursing facility. Patient had blood sugar of 51 on routine check at 745 this morning. She states that she gave him orange juice and a sandwich. Upon recheck his blood sugar was 42. She states that she gave him glucose gel. Upon recheck his blood sugar was 32. EMS was called. Reportedly a CBG from his finger was 20 but from an IV in his antecubital fossae it was 151. Initial blood sugar in ED is 103 from a fingerstick. Serum glucose on BMP was 120. The patient states that he intermittently has some symptoms of hypoglycemia which he describes  as feeling "funny all over". Upon further questioning, he states that he occasionally feels jittery and lightheaded. In reviewing the patient's CBG checks at the nursing facility, the patient has had intermittent low CBGs in the 60s in the past month. In fact, the patient's  metformin was decreased from 500 mg to 250 mg daily on 02/26/2013. Currently, the patient denies any dizziness, visual disturbance, headache, nausea, vomiting, diarrhea, abdominal pain, dysuria. There has been no fevers or chills, chest discomfort, shortness of breath.  In the ED, subsequent blood sugar checks were noted to be 47 and 56 even after eating half of a sandwich. Because of continued hypoglycemia observation admission was advised.  BMP showed serum creatinine 1.18. Hepatic panel was negative. CBC showed platelets 147,000. The patient was afebrile and hemodynamically stable.     Discharge Exam: Filed Vitals:   03/28/13 1450  BP: 126/80  Pulse: 93  Temp: 98.4 F (36.9 C)  Resp: 18   Filed Vitals:   03/27/13 1659 03/27/13 2155 03/28/13 0600 03/28/13 1450  BP: 136/98 123/73 121/70 126/80  Pulse: 95 95 88 93  Temp: 97.9 F (36.6 C) 97.5 F (36.4 C) 98 F (36.7 C) 98.4 F (36.9 C)  TempSrc: Oral Oral Oral Oral  Resp: 18 24 20 18   Height: 5\' 8"  (1.727 m)     Weight: 54.1 kg (119 lb 4.3 oz)     SpO2: 96% 97% 98% 96%   General: A&O x 3, NAD, pleasant, cooperative Cardiovascular: IRRR, no rub, no gallop, no S3 Respiratory: CTAB, no wheeze, no rhonchi Abdomen:soft, nontender, nondistended, positive bowel sounds Extremities: No edema, No lymphangitis, no petechiae  Discharge Instructions      Discharge Orders   Future Orders Complete By Expires   Diet general  As directed    Discharge instructions  As directed    Comments:     Liberalize diet. Do Not restart metformin or lisinopril   Increase activity slowly  As directed        Medication List    STOP taking these medications       lisinopril 2.5 MG tablet  Commonly known as:  PRINIVIL,ZESTRIL     metFORMIN 500 MG tablet  Commonly known as:  GLUCOPHAGE      TAKE these medications       acetaminophen 500 MG tablet  Commonly known as:  TYLENOL  Take 1,000 mg by mouth every 6 (six) hours as needed. pain      atorvastatin 10 MG tablet  Commonly known as:  LIPITOR  Take 10 mg by mouth daily.     beta carotene w/minerals tablet  Take 1 tablet by mouth daily.     brimonidine 0.15 % ophthalmic solution  Commonly known as:  ALPHAGAN  Place 1 drop into the right eye 2 (two) times daily.     clopidogrel 75 MG tablet  Commonly known as:  PLAVIX  Take 75 mg by mouth daily.     dextrose 40 % Gel  Commonly known as:  GLUTOSE  Take 1 Tube by mouth once as needed. For glucose levels < 70     digoxin 0.25 MG tablet  Commonly known as:  LANOXIN  Take 250 mcg by mouth daily.     ferrous sulfate 325 (65 FE) MG tablet  Take 325 mg by mouth daily with breakfast.     fludrocortisone 0.1 MG tablet  Commonly known as:  FLORINEF  Take 0.1 mg by mouth 2 (two) times daily.  furosemide 20 MG tablet  Commonly known as:  LASIX  Take 20 mg by mouth daily.     latanoprost 0.005 % ophthalmic solution  Commonly known as:  XALATAN  Place 1 drop into both eyes at bedtime.     metoprolol tartrate 25 MG tablet  Commonly known as:  LOPRESSOR  Take 25 mg by mouth 2 (two) times daily.     omeprazole 40 MG capsule  Commonly known as:  PRILOSEC  Take 40 mg by mouth daily.     polyvinyl alcohol 1.4 % ophthalmic solution  Commonly known as:  LIQUIFILM TEARS  Place 1 drop into both eyes 6 (six) times daily.     potassium chloride SA 20 MEQ tablet  Commonly known as:  K-DUR,KLOR-CON  Take 20 mEq by mouth daily.     REFRESH LACRI-LUBE Oint  Apply 1 application to eye 3 (three) times daily as needed. 1/2 inch -  For discomfort     SYSTANE 0.4-0.3 % Soln  Generic drug:  Polyethyl Glycol-Propyl Glycol  Apply 1 drop to eye 6 (six) times daily.     venlafaxine 75 MG tablet  Commonly known as:  EFFEXOR  Take 75 mg by mouth 2 (two) times daily.         The results of significant diagnostics from this hospitalization (including imaging, microbiology, ancillary and laboratory) are listed below for  reference.    Significant Diagnostic Studies: No results found.   Microbiology: Recent Results (from the past 240 hour(s))  MRSA PCR SCREENING     Status: Abnormal   Collection Time    03/27/13  5:45 PM      Result Value Range Status   MRSA by PCR POSITIVE (*) NEGATIVE Final   Comment:            The GeneXpert MRSA Assay (FDA     approved for NASAL specimens     only), is one component of a     comprehensive MRSA colonization     surveillance program. It is not     intended to diagnose MRSA     infection nor to guide or     monitor treatment for     MRSA infections.     RESULT CALLED TO, READ BACK BY AND VERIFIED WITH:     Jari Sportsman ON [id number] AT 1942 BY INGLEE     Labs: Basic Metabolic Panel:  Recent Labs Lab 03/27/13 1100 03/28/13 0410  NA 141 140  K 4.0 3.9  CL 103 104  CO2 29 25  GLUCOSE 120* 110*  BUN 27* 33*  CREATININE 1.18 1.15  CALCIUM 8.7 8.8   Liver Function Tests:  Recent Labs Lab 03/27/13 1100  AST 13  ALT 8  ALKPHOS 79  BILITOT 1.2  PROT 6.6  ALBUMIN 3.2*   No results found for this basename: LIPASE, AMYLASE,  in the last 168 hours No results found for this basename: AMMONIA,  in the last 168 hours CBC:  Recent Labs Lab 03/27/13 1100  WBC 5.0  NEUTROABS 3.8  HGB 11.9*  HCT 36.8*  MCV 90.9  PLT 147*   Cardiac Enzymes: No results found for this basename: CKTOTAL, CKMB, CKMBINDEX, TROPONINI,  in the last 168 hours BNP: No components found with this basename: POCBNP,  CBG:  Recent Labs Lab 03/27/13 2030 03/28/13 0053 03/28/13 0521 03/28/13 0737 03/28/13 1213  GLUCAP 176* 113* 78 74 87    Time coordinating discharge:  Greater than 30 minutes  Signed:  Kalyiah Saintil, DO [facility] Pager: [phone] 03/28/2013, 4:30 PM

## 2013-03-28 NOTE — Progress Notes (Signed)
Attempted to call report to [facility] staff again, unable to reach anyone by telephone. J.Ayriana Wix

## 2013-03-29 LAB — CORTISOL-AM, BLOOD: Cortisol - AM: 14.9 ug/dL (ref 4.3–22.4)

## 2013-04-10 ENCOUNTER — Emergency Department (HOSPITAL_COMMUNITY)
Admission: EM | Admit: 2013-04-10 | Discharge: 2013-04-10 | Disposition: A | Discharge status: Home / Self Care | Payer: MEDICARE | Attending: Emergency Medicine | Admitting: Emergency Medicine

## 2013-04-10 ENCOUNTER — Encounter (HOSPITAL_COMMUNITY): Payer: Self-pay | Admitting: Emergency Medicine

## 2013-04-10 ENCOUNTER — Emergency Department (HOSPITAL_COMMUNITY): Payer: MEDICARE

## 2013-04-10 DIAGNOSIS — Z7902 Long term (current) use of antithrombotics/antiplatelets: Secondary | ICD-10-CM | POA: Insufficient documentation

## 2013-04-10 DIAGNOSIS — I2510 Atherosclerotic heart disease of native coronary artery without angina pectoris: Secondary | ICD-10-CM | POA: Insufficient documentation

## 2013-04-10 DIAGNOSIS — S61409A Unspecified open wound of unspecified hand, initial encounter: Secondary | ICD-10-CM | POA: Insufficient documentation

## 2013-04-10 DIAGNOSIS — IMO0002 Reserved for concepts with insufficient information to code with codable children: Secondary | ICD-10-CM | POA: Insufficient documentation

## 2013-04-10 DIAGNOSIS — E119 Type 2 diabetes mellitus without complications: Secondary | ICD-10-CM | POA: Insufficient documentation

## 2013-04-10 DIAGNOSIS — T148XXA Other injury of unspecified body region, initial encounter: Secondary | ICD-10-CM

## 2013-04-10 DIAGNOSIS — E785 Hyperlipidemia, unspecified: Secondary | ICD-10-CM | POA: Insufficient documentation

## 2013-04-10 DIAGNOSIS — Z87891 Personal history of nicotine dependence: Secondary | ICD-10-CM | POA: Insufficient documentation

## 2013-04-10 DIAGNOSIS — W19XXXA Unspecified fall, initial encounter: Secondary | ICD-10-CM | POA: Insufficient documentation

## 2013-04-10 DIAGNOSIS — Y939 Activity, unspecified: Secondary | ICD-10-CM | POA: Insufficient documentation

## 2013-04-10 DIAGNOSIS — I509 Heart failure, unspecified: Secondary | ICD-10-CM | POA: Insufficient documentation

## 2013-04-10 DIAGNOSIS — Y92129 Unspecified place in nursing home as the place of occurrence of the external cause: Secondary | ICD-10-CM

## 2013-04-10 DIAGNOSIS — I4891 Unspecified atrial fibrillation: Secondary | ICD-10-CM | POA: Insufficient documentation

## 2013-04-10 DIAGNOSIS — N183 Chronic kidney disease, stage 3 unspecified: Secondary | ICD-10-CM | POA: Insufficient documentation

## 2013-04-10 DIAGNOSIS — Z950 Presence of cardiac pacemaker: Secondary | ICD-10-CM | POA: Insufficient documentation

## 2013-04-10 DIAGNOSIS — I252 Old myocardial infarction: Secondary | ICD-10-CM | POA: Insufficient documentation

## 2013-04-10 DIAGNOSIS — Y9210 Unspecified residential institution as the place of occurrence of the external cause: Secondary | ICD-10-CM | POA: Insufficient documentation

## 2013-04-10 DIAGNOSIS — F3289 Other specified depressive episodes: Secondary | ICD-10-CM | POA: Insufficient documentation

## 2013-04-10 DIAGNOSIS — K219 Gastro-esophageal reflux disease without esophagitis: Secondary | ICD-10-CM | POA: Insufficient documentation

## 2013-04-10 DIAGNOSIS — S51009A Unspecified open wound of unspecified elbow, initial encounter: Secondary | ICD-10-CM | POA: Insufficient documentation

## 2013-04-10 DIAGNOSIS — Z9861 Coronary angioplasty status: Secondary | ICD-10-CM | POA: Insufficient documentation

## 2013-04-10 DIAGNOSIS — Z23 Encounter for immunization: Secondary | ICD-10-CM | POA: Insufficient documentation

## 2013-04-10 DIAGNOSIS — F329 Major depressive disorder, single episode, unspecified: Secondary | ICD-10-CM | POA: Insufficient documentation

## 2013-04-10 MED ORDER — TETANUS-DIPHTH-ACELL PERTUSSIS 5-2.5-18.5 LF-MCG/0.5 IM SUSP
0.5000 mL | Freq: Once | INTRAMUSCULAR | Status: AC
Start: 2013-04-10 — End: 2013-04-10
  Administered 2013-04-10: 0.5 mL via INTRAMUSCULAR
  Filled 2013-04-10: qty 0.5

## 2013-04-10 NOTE — ED Provider Notes (Signed)
CSN: [id number]     Arrival date & time 04/10/13  0250 History   First MD Initiated Contact with Patient 04/10/13 0327     Chief Complaint  Patient presents with  . Fall   (Consider location/radiation/quality/duration/timing/severity/associated sxs/prior Treatment) HPI History provided by patient, the EMS and nursing home record. Patient had unwitnessed fall in his room. He was able to ambulate to nursing station and was noted to have large skin tear to his left elbow and his right hand. Bandages were placed and patient was transported to the ER. He is unable to say how he fell. He denies any head injury or neck pain. No weakness or numbness. He takes Plavix/ history of heart disease.  He also has history of dementia and is a limited historian.   Past Medical History  Diagnosis Date  . Cardiac pacemaker in situ     s/p Viatron DDD Pacemaker for sinus node dysfunction  . Orthostatic hypotension   . Hyperlipidemia     mixed  . Atrial fibrillation     not felt to be a coumadin candidate due to falls  . Sinoatrial node dysfunction     s/p PPM (MDT)  . Coronary atherosclerosis of native coronary artery     s/p AWMI 2005 stent LAD  . Diabetes mellitus, type 2   . Chronic renal insufficiency, stage II (mild)   . Intolerance, drug     amniodarone  . Upper GI hemorrhage     2006 Mallory Weiss Tear  . MI (myocardial infarction)     coronary artery status post anterior wall myocardial infarction in 2005 treated with a stent to the left anterior descending.  . Dizziness     recurrent dizziness and falls  . Diabetes mellitus   . Hyperlipidemia   . Renal insufficiency     mild  . Dementia   . Depression   . CHF (congestive heart failure)   . GERD (gastroesophageal reflux disease)    Past Surgical History  Procedure Laterality Date  . Pacemaker placement    . Coronary stent placement     Family History  Problem Relation Age of Onset  . Coronary artery disease Brother   . Diabetes  Mother    History  Substance Use Topics  . Smoking status: Former Smoker    Quit date: 03/28/1960  . Smokeless tobacco: Not on file  . Alcohol Use: No    Review of Systems  Constitutional: Negative for diaphoresis and fatigue.  Eyes: Negative for visual disturbance.  Respiratory: Negative for shortness of breath.   Cardiovascular: Negative for chest pain.  Gastrointestinal: Negative for vomiting and abdominal pain.  Genitourinary: Negative for flank pain.  Musculoskeletal: Negative for back pain.  Skin: Positive for wound.  Neurological: Negative for weakness and headaches.  All other systems reviewed and are negative.    Allergies  Sulfa antibiotics; Sulfonamide derivatives; and Amiodarone hcl  Home Medications   Current Outpatient Rx  Name  Route  Sig  Dispense  Refill  . atorvastatin (LIPITOR) 10 MG tablet   Oral   Take 10 mg by mouth daily.         . beta carotene w/minerals (OCUVITE) tablet   Oral   Take 1 tablet by mouth daily.         . brimonidine (ALPHAGAN) 0.15 % ophthalmic solution   Right Eye   Place 1 drop into the right eye 2 (two) times daily.         .Marland Kitchen  clopidogrel (PLAVIX) 75 MG tablet   Oral   Take 75 mg by mouth daily.           . clotrimazole-betamethasone (LOTRISONE) cream   Topical   Apply 1 application topically daily. Apply to soles of feet         . digoxin (LANOXIN) 0.25 MG tablet   Oral   Take 250 mcg by mouth daily.         . ferrous sulfate 325 (65 FE) MG tablet   Oral   Take 325 mg by mouth daily with breakfast.           . fludrocortisone (FLORINEF) 0.1 MG tablet   Oral   Take 0.1 mg by mouth 2 (two) times daily.          . furosemide (LASIX) 20 MG tablet   Oral   Take 20 mg by mouth daily.         Marland Kitchen latanoprost (XALATAN) 0.005 % ophthalmic solution   Both Eyes   Place 1 drop into both eyes at bedtime.         . metoprolol tartrate (LOPRESSOR) 25 MG tablet   Oral   Take 25 mg by mouth 2 (two)  times daily.         Marland Kitchen omeprazole (PRILOSEC) 20 MG capsule   Oral   Take 20 mg by mouth daily.         Bertram Gala Glycol-Propyl Glycol (SYSTANE) 0.4-0.3 % SOLN   Ophthalmic   Apply 1 drop to eye 4 (four) times daily.          . potassium chloride SA (K-DUR,KLOR-CON) 20 MEQ tablet   Oral   Take 20 mEq by mouth daily.           Marland Kitchen venlafaxine (EFFEXOR) 37.5 MG tablet   Oral   Take 37.5 mg by mouth daily.         Marland Kitchen acetaminophen (TYLENOL) 500 MG tablet   Oral   Take 1,000 mg by mouth every 6 (six) hours as needed. pain         . Artificial Tear Ointment (REFRESH LACRI-LUBE) OINT   Ophthalmic   Apply 1 application to eye 3 (three) times daily as needed. 1/2 inch -  For discomfort         . dextrose (GLUTOSE) 40 % GEL   Oral   Take 1 Tube by mouth once as needed. For glucose levels < 70          BP 156/92  Pulse 64  Temp(Src) 98.7 F (37.1 C) (Oral)  Resp 17  SpO2 96% Physical Exam  Constitutional: He appears well-developed and well-nourished.  HENT:  Head: Normocephalic and atraumatic.  Eyes: EOM are normal. Pupils are equal, round, and reactive to light.  Neck: Neck supple.  No cervical spine tenderness or deformity  Cardiovascular: Normal rate, regular rhythm and intact distal pulses.   Pulmonary/Chest: Effort normal. No respiratory distress.  Abdominal: Soft. He exhibits no distension. There is no tenderness.  Musculoskeletal: Normal range of motion. He exhibits no edema.  Large skin tear over left elbow. Good range of motion and with no obvious bony deformity. Distal neurovascular intact. Nontender over her shoulder and wrist. Right hand with small skin tear over fifth metacarpal. No bony deformity. Distal neurovascular intact. Good range of motion throughout all major joints  Neurological: He is alert.  Skin: Skin is warm and dry.    ED Course  Procedures (including critical  care time) Labs Review Labs Reviewed - No data to display Imaging  Review Dg Elbow Complete Left  04/10/2013   CLINICAL DATA:  Fall with skin tear.  EXAM: LEFT ELBOW - COMPLETE 3+ VIEW  COMPARISON:  None.  FINDINGS: There is no evidence of fracture, dislocation, or joint effusion. There is soft tissue swelling around the olecranon correlating with history of soft tissue injury. No radiodense foreign body. Mild insertional changes at the supracondylar attachment of the forearm flexor compartment.  IMPRESSION: No acute osseous injury.  No radiodense foreign body.   Electronically Signed   By: Tiburcio Pea M.D.   On: 04/10/2013 04:27   Dg Knee Complete 4 Views Right  04/10/2013   CLINICAL DATA:  Fall with patellar abrasions.  EXAM: RIGHT KNEE - COMPLETE 4+ VIEW  COMPARISON:  None.  FINDINGS: There is no evidence of fracture, dislocation, or joint effusion. No significant degeneration for age. Arterial calcification. No radiodense foreign body.  IMPRESSION: No acute osseous abnormality.  No joint effusion.   Electronically Signed   By: Tiburcio Pea M.D.   On: 04/10/2013 04:25   Dg Hand Complete Right  04/10/2013   CLINICAL DATA:  Fall. Skin tear over the third through fifth metacarpals.  EXAM: RIGHT HAND - COMPLETE 3+ VIEW  COMPARISON:  None.  FINDINGS: Negative for acute fracture or malalignment. There is interphalangeal joint osteoarthritis diffusely, most advanced at the second and third distal interphalangeal joints. Arterial calcification  IMPRESSION: No acute osseous findings.   Electronically Signed   By: Tiburcio Pea M.D.   On: 04/10/2013 04:24   Wound care provided, Steri-Strips by an dressing was by skin tears. Tetanus updated.  Plan discharge back to facility with close primary care physician followup and medications.   MDM   1. Multiple skin tears   2. Fall at nursing home    X-rays reviewed as above no fractures. Serial evaluations without change in mental status. No evidence of head injury or trauma.  VS/  nurse's notes reviewed and  considered.    Sunnie Nielsen, MD 04/10/13 [phone]

## 2013-04-10 NOTE — ED Notes (Signed)
[facility] after his fall with skin injury.  Per EMS, staff at the facility called EMS and reported that pt has had an unwitnessed fall with skin tears to BUE; staff reported that pt fell in his room, got up and went out to the lobby and was observed pt bleeding from a "large skin tear " to his left arm--- per EMS, left arm injury was already bandaged on their arrival; pt also sustained skin tears to right arm and hand.  Pt presents to ED alert and verbally appropriate, bandages to BUE in place, bleeding controlled.

## 2013-04-10 NOTE — ED Notes (Signed)
Bed: WA15 Expected date:  Expected time:  Means of arrival:  Comments: EMS  

## 2013-04-10 NOTE — Discharge Instructions (Signed)
Skin Tear Care  A skin tear is a wound in which the top layer of skin has peeled off. This is a common problem with aging because the skin becomes thinner and more fragile as a person gets older. In addition, some medicines, such as oral corticosteroids, can lead to skin thinning if taken for long periods of time.   A skin tear is often repaired with tape or skin adhesive strips. This keeps the skin that has been peeled off in contact with the healthier skin beneath. Depending on the location of the wound, a bandage (dressing) may be applied over the tape or skin adhesive strips. Sometimes, during the healing process, the skin turns black and dies. Even when this happens, the torn skin acts as a good dressing until the skin underneath gets healthier and repairs itself.  HOME CARE INSTRUCTIONS   · Change dressings once per day or as directed by your caregiver.  · Gently clean the skin tear and the area around the tear using saline solution or mild soap and water.  · Do not rub the injured skin dry. Let the area air dry.  · Apply petroleum jelly or an antibiotic cream or ointment to keep the tear moist. This will help the wound heal. Do not allow a scab to form.  · If the dressing sticks before the next dressing change, moisten it with warm soapy water and gently remove it.  · Protect the injured skin until it has healed.  · Only take over-the-counter or prescription medicines as directed by your caregiver.  · Take showers or baths using warm soapy water. Apply a new dressing after the shower or bath.  · Keep all follow-up appointments as directed by your caregiver.    SEEK IMMEDIATE MEDICAL CARE IF:   · You have redness, swelling, or increasing pain in the skin tear.  · You have pus coming from the skin tear.  · You have chills.  · You have a red streak that goes away from the skin tear.  · You have a bad smell coming from the tear or dressing.  · You have a fever or persistent symptoms for more than 2 3 days.  · You  have a fever and your symptoms suddenly get worse.  MAKE SURE YOU:  · Understand these instructions.  · Will watch this condition.  · Will get help right away if your child is not doing well or gets worse.  Document Released: 12/07/2000 Document Revised: 12/07/2011 Document Reviewed: 09/26/2011  [employer]® Patient Information ©2014 [employer].

## 2013-05-09 ENCOUNTER — Encounter: Payer: Self-pay | Admitting: Internal Medicine

## 2013-05-09 ENCOUNTER — Ambulatory Visit (INDEPENDENT_AMBULATORY_CARE_PROVIDER_SITE_OTHER): Payer: MEDICARE | Admitting: Internal Medicine

## 2013-05-09 VITALS — BP 128/88 | HR 100 | Ht 68.0 in

## 2013-05-09 DIAGNOSIS — I495 Sick sinus syndrome: Secondary | ICD-10-CM

## 2013-05-09 DIAGNOSIS — I2510 Atherosclerotic heart disease of native coronary artery without angina pectoris: Secondary | ICD-10-CM

## 2013-05-09 DIAGNOSIS — I4891 Unspecified atrial fibrillation: Secondary | ICD-10-CM

## 2013-05-09 DIAGNOSIS — I482 Chronic atrial fibrillation, unspecified: Secondary | ICD-10-CM

## 2013-05-09 LAB — MDC_IDC_ENUM_SESS_TYPE_INCLINIC
Battery Impedance: 1700 Ohm
Battery Remaining Longevity: 2
Battery Voltage: 2.75 V
Brady Statistic RV Percent Paced: 11 %
Lead Channel Impedance Value: 350 Ohm
Lead Channel Impedance Value: 450 Ohm
Lead Channel Impedance Value: 550 Ohm
Lead Channel Pacing Threshold Amplitude: 0.625 V
Lead Channel Sensing Intrinsic Amplitude: 0.5 mV
Lead Channel Setting Pacing Amplitude: 2.5 V
Lead Channel Setting Pacing Pulse Width: 0.4 ms
MDC IDC MSMT LEADCHNL RV IMPEDANCE VALUE: 450 Ohm
MDC IDC MSMT LEADCHNL RV PACING THRESHOLD PULSEWIDTH: 0.4 ms
MDC IDC MSMT LEADCHNL RV SENSING INTR AMPL: 8 mV
MDC IDC PG SERIAL: 2706030895
MDC IDC SESS DTM: 20150212121420
MDC IDC SET LEADCHNL RV SENSING SENSITIVITY: 2 mV

## 2013-05-09 NOTE — Patient Instructions (Addendum)
Continue follow up with [facility] every 3 months. The next transmission will be due in May 2015.  Your physician recommends that you schedule a follow-up appointment in: 12 months with [name] [name]

## 2013-05-09 NOTE — Progress Notes (Signed)
PCP:  Florentina JennyRIPP, HENRY, MD Primary Cardiologist:  Dr Hardin NegusMcAlhany  [name] [name] is a 80 y.o. male with a h/o bradycardia sp PPM (Vitatron) by Dr Juanda ChanceBrodie 02/15/05  who presents today to for follow-up in the Electrophysiology device clinic.  He is quite frail. Today, he  denies symptoms of palpitations, exertional chest pain, shortness of breath,  presyncope, syncope, or neurologic sequela.  The patientis tolerating medications without difficulties and is otherwise without complaint today.   Past Medical History  Diagnosis Date  . Cardiac pacemaker in situ     s/p Viatron DDD Pacemaker for sinus node dysfunction  . Orthostatic hypotension   . Hyperlipidemia     mixed  . Atrial fibrillation     not felt to be a coumadin candidate due to falls  . Sinoatrial node dysfunction     s/p PPM (MDT)  . Coronary atherosclerosis of native coronary artery     s/p AWMI 2005 stent LAD  . Diabetes mellitus, type 2   . Chronic renal insufficiency, stage II (mild)   . Intolerance, drug     amniodarone  . Upper GI hemorrhage     2006 Mallory Weiss Tear  . MI (myocardial infarction)     coronary artery status post anterior wall myocardial infarction in 2005 treated with a stent to the left anterior descending.  . Dizziness     recurrent dizziness and falls  . Diabetes mellitus   . Hyperlipidemia   . Renal insufficiency     mild  . Dementia   . Depression   . CHF (congestive heart failure)   . GERD (gastroesophageal reflux disease)     Past Surgical History  Procedure Laterality Date  . Pacemaker placement    . Coronary stent placement      History   Social History  . Marital Status: Widowed    Spouse Name: N/A    Number of Children: N/A  . Years of Education: N/A   Occupational History  . retired     now living in [facility]   Social History Main Topics  . Smoking status: Former Smoker    Quit date: 03/28/1960  . Smokeless tobacco: Not on file  . Alcohol Use: No  .  Drug Use: No  . Sexual Activity: Not on file   Other Topics Concern  . Not on file   Social History Narrative   Retired   [place]  Accompanied by daughter today.    Family History  Problem Relation Age of Onset  . Coronary artery disease Brother   . Diabetes Mother     Allergies  Allergen Reactions  . Sulfa Antibiotics     Unknown  . Sulfonamide Derivatives     REACTION: hives  . Amiodarone Hcl Rash    Current Outpatient Prescriptions  Medication Sig Dispense Refill  . acetaminophen (TYLENOL) 500 MG tablet Take 1,000 mg by mouth every 6 (six) hours as needed. pain      . Artificial Tear Ointment (REFRESH LACRI-LUBE) OINT Apply 1 application to eye 3 (three) times daily as needed. 1/2 inch -  For discomfort      . atorvastatin (LIPITOR) 10 MG tablet Take 10 mg by mouth daily.      . beta carotene w/minerals (OCUVITE) tablet Take 1 tablet by mouth daily.      . brimonidine (ALPHAGAN) 0.15 % ophthalmic solution Place 1 drop into the right eye 2 (two) times daily.      .Marland Kitchen  clopidogrel (PLAVIX) 75 MG tablet Take 75 mg by mouth daily.        . clotrimazole-betamethasone (LOTRISONE) cream Apply 1 application topically daily. Apply to soles of feet      . dextrose (GLUTOSE) 40 % GEL Take 1 Tube by mouth once as needed. For glucose levels < 70      . digoxin (LANOXIN) 0.25 MG tablet Take 250 mcg by mouth daily. *HOLD IF HEART RATE IS LESS THAN 60*      . ferrous sulfate 325 (65 FE) MG tablet Take 325 mg by mouth daily with breakfast.        . fludrocortisone (FLORINEF) 0.1 MG tablet Take 0.1 mg by mouth 2 (two) times daily.       . furosemide (LASIX) 20 MG tablet Take 20 mg by mouth daily.      . metoprolol tartrate (LOPRESSOR) 25 MG tablet Take 25 mg by mouth 2 (two) times daily.      Marland Kitchen omeprazole (PRILOSEC) 20 MG capsule Take 20 mg by mouth daily.      Bertram Gala Glycol-Propyl Glycol (SYSTANE) 0.4-0.3 % SOLN Apply 1 drop to eye 4 (four) times  daily.       . potassium chloride SA (K-DUR,KLOR-CON) 20 MEQ tablet Take 20 mEq by mouth daily.        Marland Kitchen venlafaxine (EFFEXOR) 37.5 MG tablet Take 37.5 mg by mouth daily.       No current facility-administered medications for this visit.    Physical Exam: Filed Vitals:   05/09/13 1021  BP: 128/88  Pulse: 100  Height: 5\' 8"  (1.727 m)    GEN- The patient is elderly, alert today,  Head- normocephalic, atraumatic Eyes-  Sclera clear, conjunctiva pink Ears- hearing intact Oropharynx- clear Neck- supple, no JVP Lymph- no cervical lymphadenopathy Lungs- Clear to ausculation bilaterally, normal work of breathing Chest- pacemaker pocket is well healed Heart- irregular rate and rhythm,  GI- soft, NT, ND, + BS Extremities- no clubbing, cyanosis, trace edema MS- age appropriate muscle atrophy Psych- significant dementia.  He is unable to tell me where he lives  Pacemaker interrogation- reviewed in detail today,  See PACEART report  Assessment and Plan:  1. Symptomatic bradycardia Normal pacemaker function See Pace Art report No changes today  2. afib chads2vasc is >4 I would recommend anticoagulation with either eliquis or edoxaban rather than plavix. The patients daughter is clear that she does not wish for any changes at this time No changes today  3. CAD Stable No change required today  4. HTN Stable No change required today   [employer] every 3 months Return to see Nehemiah Settle in 1 year

## 2013-06-13 ENCOUNTER — Other Ambulatory Visit: Payer: Self-pay | Admitting: Dermatology

## 2013-06-25 ENCOUNTER — Emergency Department (HOSPITAL_COMMUNITY): Payer: MEDICARE

## 2013-06-25 ENCOUNTER — Emergency Department (HOSPITAL_COMMUNITY)
Admission: EM | Admit: 2013-06-25 | Discharge: 2013-06-25 | Disposition: A | Discharge status: Home / Self Care | Payer: MEDICARE | Attending: Emergency Medicine | Admitting: Emergency Medicine

## 2013-06-25 ENCOUNTER — Encounter (HOSPITAL_COMMUNITY): Payer: Self-pay | Admitting: Emergency Medicine

## 2013-06-25 DIAGNOSIS — Y838 Other surgical procedures as the cause of abnormal reaction of the patient, or of later complication, without mention of misadventure at the time of the procedure: Secondary | ICD-10-CM | POA: Insufficient documentation

## 2013-06-25 DIAGNOSIS — R233 Spontaneous ecchymoses: Secondary | ICD-10-CM | POA: Insufficient documentation

## 2013-06-25 DIAGNOSIS — Y9210 Unspecified residential institution as the place of occurrence of the external cause: Secondary | ICD-10-CM | POA: Insufficient documentation

## 2013-06-25 DIAGNOSIS — E119 Type 2 diabetes mellitus without complications: Secondary | ICD-10-CM | POA: Insufficient documentation

## 2013-06-25 DIAGNOSIS — IMO0002 Reserved for concepts with insufficient information to code with codable children: Secondary | ICD-10-CM | POA: Insufficient documentation

## 2013-06-25 DIAGNOSIS — Y9389 Activity, other specified: Secondary | ICD-10-CM | POA: Insufficient documentation

## 2013-06-25 DIAGNOSIS — F0390 Unspecified dementia without behavioral disturbance: Secondary | ICD-10-CM | POA: Insufficient documentation

## 2013-06-25 DIAGNOSIS — I2510 Atherosclerotic heart disease of native coronary artery without angina pectoris: Secondary | ICD-10-CM | POA: Insufficient documentation

## 2013-06-25 DIAGNOSIS — F3289 Other specified depressive episodes: Secondary | ICD-10-CM | POA: Insufficient documentation

## 2013-06-25 DIAGNOSIS — Z950 Presence of cardiac pacemaker: Secondary | ICD-10-CM | POA: Insufficient documentation

## 2013-06-25 DIAGNOSIS — F329 Major depressive disorder, single episode, unspecified: Secondary | ICD-10-CM | POA: Insufficient documentation

## 2013-06-25 DIAGNOSIS — K219 Gastro-esophageal reflux disease without esophagitis: Secondary | ICD-10-CM | POA: Insufficient documentation

## 2013-06-25 DIAGNOSIS — R296 Repeated falls: Secondary | ICD-10-CM | POA: Insufficient documentation

## 2013-06-25 DIAGNOSIS — Z79899 Other long term (current) drug therapy: Secondary | ICD-10-CM | POA: Insufficient documentation

## 2013-06-25 DIAGNOSIS — Z87891 Personal history of nicotine dependence: Secondary | ICD-10-CM | POA: Insufficient documentation

## 2013-06-25 DIAGNOSIS — T148XXA Other injury of unspecified body region, initial encounter: Secondary | ICD-10-CM

## 2013-06-25 DIAGNOSIS — Z9861 Coronary angioplasty status: Secondary | ICD-10-CM | POA: Insufficient documentation

## 2013-06-25 DIAGNOSIS — I509 Heart failure, unspecified: Secondary | ICD-10-CM | POA: Insufficient documentation

## 2013-06-25 DIAGNOSIS — I4891 Unspecified atrial fibrillation: Secondary | ICD-10-CM | POA: Insufficient documentation

## 2013-06-25 DIAGNOSIS — T8140XA Infection following a procedure, unspecified, initial encounter: Secondary | ICD-10-CM | POA: Insufficient documentation

## 2013-06-25 DIAGNOSIS — Z043 Encounter for examination and observation following other accident: Secondary | ICD-10-CM | POA: Insufficient documentation

## 2013-06-25 DIAGNOSIS — L089 Local infection of the skin and subcutaneous tissue, unspecified: Secondary | ICD-10-CM

## 2013-06-25 DIAGNOSIS — I252 Old myocardial infarction: Secondary | ICD-10-CM | POA: Insufficient documentation

## 2013-06-25 DIAGNOSIS — W19XXXA Unspecified fall, initial encounter: Secondary | ICD-10-CM

## 2013-06-25 DIAGNOSIS — N182 Chronic kidney disease, stage 2 (mild): Secondary | ICD-10-CM | POA: Insufficient documentation

## 2013-06-25 DIAGNOSIS — Z85828 Personal history of other malignant neoplasm of skin: Secondary | ICD-10-CM | POA: Insufficient documentation

## 2013-06-25 DIAGNOSIS — Z7902 Long term (current) use of antithrombotics/antiplatelets: Secondary | ICD-10-CM | POA: Insufficient documentation

## 2013-06-25 DIAGNOSIS — E785 Hyperlipidemia, unspecified: Secondary | ICD-10-CM | POA: Insufficient documentation

## 2013-06-25 DIAGNOSIS — E782 Mixed hyperlipidemia: Secondary | ICD-10-CM | POA: Insufficient documentation

## 2013-06-25 LAB — CBC WITH DIFFERENTIAL/PLATELET
BASOS PCT: 0 % (ref 0–1)
Basophils Absolute: 0 10*3/uL (ref 0.0–0.1)
EOS ABS: 0.2 10*3/uL (ref 0.0–0.7)
Eosinophils Relative: 3 % (ref 0–5)
HEMATOCRIT: 37.7 % — AB (ref 39.0–52.0)
HEMOGLOBIN: 12.5 g/dL — AB (ref 13.0–17.0)
LYMPHS ABS: 0.8 10*3/uL (ref 0.7–4.0)
Lymphocytes Relative: 15 % (ref 12–46)
MCH: 30.6 pg (ref 26.0–34.0)
MCHC: 33.2 g/dL (ref 30.0–36.0)
MCV: 92.4 fL (ref 78.0–100.0)
MONO ABS: 0.5 10*3/uL (ref 0.1–1.0)
MONOS PCT: 10 % (ref 3–12)
Neutro Abs: 4.1 10*3/uL (ref 1.7–7.7)
Neutrophils Relative %: 72 % (ref 43–77)
Platelets: 192 10*3/uL (ref 150–400)
RBC: 4.08 MIL/uL — AB (ref 4.22–5.81)
RDW: 14.5 % (ref 11.5–15.5)
WBC: 5.6 10*3/uL (ref 4.0–10.5)

## 2013-06-25 LAB — BASIC METABOLIC PANEL
BUN: 26 mg/dL — AB (ref 6–23)
CO2: 28 mEq/L (ref 19–32)
CREATININE: 1.2 mg/dL (ref 0.50–1.35)
Calcium: 8.7 mg/dL (ref 8.4–10.5)
Chloride: 103 mEq/L (ref 96–112)
GFR calc Af Amer: 60 mL/min — ABNORMAL LOW (ref >90)
GFR, EST NON AFRICAN AMERICAN: 52 mL/min — AB (ref >90)
Glucose, Bld: 109 mg/dL — ABNORMAL HIGH (ref 70–99)
Potassium: 4.6 mEq/L (ref 3.7–5.3)
Sodium: 142 mEq/L (ref 137–147)

## 2013-06-25 MED ORDER — DOXYCYCLINE HYCLATE 100 MG PO CAPS: 100.0000 mg | ORAL_CAPSULE | Freq: Two times a day (BID) | ORAL | Status: DC

## 2013-06-25 NOTE — ED Notes (Signed)
[facility] paged for transportation back to nursing facility

## 2013-06-25 NOTE — ED Notes (Signed)
Pt in from [facility] on [address] per report pt had unwitnessed fall today, unknown LOC, moves all extremities, takes blood thinners, hx of A fib, in A fib in route, alert per baseline, follows commands, no bleeding present upon arrival to ED, NAD

## 2013-06-25 NOTE — Discharge Instructions (Signed)
Change gauze over wound daily.  Remove Iodoform gauze from within the wound in 48 hours.  Rx:  Doxycycline for wound infection.  Wound Infection A wound infection happens when a type of germ (bacteria) starts growing in the wound. In some cases, this can cause the wound to break open. If cared for properly, the infected wound will heal from the inside to the outside. Wound infections need treatment. CAUSES An infection is caused by bacteria growing in the wound.  SYMPTOMS   Increase in redness, swelling, or pain at the wound site.  Increase in drainage at the wound site.  Wound or bandage (dressing) starts to smell bad.  Fever.  Feeling tired or fatigued.  Pus draining from the wound. TREATMENT  You caregiver will prescribe antibiotic medicine. The wound infection should improve within 24 to 48 hours. Any redness around the wound should stop spreading and the wound should be less painful.  HOME CARE INSTRUCTIONS   Only take over-the-counter or prescription medicines for pain, discomfort, or fever as directed by your caregiver.  Take your antibiotics as directed. Finish them even if you start to feel better.  Gently wash the area with mild soap and water 2 times a day, or as directed. Rinse off the soap. Pat the area dry with a clean towel. Do not rub the wound. This may cause bleeding.  Follow your caregiver's instructions for how often you need to change the dressing.  Apply ointment and a dressing to the wound as directed.  If the dressing sticks, moisten it with soapy water and gently remove it.  Change the bandage right away if it becomes wet, dirty, or develops a bad smell.  Take showers. Do not take tub baths, swim, or do anything that may soak the wound until it is healed.  Avoid exercises that make you sweat heavily.  Use anti-itch medicine as directed by your caregiver. The wound may itch when it is healing. Do not pick or scratch at the wound.  Follow up with  your caregiver to get your wound rechecked as directed. SEEK MEDICAL CARE IF:  You have an increase in swelling, pain, or redness around the wound.  You have an increase in the amount of pus coming from the wound.  There is a bad smell coming from the wound.  More of the wound breaks open.  You have a fever. MAKE SURE YOU:   Understand these instructions.  Will watch your condition.  Will get help right away if you are not doing well or get worse. Document Released: 12/11/2002 Document Revised: 06/06/2011 Document Reviewed: 07/18/2010 [facility] Patient Information 2014 [employer].

## 2013-06-25 NOTE — ED Notes (Signed)
Spoke with Apolinar JunesBrandon, med tech at facility, per report the pt is not under medication tx for abcess on chest, has orders to clean wound & apply Vaseline & cover with band aid until healed, Fayrene FearingJames, MD notified

## 2013-06-25 NOTE — ED Provider Notes (Signed)
CSN: [id number]     Arrival date & time 06/25/13  1037 History   First MD Initiated Contact with Patient 06/25/13 1055     Chief Complaint  Patient presents with  . Fall      HPI  Patient presents to the emergency room after a fall. Patient was at memory care at [facility]. Unknown fall. On the floor. Awake and conscious. Does have a history of A. fib. Isn't greatly only with Plavix. I was able to determine after the daughter's arrival that he had a resection of basal cell carcinoma from his chest 2 weeks ago. Per the facility's instructions it is been getting dressing changes and no noted incision and drainage. No recent antibiotic use. No formal diagnosis of infection.  Past Medical History  Diagnosis Date  . Cardiac pacemaker in situ     s/p Viatron DDD Pacemaker for sinus node dysfunction  . Orthostatic hypotension   . Hyperlipidemia     mixed  . Atrial fibrillation     not felt to be a coumadin candidate due to falls  . Sinoatrial node dysfunction     s/p PPM (MDT)  . Coronary atherosclerosis of native coronary artery     s/p AWMI 2005 stent LAD  . Diabetes mellitus, type 2   . Chronic renal insufficiency, stage II (mild)   . Intolerance, drug     amniodarone  . Upper GI hemorrhage     2006 Mallory Weiss Tear  . MI (myocardial infarction)     coronary artery status post anterior wall myocardial infarction in 2005 treated with a stent to the left anterior descending.  . Dizziness     recurrent dizziness and falls  . Diabetes mellitus   . Hyperlipidemia   . Renal insufficiency     mild  . Dementia   . Depression   . CHF (congestive heart failure)   . GERD (gastroesophageal reflux disease)    Past Surgical History  Procedure Laterality Date  . Pacemaker placement    . Coronary stent placement     Family History  Problem Relation Age of Onset  . Coronary artery disease Brother   . Diabetes Mother    History  Substance Use Topics  . Smoking status: Former Smoker     Quit date: 03/28/1960  . Smokeless tobacco: Not on file  . Alcohol Use: No    Review of Systems  Unable to perform ROS: Dementia   level V caveat: Dementia    Allergies  Sulfa antibiotics; Sulfonamide derivatives; and Amiodarone hcl  Home Medications   Current Outpatient Rx  Name  Route  Sig  Dispense  Refill  . acetaminophen (TYLENOL) 500 MG tablet   Oral   Take 1,000 mg by mouth every 6 (six) hours as needed. pain         . Artificial Tear Ointment (REFRESH P.M. OP)   Both Eyes   Place 0.5 application into both eyes 4 (four) times daily.         Marland Kitchen atorvastatin (LIPITOR) 10 MG tablet   Oral   Take 10 mg by mouth daily.         . brimonidine (ALPHAGAN) 0.2 % ophthalmic solution   Both Eyes   Place 1 drop into both eyes 2 (two) times daily. Wait 3-5 minutes between each drop         . clopidogrel (PLAVIX) 75 MG tablet   Oral   Take 75 mg by mouth daily.           Marland Kitchen  dextromethorphan (DELSYM) 30 MG/5ML liquid   Oral   Take 5 mLs by mouth daily as needed for cough.         . digoxin (LANOXIN) 0.25 MG tablet   Oral   Take 250 mcg by mouth daily. *HOLD IF HEART RATE IS LESS THAN 60*         . Emollient (EUCERIN) lotion   Topical   Apply 1 mL topically at bedtime. To feet         . ferrous sulfate 325 (65 FE) MG tablet   Oral   Take 325 mg by mouth daily with breakfast.           . fludrocortisone (FLORINEF) 0.1 MG tablet   Oral   Take 0.1 mg by mouth 2 (two) times daily.          . furosemide (LASIX) 20 MG tablet   Oral   Take 20 mg by mouth daily.         Marland Kitchen. latanoprost (XALATAN) 0.005 % ophthalmic solution   Both Eyes   Place 1 drop into both eyes at bedtime.         . metoprolol tartrate (LOPRESSOR) 25 MG tablet   Oral   Take 25 mg by mouth 2 (two) times daily.         . Multiple Vitamins-Minerals (I-VITE) TABS   Oral   Take 1 tablet by mouth daily.         . Multiple Vitamins-Minerals (RA THERAPEUTIC M PLUS BETA  CAR) TABS   Oral   Take 1 tablet by mouth daily.         Marland Kitchen. omeprazole (PRILOSEC) 20 MG capsule   Oral   Take 20 mg by mouth daily.         Bertram Gala. Polyethyl Glycol-Propyl Glycol (SYSTANE ULTRA) 0.4-0.3 % SOLN   Both Eyes   Place 1 drop into both eyes 4 (four) times daily.         . potassium chloride SA (K-DUR,KLOR-CON) 20 MEQ tablet   Oral   Take 20 mEq by mouth daily.           Marland Kitchen. tobramycin-dexamethasone (TOBRADEX) ophthalmic ointment   Left Eye   Place 0.5 application into the left eye 4 (four) times daily.         Marland Kitchen. venlafaxine (EFFEXOR) 37.5 MG tablet   Oral   Take 37.5 mg by mouth daily.         Marland Kitchen. doxycycline (VIBRAMYCIN) 100 MG capsule   Oral   Take 1 capsule (100 mg total) by mouth 2 (two) times daily.   20 capsule   0    BP 135/74  Pulse 80  Temp(Src) 97.4 F (36.3 C) (Oral)  Resp 18  SpO2 100% Physical Exam  Constitutional: He appears well-developed and well-nourished. No distress.  HENT:  Head: Normocephalic.  No signs of trauma or strike her head. Nontender to palpate over the occiput and spine.  Eyes: Conjunctivae are normal. Pupils are equal, round, and reactive to light. No scleral icterus.  Neck: Normal range of motion. Neck supple. No thyromegaly present.  Cardiovascular: Normal rate and regular rhythm.  Exam reveals no gallop and no friction rub.   No murmur heard. Pulmonary/Chest: Effort normal and breath sounds normal. No respiratory distress. He has no wheezes. He has no rales.    Abdominal: Soft. Bowel sounds are normal. He exhibits no distension. There is no tenderness. There is no rebound.  Musculoskeletal: Normal range  of motion.  Nontender to range of motion of the extremities x4. Nontender over the ribs and spine and pelvis.  Neurological: He is alert.  Skin: Skin is warm and dry. No rash noted.  Multiple areas of subcutaneous bruising on his arms consistent with his Plavix use. No stereotypical, or concerning-appearing markings  to his skin.    ED Course  Procedures (including critical care time) Labs Review Labs Reviewed  CBC WITH DIFFERENTIAL - Abnormal; Notable for the following:    RBC 4.08 (*)    Hemoglobin 12.5 (*)    HCT 37.7 (*)    All other components within normal limits  BASIC METABOLIC PANEL - Abnormal; Notable for the following:    Glucose, Bld 109 (*)    BUN 26 (*)    GFR calc non Af Amer 52 (*)    GFR calc Af Amer 60 (*)    All other components within normal limits   Imaging Review Ct Head Wo Contrast  06/25/2013   CLINICAL DATA:  Unwitnessed fall. On blood thinner. Atrial fibrillation. Headache.  EXAM: CT HEAD WITHOUT CONTRAST  TECHNIQUE: Contiguous axial images were obtained from the base of the skull through the vertex without intravenous contrast.  COMPARISON:  09/11/2012.  FINDINGS: No skull fracture or intracranial hemorrhage.  Remote infarct left coronal radiata. Prominent small vessel disease type changes. No CT evidence of large acute infarct.  Global atrophy without hydrocephalus.  No intracranial mass lesion noted on this unenhanced exam.  Vascular calcifications.  IMPRESSION: No skull fracture or intracranial hemorrhage.  Please see above.   Electronically Signed   By: Bridgett Larsson M.D.   On: 06/25/2013 12:02     EKG Interpretation   Date/Time:  Tuesday June 25 2013 10:42:18 EDT Ventricular Rate:  75 PR Interval:    QRS Duration: 91 QT Interval:  400 QTC Calculation: 447 R Axis:   -48 Text Interpretation:  Atrial fibrillation Left anterior fascicular block  Confirmed by Fayrene Fearing  MD, Michaeleen Down ([id number]) on 06/25/2013 10:55:35 AM      MDM   Final diagnoses:  Wound infection  Fall    No apparent injuries related to his fall. The postsurgical mode of the chest appears infected with purulent discharge. Is inappropriate. There is only superficial depth. He was irrigated until clear effluent. Small wick of iodoform gauze placed into both areas of skin breakdown. And covered with 2 x 2  and adhesive dressing.  Alesse for nausea Levsin for 48 hours and continue wet-to-dry dressings. Primary care followup. Surgeon followup.    Rolland Porter, MD 06/25/13 1318

## 2013-06-25 NOTE — ED Notes (Signed)
Pt ambulating in hall, pt escorted back to room, reoriented & encouraged to stay in bed & call for assistance, pt calm & cooperative

## 2013-08-13 ENCOUNTER — Other Ambulatory Visit: Payer: Self-pay | Admitting: Otolaryngology

## 2013-08-13 DIAGNOSIS — R1310 Dysphagia, unspecified: Secondary | ICD-10-CM

## 2013-08-20 ENCOUNTER — Ambulatory Visit
Admission: RE | Admit: 2013-08-20 | Discharge: 2013-08-20 | Disposition: A | Discharge status: Home / Self Care | Payer: MEDICARE | Source: Ambulatory Visit | Attending: Otolaryngology | Admitting: Otolaryngology

## 2013-08-20 DIAGNOSIS — R1310 Dysphagia, unspecified: Secondary | ICD-10-CM

## 2013-09-24 ENCOUNTER — Other Ambulatory Visit: Payer: Self-pay | Admitting: Dermatology

## 2013-12-24 ENCOUNTER — Ambulatory Visit (INDEPENDENT_AMBULATORY_CARE_PROVIDER_SITE_OTHER): Payer: MEDICARE | Admitting: Cardiovascular Disease

## 2013-12-24 ENCOUNTER — Encounter: Payer: Self-pay | Admitting: Cardiovascular Disease

## 2013-12-24 VITALS — BP 130/80 | HR 70 | Ht 68.0 in | Wt 128.0 lb

## 2013-12-24 DIAGNOSIS — I255 Ischemic cardiomyopathy: Secondary | ICD-10-CM

## 2013-12-24 DIAGNOSIS — I482 Chronic atrial fibrillation, unspecified: Secondary | ICD-10-CM

## 2013-12-24 DIAGNOSIS — I2589 Other forms of chronic ischemic heart disease: Secondary | ICD-10-CM

## 2013-12-24 DIAGNOSIS — I4891 Unspecified atrial fibrillation: Secondary | ICD-10-CM

## 2013-12-24 DIAGNOSIS — I2510 Atherosclerotic heart disease of native coronary artery without angina pectoris: Secondary | ICD-10-CM

## 2013-12-24 NOTE — Progress Notes (Signed)
History of Present Illness: 80 yo WM with history of CAD, systolic CHF, atrial fibrillation here for cardiac follow up. He has been followed in the past by Dr. Juanda Chance. He had an anterior MI 2005 treated with a stent to the LAD. He has chronic atrial fibrillation which is being managed with rate control with metoprolol. He had been on coumadin but this was stopped secondary to his fall and bleeding risk. He has a pacemaker. He also has a history of systolic CHF with an ejection fraction of 45-50%. In July 2011, he suffered a stroke and was admitted to the hospital. He has severe dementia and resides in an assisted living memory unit facility [facility] His main limitation continues to be related to memory problems.   He is here today for follow up. No SOB, dizziness, near syncope or syncope. No chest pains. Overall doing well. His daughter is with him today and reports that he is doing well.   Primary Care Physician: Dr. Redmond School  Past Medical History  Diagnosis Date  . Cardiac pacemaker in situ     s/p Viatron DDD Pacemaker for sinus node dysfunction  . Orthostatic hypotension   . Hyperlipidemia     mixed  . Atrial fibrillation     not felt to be a coumadin candidate due to falls  . Sinoatrial node dysfunction     s/p PPM (MDT)  . Coronary atherosclerosis of native coronary artery     s/p AWMI 2005 stent LAD  . Diabetes mellitus, type 2   . Chronic renal insufficiency, stage II (mild)   . Intolerance, drug     amniodarone  . Upper GI hemorrhage     2006 Mallory Weiss Tear  . MI (myocardial infarction)     coronary artery status post anterior wall myocardial infarction in 2005 treated with a stent to the left anterior descending.  . Dizziness     recurrent dizziness and falls  . Diabetes mellitus   . Hyperlipidemia   . Renal insufficiency     mild  . Dementia   . Depression   . CHF (congestive heart failure)   . GERD (gastroesophageal reflux disease)     Past Surgical  History  Procedure Laterality Date  . Pacemaker placement    . Coronary stent placement      Current Outpatient Prescriptions  Medication Sig Dispense Refill  . acetaminophen (TYLENOL) 500 MG tablet Take 1,000 mg by mouth every 6 (six) hours as needed. pain      . Artificial Tear Ointment (REFRESH P.M. OP) Place 0.5 application into both eyes 4 (four) times daily.      Marland Kitchen atorvastatin (LIPITOR) 10 MG tablet Take 10 mg by mouth daily.      . brimonidine (ALPHAGAN) 0.2 % ophthalmic solution Place 1 drop into both eyes 2 (two) times daily. Wait 3-5 minutes between each drop      . clopidogrel (PLAVIX) 75 MG tablet Take 75 mg by mouth daily.        Marland Kitchen dextromethorphan (DELSYM) 30 MG/5ML liquid Take 5 mLs by mouth daily as needed for cough.      . digoxin (LANOXIN) 0.25 MG tablet Take 250 mcg by mouth daily. *HOLD IF HEART RATE IS LESS THAN 60*      . doxycycline (VIBRAMYCIN) 100 MG capsule Take 1 capsule (100 mg total) by mouth 2 (two) times daily.  20 capsule  0  . Emollient (EUCERIN) lotion Apply 1 mL topically at bedtime. To  feet      . ferrous sulfate 325 (65 FE) MG tablet Take 325 mg by mouth daily with breakfast.        . fludrocortisone (FLORINEF) 0.1 MG tablet Take 0.1 mg by mouth 2 (two) times daily.       . furosemide (LASIX) 20 MG tablet Take 20 mg by mouth daily.      Marland Kitchen. latanoprost (XALATAN) 0.005 % ophthalmic solution Place 1 drop into both eyes at bedtime.      . metoprolol tartrate (LOPRESSOR) 25 MG tablet Take 25 mg by mouth 2 (two) times daily.      . Multiple Vitamins-Minerals (I-VITE) TABS Take 1 tablet by mouth daily.      . Multiple Vitamins-Minerals (RA THERAPEUTIC M PLUS BETA CAR) TABS Take 1 tablet by mouth daily.      Marland Kitchen. omeprazole (PRILOSEC) 20 MG capsule Take 20 mg by mouth daily.      Bertram Gala. Polyethyl Glycol-Propyl Glycol (SYSTANE ULTRA) 0.4-0.3 % SOLN Place 1 drop into both eyes 4 (four) times daily.      . potassium chloride SA (K-DUR,KLOR-CON) 20 MEQ tablet Take 20 mEq by  mouth daily.        Marland Kitchen. tobramycin-dexamethasone (TOBRADEX) ophthalmic ointment Place 0.5 application into the left eye 4 (four) times daily.      Marland Kitchen. venlafaxine (EFFEXOR) 37.5 MG tablet Take 37.5 mg by mouth daily.       No current facility-administered medications for this visit.    Allergies  Allergen Reactions  . Sulfa Antibiotics     Unknown  . Sulfonamide Derivatives     REACTION: hives  . Amiodarone Hcl Rash    History   Social History  . Marital Status: Widowed    Spouse Name: N/A    Number of Children: N/A  . Years of Education: N/A   Occupational History  . retired     now living in [facility]   Social History Main Topics  . Smoking status: Former Smoker    Quit date: 03/28/1960  . Smokeless tobacco: Not on file  . Alcohol Use: No  . Drug Use: No  . Sexual Activity: Not on file   Other Topics Concern  . Not on file   Social History Narrative   Retired   [place]  Accompanied by daughter today.    Family History  Problem Relation Age of Onset  . Coronary artery disease Brother   . Diabetes Mother     Review of Systems:  As stated in the HPI and otherwise negative.   BP 130/80  Pulse 70  Ht 5\' 8"  (1.727 m)  Wt 128 lb (58.06 kg)  BMI 19.47 kg/m2  Physical Examination: General: Thin, cachectic, NAD HEENT: OP clear, mucus membranes moist SKIN: warm, dry. No rashes. Neuro: No focal deficits Musculoskeletal: Muscle strength 5/5 all ext Psychiatric: Flat affect Neck: No JVD, no carotid bruits, no thyromegaly, no lymphadenopathy. Lungs:Clear bilaterally, no wheezes, rhonci, crackles Cardiovascular: Irregular. Systolic murmu. No gallops or rubs. Abdomen:Soft. Bowel sounds present. Non-tender.  Extremities: No lower extremity edema. Pulses are 2 + in the bilateral DP/PT.  Assessment and Plan:   1. CAD: Stable. No changes in therapy.     2. Cardiomyopathy, ischemic: Volume status is ok. Continue  medical therapy.    3. PACEMAKER, PERMANENT: Follow up with DR. Allred.   4. ATRIAL FIBRILLATION: Rate seems to be controlled on exam. Tolerating beta blocker and digoxin.

## 2013-12-24 NOTE — Patient Instructions (Signed)
Your physician wants you to follow-up in:  12 months.  You will receive a reminder letter in the mail two months in advance. If you don't receive a letter, please call our office to schedule the follow-up appointment.   

## 2013-12-28 ENCOUNTER — Encounter (HOSPITAL_COMMUNITY): Payer: Self-pay | Admitting: Emergency Medicine

## 2013-12-28 ENCOUNTER — Emergency Department (HOSPITAL_COMMUNITY): Payer: MEDICARE

## 2013-12-28 ENCOUNTER — Inpatient Hospital Stay (HOSPITAL_COMMUNITY)
Admission: EM | Admit: 2013-12-28 | Discharge: 2013-12-30 | DRG: 871 | Disposition: A | Discharge status: Skilled Nursing Facility | Payer: MEDICARE | Attending: Internal Medicine | Admitting: Internal Medicine

## 2013-12-28 DIAGNOSIS — Z955 Presence of coronary angioplasty implant and graft: Secondary | ICD-10-CM | POA: Diagnosis not present

## 2013-12-28 DIAGNOSIS — Z950 Presence of cardiac pacemaker: Secondary | ICD-10-CM | POA: Diagnosis not present

## 2013-12-28 DIAGNOSIS — Z7902 Long term (current) use of antithrombotics/antiplatelets: Secondary | ICD-10-CM | POA: Diagnosis not present

## 2013-12-28 DIAGNOSIS — Z882 Allergy status to sulfonamides status: Secondary | ICD-10-CM | POA: Diagnosis not present

## 2013-12-28 DIAGNOSIS — R627 Adult failure to thrive: Secondary | ICD-10-CM | POA: Diagnosis present

## 2013-12-28 DIAGNOSIS — I5022 Chronic systolic (congestive) heart failure: Secondary | ICD-10-CM | POA: Diagnosis present

## 2013-12-28 DIAGNOSIS — T68XXXA Hypothermia, initial encounter: Secondary | ICD-10-CM | POA: Diagnosis present

## 2013-12-28 DIAGNOSIS — E119 Type 2 diabetes mellitus without complications: Secondary | ICD-10-CM | POA: Diagnosis present

## 2013-12-28 DIAGNOSIS — W19XXXA Unspecified fall, initial encounter: Secondary | ICD-10-CM | POA: Diagnosis present

## 2013-12-28 DIAGNOSIS — F0390 Unspecified dementia without behavioral disturbance: Secondary | ICD-10-CM | POA: Diagnosis present

## 2013-12-28 DIAGNOSIS — Y92122 Bedroom in nursing home as the place of occurrence of the external cause: Secondary | ICD-10-CM | POA: Diagnosis not present

## 2013-12-28 DIAGNOSIS — I951 Orthostatic hypotension: Secondary | ICD-10-CM | POA: Diagnosis present

## 2013-12-28 DIAGNOSIS — N183 Chronic kidney disease, stage 3 unspecified: Secondary | ICD-10-CM

## 2013-12-28 DIAGNOSIS — Z9181 History of falling: Secondary | ICD-10-CM

## 2013-12-28 DIAGNOSIS — N390 Urinary tract infection, site not specified: Secondary | ICD-10-CM | POA: Diagnosis not present

## 2013-12-28 DIAGNOSIS — B964 Proteus (mirabilis) (morganii) as the cause of diseases classified elsewhere: Secondary | ICD-10-CM | POA: Diagnosis present

## 2013-12-28 DIAGNOSIS — G9340 Encephalopathy, unspecified: Secondary | ICD-10-CM | POA: Diagnosis present

## 2013-12-28 DIAGNOSIS — R296 Repeated falls: Secondary | ICD-10-CM

## 2013-12-28 DIAGNOSIS — R42 Dizziness and giddiness: Secondary | ICD-10-CM | POA: Diagnosis present

## 2013-12-28 DIAGNOSIS — Z8249 Family history of ischemic heart disease and other diseases of the circulatory system: Secondary | ICD-10-CM

## 2013-12-28 DIAGNOSIS — Z66 Do not resuscitate: Secondary | ICD-10-CM | POA: Diagnosis present

## 2013-12-28 DIAGNOSIS — I495 Sick sinus syndrome: Secondary | ICD-10-CM | POA: Diagnosis present

## 2013-12-28 DIAGNOSIS — E782 Mixed hyperlipidemia: Secondary | ICD-10-CM | POA: Diagnosis present

## 2013-12-28 DIAGNOSIS — Z833 Family history of diabetes mellitus: Secondary | ICD-10-CM

## 2013-12-28 DIAGNOSIS — K219 Gastro-esophageal reflux disease without esophagitis: Secondary | ICD-10-CM | POA: Diagnosis present

## 2013-12-28 DIAGNOSIS — Z888 Allergy status to other drugs, medicaments and biological substances status: Secondary | ICD-10-CM | POA: Diagnosis not present

## 2013-12-28 DIAGNOSIS — Z87891 Personal history of nicotine dependence: Secondary | ICD-10-CM

## 2013-12-28 DIAGNOSIS — A4189 Other specified sepsis: Secondary | ICD-10-CM | POA: Diagnosis present

## 2013-12-28 DIAGNOSIS — A419 Sepsis, unspecified organism: Secondary | ICD-10-CM

## 2013-12-28 DIAGNOSIS — I482 Chronic atrial fibrillation, unspecified: Secondary | ICD-10-CM

## 2013-12-28 DIAGNOSIS — I2510 Atherosclerotic heart disease of native coronary artery without angina pectoris: Secondary | ICD-10-CM | POA: Diagnosis present

## 2013-12-28 DIAGNOSIS — S7002XA Contusion of left hip, initial encounter: Secondary | ICD-10-CM | POA: Diagnosis present

## 2013-12-28 DIAGNOSIS — S51011A Laceration without foreign body of right elbow, initial encounter: Secondary | ICD-10-CM | POA: Diagnosis present

## 2013-12-28 DIAGNOSIS — F329 Major depressive disorder, single episode, unspecified: Secondary | ICD-10-CM | POA: Diagnosis present

## 2013-12-28 DIAGNOSIS — I252 Old myocardial infarction: Secondary | ICD-10-CM

## 2013-12-28 DIAGNOSIS — Z79899 Other long term (current) drug therapy: Secondary | ICD-10-CM

## 2013-12-28 DIAGNOSIS — I4891 Unspecified atrial fibrillation: Secondary | ICD-10-CM | POA: Diagnosis present

## 2013-12-28 LAB — COMPREHENSIVE METABOLIC PANEL
ALK PHOS: 110 U/L (ref 39–117)
ALT: 28 U/L (ref 0–53)
ANION GAP: 13 (ref 5–15)
AST: 35 U/L (ref 0–37)
Albumin: 3.2 g/dL — ABNORMAL LOW (ref 3.5–5.2)
BILIRUBIN TOTAL: 1.1 mg/dL (ref 0.3–1.2)
BUN: 28 mg/dL — AB (ref 6–23)
CO2: 26 mEq/L (ref 19–32)
Calcium: 8.8 mg/dL (ref 8.4–10.5)
Chloride: 103 mEq/L (ref 96–112)
Creatinine, Ser: 1.26 mg/dL (ref 0.50–1.35)
GFR calc non Af Amer: 49 mL/min — ABNORMAL LOW (ref >90)
GFR, EST AFRICAN AMERICAN: 57 mL/min — AB (ref >90)
GLUCOSE: 129 mg/dL — AB (ref 70–99)
POTASSIUM: 4 meq/L (ref 3.7–5.3)
Sodium: 142 mEq/L (ref 137–147)
TOTAL PROTEIN: 7.2 g/dL (ref 6.0–8.3)

## 2013-12-28 LAB — CBC
HCT: 37.2 % — ABNORMAL LOW (ref 39.0–52.0)
Hemoglobin: 12.3 g/dL — ABNORMAL LOW (ref 13.0–17.0)
MCH: 29.1 pg (ref 26.0–34.0)
MCHC: 33.1 g/dL (ref 30.0–36.0)
MCV: 88.2 fL (ref 78.0–100.0)
PLATELETS: 192 10*3/uL (ref 150–400)
RBC: 4.22 MIL/uL (ref 4.22–5.81)
RDW: 14.7 % (ref 11.5–15.5)
WBC: 6.6 10*3/uL (ref 4.0–10.5)

## 2013-12-28 LAB — CBG MONITORING, ED: Glucose-Capillary: 118 mg/dL — ABNORMAL HIGH (ref 70–99)

## 2013-12-28 LAB — GLUCOSE, CAPILLARY
Glucose-Capillary: 85 mg/dL (ref 70–99)
Glucose-Capillary: 142 mg/dL — ABNORMAL HIGH (ref 70–99)
Glucose-Capillary: 160 mg/dL — ABNORMAL HIGH (ref 70–99)

## 2013-12-28 LAB — LACTIC ACID, PLASMA: Lactic Acid, Venous: 1.2 mmol/L (ref 0.5–2.2)

## 2013-12-28 LAB — URINALYSIS, ROUTINE W REFLEX MICROSCOPIC
Bilirubin Urine: NEGATIVE
Glucose, UA: NEGATIVE mg/dL
Ketones, ur: NEGATIVE mg/dL
Nitrite: NEGATIVE
PROTEIN: 30 mg/dL — AB
Specific Gravity, Urine: 1.012 (ref 1.005–1.030)
Urobilinogen, UA: 0.2 mg/dL (ref 0.0–1.0)
pH: 8.5 — ABNORMAL HIGH (ref 5.0–8.0)

## 2013-12-28 LAB — HEMOGLOBIN A1C
Hgb A1c MFr Bld: 7 % — ABNORMAL HIGH (ref <5.7)
MEAN PLASMA GLUCOSE: 154 mg/dL — AB (ref <117)

## 2013-12-28 LAB — MRSA PCR SCREENING: MRSA by PCR: NEGATIVE

## 2013-12-28 LAB — TSH: TSH: 0.914 u[IU]/mL (ref 0.350–4.500)

## 2013-12-28 LAB — PRO B NATRIURETIC PEPTIDE: Pro B Natriuretic peptide (BNP): 14620 pg/mL — ABNORMAL HIGH (ref 0–450)

## 2013-12-28 LAB — URINE MICROSCOPIC-ADD ON

## 2013-12-28 LAB — CK: Total CK: 156 U/L (ref 7–232)

## 2013-12-28 LAB — TROPONIN I: Troponin I: <0.3 ng/mL (ref <0.30)

## 2013-12-28 LAB — DIGOXIN LEVEL: DIGOXIN LVL: 0.9 ng/mL (ref 0.8–2.0)

## 2013-12-28 MED ORDER — TOBRAMYCIN-DEXAMETHASONE 0.3-0.1 % OP OINT
1.0000 "application " | TOPICAL_OINTMENT | Freq: Four times a day (QID) | OPHTHALMIC | Status: DC
  Administered 2013-12-28 – 2013-12-30 (×8): 1 via OPHTHALMIC
  Filled 2013-12-28: qty 3.5

## 2013-12-28 MED ORDER — FUROSEMIDE 10 MG/ML IJ SOLN
40.0000 mg | Freq: Once | INTRAMUSCULAR | Status: AC
  Administered 2013-12-28: 40 mg via INTRAVENOUS
  Filled 2013-12-28: qty 4

## 2013-12-28 MED ORDER — POLYVINYL ALCOHOL 1.4 % OP SOLN
1.0000 [drp] | Freq: Four times a day (QID) | OPHTHALMIC | Status: DC
  Administered 2013-12-28 – 2013-12-30 (×8): 1 [drp] via OPHTHALMIC
  Filled 2013-12-28: qty 15

## 2013-12-28 MED ORDER — HEPARIN SODIUM (PORCINE) 5000 UNIT/ML IJ SOLN
5000.0000 [IU] | Freq: Three times a day (TID) | INTRAMUSCULAR | Status: DC
  Administered 2013-12-28 – 2013-12-30 (×6): 5000 [IU] via SUBCUTANEOUS
  Filled 2013-12-28 (×9): qty 1

## 2013-12-28 MED ORDER — MORPHINE SULFATE 2 MG/ML IJ SOLN: 1.0000 mg | INTRAMUSCULAR | Status: DC | PRN

## 2013-12-28 MED ORDER — SODIUM CHLORIDE 0.9 % IV SOLN: 250.0000 mL | INTRAVENOUS | Status: DC | PRN

## 2013-12-28 MED ORDER — ONDANSETRON HCL 4 MG PO TABS: 4.0000 mg | ORAL_TABLET | Freq: Four times a day (QID) | ORAL | Status: DC | PRN

## 2013-12-28 MED ORDER — CEFTRIAXONE SODIUM 1 G IJ SOLR
1.0000 g | Freq: Once | INTRAMUSCULAR | Status: AC
  Administered 2013-12-28: 1 g via INTRAVENOUS
  Filled 2013-12-28: qty 10

## 2013-12-28 MED ORDER — HYDROCORTISONE 1 % EX CREA
TOPICAL_CREAM | Freq: Two times a day (BID) | CUTANEOUS | Status: DC
  Administered 2013-12-28 – 2013-12-29 (×4): via TOPICAL
  Administered 2013-12-30: 1 via TOPICAL
  Filled 2013-12-28: qty 28

## 2013-12-28 MED ORDER — VENLAFAXINE HCL 37.5 MG PO TABS
37.5000 mg | ORAL_TABLET | Freq: Every day | ORAL | Status: DC
  Administered 2013-12-28 – 2013-12-30 (×3): 37.5 mg via ORAL
  Filled 2013-12-28 (×3): qty 1

## 2013-12-28 MED ORDER — SODIUM CHLORIDE 0.9 % IJ SOLN: 3.0000 mL | Freq: Two times a day (BID) | INTRAMUSCULAR | Status: DC

## 2013-12-28 MED ORDER — ACETAMINOPHEN 650 MG RE SUPP: 650.0000 mg | Freq: Four times a day (QID) | RECTAL | Status: DC | PRN

## 2013-12-28 MED ORDER — SODIUM CHLORIDE 0.9 % IJ SOLN
3.0000 mL | Freq: Two times a day (BID) | INTRAMUSCULAR | Status: DC
  Administered 2013-12-28 – 2013-12-30 (×5): 3 mL via INTRAVENOUS

## 2013-12-28 MED ORDER — DIGOXIN 250 MCG PO TABS
250.0000 ug | ORAL_TABLET | Freq: Every day | ORAL | Status: DC
  Administered 2013-12-28 – 2013-12-30 (×3): 250 ug via ORAL
  Filled 2013-12-28 (×3): qty 1

## 2013-12-28 MED ORDER — BRIMONIDINE TARTRATE 0.2 % OP SOLN
1.0000 [drp] | Freq: Two times a day (BID) | OPHTHALMIC | Status: DC
  Administered 2013-12-28 – 2013-12-30 (×5): 1 [drp] via OPHTHALMIC
  Filled 2013-12-28: qty 5

## 2013-12-28 MED ORDER — FUROSEMIDE 20 MG PO TABS
20.0000 mg | ORAL_TABLET | Freq: Every day | ORAL | Status: DC
  Administered 2013-12-28 – 2013-12-30 (×3): 20 mg via ORAL
  Filled 2013-12-28 (×3): qty 1

## 2013-12-28 MED ORDER — INSULIN ASPART 100 UNIT/ML ~~LOC~~ SOLN
0.0000 [IU] | Freq: Three times a day (TID) | SUBCUTANEOUS | Status: DC
  Administered 2013-12-28 – 2013-12-29 (×2): 2 [IU] via SUBCUTANEOUS
  Administered 2013-12-30: 3 [IU] via SUBCUTANEOUS

## 2013-12-28 MED ORDER — OXYCODONE HCL 5 MG PO TABS
5.0000 mg | ORAL_TABLET | ORAL | Status: DC | PRN
  Administered 2013-12-28 – 2013-12-30 (×3): 5 mg via ORAL
  Filled 2013-12-28 (×3): qty 1

## 2013-12-28 MED ORDER — INSULIN ASPART 100 UNIT/ML ~~LOC~~ SOLN: 0.0000 [IU] | Freq: Every day | SUBCUTANEOUS | Status: DC

## 2013-12-28 MED ORDER — METOPROLOL TARTRATE 25 MG PO TABS
25.0000 mg | ORAL_TABLET | Freq: Two times a day (BID) | ORAL | Status: DC
  Administered 2013-12-28 – 2013-12-30 (×5): 25 mg via ORAL
  Filled 2013-12-28 (×6): qty 1

## 2013-12-28 MED ORDER — SODIUM CHLORIDE 0.9 % IJ SOLN: 3.0000 mL | INTRAMUSCULAR | Status: DC | PRN

## 2013-12-28 MED ORDER — DEXTROSE 5 % IV SOLN
1.0000 g | INTRAVENOUS | Status: DC
  Administered 2013-12-29 – 2013-12-30 (×2): 1 g via INTRAVENOUS
  Filled 2013-12-28 (×2): qty 10

## 2013-12-28 MED ORDER — ALUM & MAG HYDROXIDE-SIMETH 200-200-20 MG/5ML PO SUSP: 30.0000 mL | Freq: Four times a day (QID) | ORAL | Status: DC | PRN

## 2013-12-28 MED ORDER — POTASSIUM CHLORIDE CRYS ER 20 MEQ PO TBCR
20.0000 meq | EXTENDED_RELEASE_TABLET | Freq: Every day | ORAL | Status: DC
  Administered 2013-12-28 – 2013-12-30 (×3): 20 meq via ORAL
  Filled 2013-12-28 (×3): qty 1

## 2013-12-28 MED ORDER — FLUDROCORTISONE ACETATE 0.1 MG PO TABS
0.1000 mg | ORAL_TABLET | Freq: Two times a day (BID) | ORAL | Status: DC
  Administered 2013-12-28 – 2013-12-30 (×5): 0.1 mg via ORAL
  Filled 2013-12-28 (×7): qty 1

## 2013-12-28 MED ORDER — ACETAMINOPHEN 325 MG PO TABS: 650.0000 mg | ORAL_TABLET | Freq: Four times a day (QID) | ORAL | Status: DC | PRN

## 2013-12-28 MED ORDER — LATANOPROST 0.005 % OP SOLN
1.0000 [drp] | Freq: Every day | OPHTHALMIC | Status: DC
  Administered 2013-12-28 – 2013-12-29 (×2): 1 [drp] via OPHTHALMIC
  Filled 2013-12-28: qty 2.5

## 2013-12-28 MED ORDER — ONDANSETRON HCL 4 MG/2ML IJ SOLN: 4.0000 mg | Freq: Four times a day (QID) | INTRAMUSCULAR | Status: DC | PRN

## 2013-12-28 MED ORDER — ATORVASTATIN CALCIUM 10 MG PO TABS
10.0000 mg | ORAL_TABLET | Freq: Every day | ORAL | Status: DC
  Administered 2013-12-28 – 2013-12-29 (×2): 10 mg via ORAL
  Filled 2013-12-28 (×3): qty 1

## 2013-12-28 MED ORDER — CLOPIDOGREL BISULFATE 75 MG PO TABS
75.0000 mg | ORAL_TABLET | Freq: Every day | ORAL | Status: DC
  Administered 2013-12-28 – 2013-12-30 (×3): 75 mg via ORAL
  Filled 2013-12-28 (×5): qty 1

## 2013-12-28 NOTE — ED Provider Notes (Signed)
CSN: [id number]     Arrival date & time 12/28/13  0544 History   First MD Initiated Contact with Patient 12/28/13 [phone]     Chief Complaint  Patient presents with  . Fall     (Consider location/radiation/quality/duration/timing/severity/associated sxs/prior Treatment) HPI  [name] [name] is a(n) 80 y.o. male who presents to the ED with CC of Fall. Level 5 caveat due to dementia. Hea has a pmh of DM, A-fib, CAD, ischemic dcardiomyopathy, demetnia, renal insufficiency. He is s/p pacer placement. BIB ems for unwitnessed [facility]. He is on plavix. C/o urinary urgency and found to be retaining urine on bladder scan. Arrives with rectal temp of 95.1. Hematoma on L hip but he was reported able to ambulate by EMS. Skin tear to R elbow, no other c/o pain. Found 02 sats waffling b/n 90/98% on RA.  Past Medical History  Diagnosis Date  . Cardiac pacemaker in situ     s/p Viatron DDD Pacemaker for sinus node dysfunction  . Orthostatic hypotension   . Hyperlipidemia     mixed  . Atrial fibrillation     not felt to be a coumadin candidate due to falls  . Sinoatrial node dysfunction     s/p PPM (MDT)  . Coronary atherosclerosis of native coronary artery     s/p AWMI 2005 stent LAD  . Diabetes mellitus, type 2   . Chronic renal insufficiency, stage II (mild)   . Intolerance, drug     amniodarone  . Upper GI hemorrhage     2006 Mallory Weiss Tear  . MI (myocardial infarction)     coronary artery status post anterior wall myocardial infarction in 2005 treated with a stent to the left anterior descending.  . Dizziness     recurrent dizziness and falls  . Diabetes mellitus   . Hyperlipidemia   . Renal insufficiency     mild  . Dementia   . Depression   . CHF (congestive heart failure)   . GERD (gastroesophageal reflux disease)    Past Surgical History  Procedure Laterality Date  . Pacemaker placement    . Coronary stent placement     Family History  Problem  Relation Age of Onset  . Coronary artery disease Brother   . Diabetes Mother    History  Substance Use Topics  . Smoking status: Former Smoker    Quit date: 03/28/1960  . Smokeless tobacco: Not on file  . Alcohol Use: No    Review of Systems  Unable to perform ROS: Dementia      Allergies  Sulfa antibiotics; Sulfonamide derivatives; and Amiodarone hcl  Home Medications   Prior to Admission medications   Medication Sig Start Date End Date Taking? Authorizing Provider  acetaminophen (TYLENOL) 500 MG tablet Take 1,000 mg by mouth every 6 (six) hours as needed. pain    Historical Provider, MD  Artificial Tear Ointment (REFRESH P.M. OP) Place 0.5 application into both eyes 4 (four) times daily.    Historical Provider, MD  atorvastatin (LIPITOR) 10 MG tablet Take 10 mg by mouth daily.    Historical Provider, MD  brimonidine (ALPHAGAN) 0.2 % ophthalmic solution Place 1 drop into both eyes 2 (two) times daily. Wait 3-5 minutes between each drop    Historical Provider, MD  clopidogrel (PLAVIX) 75 MG tablet Take 75 mg by mouth daily.      Historical Provider, MD  dextromethorphan (DELSYM) 30 MG/5ML liquid Take 5 mLs by mouth daily as  needed for cough.    Historical Provider, MD  digoxin (LANOXIN) 0.25 MG tablet Take 250 mcg by mouth daily. *HOLD IF HEART RATE IS LESS THAN 60*    Historical Provider, MD  doxycycline (VIBRAMYCIN) 100 MG capsule Take 1 capsule (100 mg total) by mouth 2 (two) times daily. 06/25/13   Rolland Porter, MD  Emollient (EUCERIN) lotion Apply 1 mL topically at bedtime. To feet    Historical Provider, MD  ferrous sulfate 325 (65 FE) MG tablet Take 325 mg by mouth daily with breakfast.      Historical Provider, MD  fludrocortisone (FLORINEF) 0.1 MG tablet Take 0.1 mg by mouth 2 (two) times daily.     Historical Provider, MD  furosemide (LASIX) 20 MG tablet Take 20 mg by mouth daily.    Historical Provider, MD  latanoprost (XALATAN) 0.005 % ophthalmic solution Place 1 drop  into both eyes at bedtime.    Historical Provider, MD  metoprolol tartrate (LOPRESSOR) 25 MG tablet Take 25 mg by mouth 2 (two) times daily.    Historical Provider, MD  Multiple Vitamins-Minerals (I-VITE) TABS Take 1 tablet by mouth daily.    Historical Provider, MD  Multiple Vitamins-Minerals (RA THERAPEUTIC M PLUS BETA CAR) TABS Take 1 tablet by mouth daily.    Historical Provider, MD  omeprazole (PRILOSEC) 20 MG capsule Take 20 mg by mouth daily.    Historical Provider, MD  Polyethyl Glycol-Propyl Glycol (SYSTANE ULTRA) 0.4-0.3 % SOLN Place 1 drop into both eyes 4 (four) times daily.    Historical Provider, MD  potassium chloride SA (K-DUR,KLOR-CON) 20 MEQ tablet Take 20 mEq by mouth daily.      Historical Provider, MD  tobramycin-dexamethasone [facility]) ophthalmic ointment Place 0.5 application into the left eye 4 (four) times daily.    Historical Provider, MD  venlafaxine (EFFEXOR) 37.5 MG tablet Take 37.5 mg by mouth daily.    Historical Provider, MD   BP 155/80  Pulse 77  Temp(Src) 95.1 F (35.1 C) (Rectal)  Resp 18  SpO2 100% Physical Exam  Nursing note and vitals reviewed. Constitutional: He appears well-developed. No distress.  Appears chronically ill, unkempt  HENT:  Head: Normocephalic and atraumatic.  Eyes: Conjunctivae and EOM are normal. Pupils are equal, round, and reactive to light.  Neck: Normal range of motion. Neck supple. No JVD present.  Cardiovascular: Normal heart sounds.  Exam reveals no gallop.   No murmur heard. Pulmonary/Chest: Effort normal.  Abdominal: Soft. He exhibits distension (suprapubic distention).  Neurological: He is alert. He is disoriented. GCS eye subscore is 4. GCS verbal subscore is 5. GCS motor subscore is 6.  Skin: He is not diaphoretic.  Covered in bruises.  4cm skin tear R elbow. Large, tender hematoma R hip. Fingers are dark and dusky    ED Course  Procedures (including critical care time) Labs Review Labs Reviewed  CBC -  Abnormal; Notable for the following:    Hemoglobin 12.3 (*)    HCT 37.2 (*)    All other components within normal limits  URINALYSIS, ROUTINE W REFLEX MICROSCOPIC - Abnormal; Notable for the following:    APPearance TURBID (*)    pH 8.5 (*)    Hgb urine dipstick TRACE (*)    Protein, ur 30 (*)    Leukocytes, UA LARGE (*)    All other components within normal limits  COMPREHENSIVE METABOLIC PANEL - Abnormal; Notable for the following:    Glucose, Bld 129 (*)    BUN 28 (*)  Albumin 3.2 (*)    GFR calc non Af Amer 49 (*)    GFR calc Af Amer 57 (*)    All other components within normal limits  URINE MICROSCOPIC-ADD ON - Abnormal; Notable for the following:    Bacteria, UA MANY (*)    All other components within normal limits  PRO B NATRIURETIC PEPTIDE - Abnormal; Notable for the following:    Pro B Natriuretic peptide (BNP) 14620.0 (*)    All other components within normal limits  CBG MONITORING, ED - Abnormal; Notable for the following:    Glucose-Capillary 118 (*)    All other components within normal limits  URINE CULTURE  LACTIC ACID, PLASMA  TROPONIN I  DIGOXIN LEVEL  CK    Imaging Review No results found.   EKG Interpretation None       Date: 12/28/2013  Rate: 96   Rhythm: atrial fibrillation  QRS Axis: normal  Intervals: normal  ST/T Wave abnormalities: nonspecific T wave changes  Conduction Disutrbances:left anterior fascicular block  Narrative Interpretation:   Old EKG Reviewed: unchanged    MDM   Final diagnoses:  Fall, initial encounter  UTI (lower urinary tract infection)  Hypothermia, initial encounter  Chronic a-fib    6:36 AM BP 155/80  Pulse 77  Temp(Src) 95.1 F (35.1 C) (Rectal)  Resp 18  SpO2 100% Patien with mild HTN. unwitnessed fall, on blood thinners. Hypothermic upon arrival, discolored extremities, retaining URine. Given bear hugger and temp foley. hypoxia Concern for underlying infection (urinary, hcap) vs. Environmental  hypothermia. Also concern for rhabdo. Labs imgaing ordered.  9:09 AM Filed Vitals:   12/28/13 0551 12/28/13 0904 12/28/13 0906  BP: 155/80 167/81   Pulse: 77    Temp: 95.1 F (35.1 C)  96.4 F (35.8 C)  TempSrc: Rectal  Core (Comment)  Resp: 18 20   SpO2: 100%    patien twit UTI. Sent for culture. No elevatied WBC count, no hypotension. I feel his core temp is related to hypothermia.  Lungs appears to have edema. I doubt HCAP. Patient will be given IV rocephin. ADD on Pro BNP not seen by lab staff, however it is being run now. ekg shows chronic afi, rate controlled. Dig Levele and CK wnl. Normal lactate.  CMP shows elevated BUN, however CR appears at baseline. Elevated glucose. Patient admitted by Dr. Vanessa Barbara. Skin tear repaired by nursing staff.   Patient seen in shared visit with attending physician.  Pt stable in ED with no significant deterioration in condition.   I personally reviewed the imaging tests through PACS system. I have reviewed and interpreted Lab values. I reviewed available ER/hospitalization records through the EMR    [place], [place] 12/28/13 1610

## 2013-12-28 NOTE — ED Provider Notes (Signed)
Medical screening examination/treatment/procedure(s) were conducted as a shared visit with non-physician practitioner(s) and myself.  I personally evaluated the patient during the encounter.   EKG Interpretation   Date/Time:  Saturday December 28 2013 07:02:02 EDT Ventricular Rate:  96 PR Interval:  133 QRS Duration: 94 QT Interval:  377 QTC Calculation: 476 R Axis:   -11 Text Interpretation:  Atrial fibrillation Left anterior fasicular block  Nonspecific T wave abnormality No significant change since last tracing  Confirmed by [name]  MD, [name] ([id number]) on 12/28/2013 7:38:29 AM     I reviewed the history present illness and past medical history with physician assistant and performed physical examination. I agree with the management.  Arby BarretteMarcy Eden Rho, MD 12/28/13 [phone]

## 2013-12-28 NOTE — ED Notes (Signed)
Bed: WA09 Expected date: 12/28/13 Expected time: 5:26 AM Means of arrival: Ambulance Comments: 80 yo M  Fall

## 2013-12-28 NOTE — ED Notes (Signed)
Per EMS, [facility] with [facility] of [facility], not witnessed , pt. Found lying on the floor beside pt.'s bed, pt. Has dementia , has a laceration  on right arm below elbow , bleeding controlled with dressing, also reported of hematoma on left iliac.

## 2013-12-28 NOTE — H&P (Signed)
[facility] History and Physical  ALA CAPRI ZOX:[id number] DOB: 02/04/1923 DOA: 12/28/2013  Referring physician:  PCP: Florentina Jenny, MD   Chief Complaint: Fall  HPI: [name] [name] is a 80 y.o. male with a past medical history of dementia, currently resident of a skilled nursing facility, chronic atrial fibrillation, chronic systolic congestive heart failure, stage III chronic kidney disease, who presented as a transfer from a skilled nursing facility. Patient having a history of dementia and unable to provide history or participate in his own plan of care. History was obtained from his daughter present at bedside along with emergency room staff. Patient was in his usual state of health having an unwitnessed fall in his bedroom. He was found early this morning by nursing staff on the floor, unknown length of time that he was down for. His daughter believes that he likely fell on his way to use the restroom over night. On arrival he was felt to be hypothermic and temperature of 95.1. Imaging studies performed in the ER did not reveal evidence of acute fracture. Urinalysis did reveal urinary tract infection as he was administered ceftriaxone.   At baseline he has advanced dementia, uses a walker to ambulate, is able to feed himself, but requires assistance with bathing and dressing.                                                                                                                                                                                                                                                                                                                                                                    Review of Systems:  Unable to obtain reliable review of systems given advanced dementia. Patient stating that he "feels fine"   Past Medical History  Diagnosis Date  . Cardiac pacemaker in situ     s/p Viatron DDD Pacemaker for sinus node dysfunction  .  Orthostatic hypotension   . Hyperlipidemia     mixed  . Atrial fibrillation     not felt to be a coumadin candidate due to falls  . Sinoatrial node dysfunction     s/p PPM (MDT)  . Coronary atherosclerosis of native coronary artery     s/p AWMI 2005 stent LAD  . Diabetes mellitus, type 2   . Chronic renal insufficiency, stage II (mild)   . Intolerance, drug     amniodarone  . Upper GI hemorrhage     2006 Mallory Weiss Tear  . MI (myocardial infarction)     coronary artery status post anterior wall myocardial infarction in 2005 treated with a stent to the left anterior descending.  . Dizziness     recurrent dizziness and falls  . Diabetes mellitus   . Hyperlipidemia   . Renal insufficiency     mild  . Dementia   . Depression   . CHF (congestive heart failure)   . GERD (gastroesophageal reflux disease)    Past Surgical History  Procedure Laterality Date  . Pacemaker placement    . Coronary stent placement     Social History:  reports that he quit smoking about 53 years ago. He does not have any smokeless tobacco history on file. He reports that he does not drink alcohol or use illicit drugs.  Allergies  Allergen Reactions  . Sulfa Antibiotics     Unknown  . Sulfonamide Derivatives     REACTION: hives  . Amiodarone Hcl Rash    Family History  Problem Relation Age of Onset  . Coronary artery disease Brother   . Diabetes Mother      Prior to Admission medications   Medication Sig Start Date End Date Taking? Authorizing Provider  acetaminophen (TYLENOL) 500 MG tablet Take 1,000 mg by mouth every 6 (six) hours as needed. pain    Historical Provider, MD  Artificial Tear Ointment (REFRESH P.M. OP) Place 0.5 application into both eyes 4 (four) times daily.    Historical Provider, MD  atorvastatin (LIPITOR) 10 MG tablet Take 10 mg by mouth daily.    Historical Provider, MD  brimonidine (ALPHAGAN) 0.2 % ophthalmic solution Place 1 drop into both eyes 2 (two) times daily.  Wait 3-5 minutes between each drop    Historical Provider, MD  clopidogrel (PLAVIX) 75 MG tablet Take 75 mg by mouth daily.      Historical Provider, MD  dextromethorphan (DELSYM) 30 MG/5ML liquid Take 5 mLs by mouth daily as needed for cough.    Historical Provider, MD  digoxin (LANOXIN) 0.25 MG tablet Take 250 mcg by mouth daily. *HOLD IF HEART RATE IS LESS THAN 60*    Historical Provider, MD  doxycycline (VIBRAMYCIN) 100 MG capsule Take 1 capsule (100 mg total) by mouth 2 (two) times daily. 06/25/13   Rolland PorterMark James, MD  Emollient (EUCERIN) lotion Apply 1 mL topically at bedtime. To feet    Historical Provider, MD  ferrous sulfate 325 (65 FE) MG tablet Take 325 mg by mouth daily with breakfast.      Historical Provider, MD  fludrocortisone (FLORINEF) 0.1 MG tablet Take 0.1 mg by mouth 2 (two) times daily.     Historical Provider, MD  furosemide (LASIX) 20 MG tablet Take 20 mg by mouth daily.    Historical Provider, MD  latanoprost (XALATAN) 0.005 %  ophthalmic solution Place 1 drop into both eyes at bedtime.    Historical Provider, MD  metoprolol tartrate (LOPRESSOR) 25 MG tablet Take 25 mg by mouth 2 (two) times daily.    Historical Provider, MD  Multiple Vitamins-Minerals (I-VITE) TABS Take 1 tablet by mouth daily.    Historical Provider, MD  Multiple Vitamins-Minerals (RA THERAPEUTIC M PLUS BETA CAR) TABS Take 1 tablet by mouth daily.    Historical Provider, MD  omeprazole (PRILOSEC) 20 MG capsule Take 20 mg by mouth daily.    Historical Provider, MD  Polyethyl Glycol-Propyl Glycol (SYSTANE ULTRA) 0.4-0.3 % SOLN Place 1 drop into both eyes 4 (four) times daily.    Historical Provider, MD  potassium chloride SA (K-DUR,KLOR-CON) 20 MEQ tablet Take 20 mEq by mouth daily.      Historical Provider, MD  tobramycin-dexamethasone [facility]) ophthalmic ointment Place 0.5 application into the left eye 4 (four) times daily.    Historical Provider, MD  venlafaxine (EFFEXOR) 37.5 MG tablet Take 37.5 mg by mouth  daily.    Historical Provider, MD   Physical Exam: Filed Vitals:   12/28/13 0551 12/28/13 0904 12/28/13 0906  BP: 155/80 167/81   Pulse: 77    Temp: 95.1 F (35.1 C)  96.4 F (35.8 C)  TempSrc: Rectal  Core (Comment)  Resp: 18 20   SpO2: 100%      Wt Readings from Last 3 Encounters:  12/24/13 58.06 kg (128 lb)  03/28/13 53.9 kg (118 lb 13.3 oz)  12/20/12 56.246 kg (124 lb)    General:  He is awake and alert, oriented x3  Eyes: PERRL, normal lids, irises & conjunctiva ENT: grossly normal hearing, lips & tongue Neck: no LAD, masses or thyromegaly, has distended jugular veins  Cardiovascular:  Irregular rate and rhythm, no m/r/g. 1+ bilateral pedal edema Telemetry:  Atrial fibrillation Respiratory: CTA bilaterally, no w/r/r. Normal respiratory effort, has a few bibasal Abdomen: soft, ntnd Skin:  Has a skin tear to right elbow, bilateral hematomas to upper extremities  Musculoskeletal: grossly normal tone BUE/BLE Psychiatric: patient is awake and alert although confused and disoriented. Follow some commands  Neurologic: grossly non-focal.          Labs on Admission:  Basic Metabolic Panel:  Recent Labs Lab 12/28/13 0704  NA 142  K 4.0  CL 103  CO2 26  GLUCOSE 129*  BUN 28*  CREATININE 1.26  CALCIUM 8.8   Liver Function Tests:  Recent Labs Lab 12/28/13 0704  AST 35  ALT 28  ALKPHOS 110  BILITOT 1.1  PROT 7.2  ALBUMIN 3.2*   No results found for this basename: LIPASE, AMYLASE,  in the last 168 hours No results found for this basename: AMMONIA,  in the last 168 hours CBC:  Recent Labs Lab 12/28/13 0702  WBC 6.6  HGB 12.3*  HCT 37.2*  MCV 88.2  PLT 192   Cardiac Enzymes:  Recent Labs Lab 12/28/13 0704  CKTOTAL 156  TROPONINI <0.30    BNP (last 3 results)  Recent Labs  12/28/13 0700  PROBNP 14620.0*   CBG:  Recent Labs Lab 12/28/13 0703  GLUCAP 118*    Radiological Exams on Admission: Dg Chest 1 View  12/28/2013   CLINICAL  DATA:  Fall.  Hypoxia.  EXAM: CHEST - 1 VIEW  COMPARISON:  03/01/2012  FINDINGS: Cardiac enlargement with mild pulmonary vascular congestion. Hazy infiltrates in the mid and lower lung zones probably representing edema although pneumonia could also have this appearance. Small  bilateral pleural effusions. No pneumothorax. Cardiac pacemaker is unchanged in position. Calcified and tortuous aorta.  IMPRESSION: Cardiac enlargement with mild pulmonary vascular congestion and bilateral edema and effusions.   Electronically Signed   By: Burman Nieves M.D.   On: 12/28/2013 07:02   Dg Hip Complete Right  12/28/2013   CLINICAL DATA:  Fall.  EXAM: RIGHT HIP - COMPLETE 2+ VIEW  COMPARISON:  None.  FINDINGS: Degenerative changes in the lower lumbar spine and hips. No evidence of acute fracture or dislocation. Pelvis appears intact.  IMPRESSION: No acute bony abnormalities demonstrated in the right hip.   Electronically Signed   By: Burman Nieves M.D.   On: 12/28/2013 06:56   Ct Head Wo Contrast  12/28/2013   CLINICAL DATA:  Unwitnessed fall. History of dementia. Severe headache. Laceration on the right arm.  EXAM: CT HEAD WITHOUT CONTRAST  CT CERVICAL SPINE WITHOUT CONTRAST  TECHNIQUE: Multidetector CT imaging of the head and cervical spine was performed following the standard protocol without intravenous contrast. Multiplanar CT image reconstructions of the cervical spine were also generated.  COMPARISON:  CT head 06/25/2013. CT head and cervical spine 09/11/2012.  FINDINGS: CT HEAD FINDINGS  Diffuse cerebral atrophy. Ventricular dilatation consistent with central atrophy. Low-attenuation changes in the deep white matter consistent with small vessel ischemia. Old of lacunar infarct in the periventricular white matter on the left. No mass effect or midline shift. No abnormal extra-axial fluid collections. Gray-white matter junctions are distinct. Basal cisterns are not effaced. No evidence of acute intracranial  hemorrhage. No depressed skull fractures. Visualized paranasal sinuses and mastoid air cells are not opacified.  CT CERVICAL SPINE FINDINGS  Diffuse degenerative changes throughout the cervical spine with narrowed cervical interspaces and endplate hypertrophic changes throughout. Degenerative changes in the cervical facet joints. There is mild retrolisthesis of C3 on C4, mild anterolisthesis of C4 on C5, and mild retrolisthesis of C5 on C6. Alignment is unchanged since previous study and likely due to degenerative change. Normal alignment of the facet joints. No vertebral compression deformities. No prevertebral soft tissue swelling. C1-2 articulation appears intact. No focal bone lesion or bone destruction. Soft tissues are unremarkable. Pleural thickening, emphysematous changes common scarring in the lung apices.  IMPRESSION: No acute intracranial abnormalities. Diffuse cerebral atrophy and small vessel ischemic changes.  Degenerative changes throughout the cervical spine. Minimal vertebral subluxations present, likely representing degenerative change. Ligamentous injury not entirely excluded but appearance is stable since 2014. No displaced fractures identified.   Electronically Signed   By: Burman Nieves M.D.   On: 12/28/2013 06:53   Ct Cervical Spine Wo Contrast  12/28/2013   CLINICAL DATA:  Unwitnessed fall. History of dementia. Severe headache. Laceration on the right arm.  EXAM: CT HEAD WITHOUT CONTRAST  CT CERVICAL SPINE WITHOUT CONTRAST  TECHNIQUE: Multidetector CT imaging of the head and cervical spine was performed following the standard protocol without intravenous contrast. Multiplanar CT image reconstructions of the cervical spine were also generated.  COMPARISON:  CT head 06/25/2013. CT head and cervical spine 09/11/2012.  FINDINGS: CT HEAD FINDINGS  Diffuse cerebral atrophy. Ventricular dilatation consistent with central atrophy. Low-attenuation changes in the deep white matter consistent with  small vessel ischemia. Old of lacunar infarct in the periventricular white matter on the left. No mass effect or midline shift. No abnormal extra-axial fluid collections. Gray-white matter junctions are distinct. Basal cisterns are not effaced. No evidence of acute intracranial hemorrhage. No depressed skull fractures. Visualized paranasal sinuses and mastoid air  cells are not opacified.  CT CERVICAL SPINE FINDINGS  Diffuse degenerative changes throughout the cervical spine with narrowed cervical interspaces and endplate hypertrophic changes throughout. Degenerative changes in the cervical facet joints. There is mild retrolisthesis of C3 on C4, mild anterolisthesis of C4 on C5, and mild retrolisthesis of C5 on C6. Alignment is unchanged since previous study and likely due to degenerative change. Normal alignment of the facet joints. No vertebral compression deformities. No prevertebral soft tissue swelling. C1-2 articulation appears intact. No focal bone lesion or bone destruction. Soft tissues are unremarkable. Pleural thickening, emphysematous changes common scarring in the lung apices.  IMPRESSION: No acute intracranial abnormalities. Diffuse cerebral atrophy and small vessel ischemic changes.  Degenerative changes throughout the cervical spine. Minimal vertebral subluxations present, likely representing degenerative change. Ligamentous injury not entirely excluded but appearance is stable since 2014. No displaced fractures identified.   Electronically Signed   By: Burman Nieves M.D.   On: 12/28/2013 06:53    EKG: Independently reviewed. EKG showed atrial fibrillation with ventricular rate of 96  Assessment/Plan Principal Problem:   Sepsis Active Problems:   UTI (lower urinary tract infection)   Atrial fibrillation   Multiple falls   CKD (chronic kidney disease) stage 3, GFR 30-59 ml/min   Chronic systolic CHF (congestive heart failure)   Hypothermia   1. Sepsis, present on admission evidence  by hypothermia with temperature of 95.1, heart rate of 105, with source of infection likely to be urinary tract. Will obtain blood cultures urine cultures, start empiric antibiotic therapy with ceftriaxone 1 g IV every 24 hours, provide supportive care, followup on blood cultures. 2. Urinary tract infection. I suspect this likely contributed to his weakness and functional decline resultant fall overnight. Will start empiric antithrombotic therapy with ceftriaxone 1 g IV every 24 hours, followup blood cultures. Her medical records she has a history of urinary tract infections with previous cultures growing Escherichia coli. This organism was susceptible to beta lactams and fluoroquinolones. 3. Functional decline. Patient had a fall, would benefit from physical therapy and early mobilization. PT consult placed. Imagining did not reveal acute fracture.  4. Type 2 diabetes mellitus. Will perform Accu-Cheks q. a.c. and each bedtime with sliding scale coverage 5. Chronic systolic congestive heart failure. Initial labs showing a BNP of 14,620, last transthoracic echocardiogram performed on 10/19/2009 showed ejection fraction of 45-50%. CXR in ER showed mild pulmonary congestion and was given 40 mg of IV lasix. Will continue his home dose of lasix at 20 mg PO q daily.  6. Chronic atrial fibrillation. Continue metoprolol 25 mg PO twice a day and digoxin 0.25 mg daily, will monitor kidney function. Place him on telemetry.  7. Stage III CKD. Kidney function stable, will monitor.  8. DVT Proph. [place] [facility]   Code Status: DNR Family Communication: Spoke with her daughter present at bedside Disposition Plan: Admit to the inpatient service, anticipate will require greater than 2 nights hospitalization  Time spent: 70 min  Jeralyn Bennett [facility] Pager [phone]

## 2013-12-29 DIAGNOSIS — N183 Chronic kidney disease, stage 3 (moderate): Secondary | ICD-10-CM

## 2013-12-29 DIAGNOSIS — R296 Repeated falls: Secondary | ICD-10-CM

## 2013-12-29 LAB — BASIC METABOLIC PANEL
ANION GAP: 12 (ref 5–15)
BUN: 30 mg/dL — ABNORMAL HIGH (ref 6–23)
CALCIUM: 9 mg/dL (ref 8.4–10.5)
CO2: 27 meq/L (ref 19–32)
Chloride: 102 mEq/L (ref 96–112)
Creatinine, Ser: 1.31 mg/dL (ref 0.50–1.35)
GFR calc Af Amer: 54 mL/min — ABNORMAL LOW (ref >90)
GFR, EST NON AFRICAN AMERICAN: 47 mL/min — AB (ref >90)
Glucose, Bld: 126 mg/dL — ABNORMAL HIGH (ref 70–99)
Potassium: 3.8 mEq/L (ref 3.7–5.3)
SODIUM: 141 meq/L (ref 137–147)

## 2013-12-29 LAB — CBC
HCT: 40.1 % (ref 39.0–52.0)
Hemoglobin: 12.9 g/dL — ABNORMAL LOW (ref 13.0–17.0)
MCH: 28.9 pg (ref 26.0–34.0)
MCHC: 32.2 g/dL (ref 30.0–36.0)
MCV: 89.7 fL (ref 78.0–100.0)
Platelets: 195 10*3/uL (ref 150–400)
RBC: 4.47 MIL/uL (ref 4.22–5.81)
RDW: 14.8 % (ref 11.5–15.5)
WBC: 5.3 10*3/uL (ref 4.0–10.5)

## 2013-12-29 LAB — GLUCOSE, CAPILLARY
Glucose-Capillary: 107 mg/dL — ABNORMAL HIGH (ref 70–99)
Glucose-Capillary: 129 mg/dL — ABNORMAL HIGH (ref 70–99)
Glucose-Capillary: 95 mg/dL (ref 70–99)

## 2013-12-29 NOTE — Progress Notes (Signed)
[facility] PROGRESS NOTE  JESSICA SEIDMAN ZOX:[id number] DOB: Aug 13, 1922 DOA: 12/28/2013 PCP: Florentina Jenny, MD  Assessment/Plan: 1. Sepsis -Present on admission, evidenced by encephalopathy, hypothermia, with source of infection likely to be UTI.  -Showing clinical improvement today with resolution of his hypothermia.  -Continue IV Rocephin, will follow up on blood cultures and urine cultures.   2.  UTI -Likely precipitated fall and functional decline -Pending urine and blood cultures, will continue IV Rocephin -Continue supportive care  3. Failure to thrive/Functional Decline -Likely precipitated by underlying infectious process -He was ambulated to hallway this morning, Pt has been consulted.  -Early mobilization  4.  Chronic Systolic CHF -Compensated -Will continue Lasix 20 mg PO q daily  5. Chronic Atrial Fibrillation -Remains rate controlled -Continue Metoprolol and digoxin  Code Status: DNR Family Communication: Spoke to his daughter over telephone Disposition Plan: Patient showing improvement since admission, anticipate discharge to SNF in the next 24 to 48 hours   Consultants:  PT  Antibiotics:  Rocephin  HPI/Subjective: Patient is a pleasant 80 year old, nursing home patient, admitted to the medicine service on 12/28/2013 presenting with sepsis, failure to thrive, found on the floor of his bedroom by nursing home staff. Initial labs revealed the presence of sepsis with source of infection likely to be UTI. He was started emperic IV antibiotic therapy with Rocephin.  Patient seems more awake and alert today, ambulated him to the hallway.   Objective: Filed Vitals:   12/29/13 0442  BP: 148/70  Pulse: 65  Temp: 97.9 F (36.6 C)  Resp: 20    Intake/Output Summary (Last 24 hours) at 12/29/13 1135 Last data filed at 12/29/13 0900  Gross per 24 hour  Intake    770 ml  Output   3600 ml  Net  -2830 ml   Filed Weights   12/28/13 1100 12/29/13 1100   Weight: 55.157 kg (121 lb 9.6 oz) 52.4 kg (115 lb 8.3 oz)    Exam:   General:  Appears better, ambulated to the hallway  Cardiovascular: Regular rate and rhythm, normal S1S2  Respiratory: Normal inspiratory effort, has a few crackles at bases  Abdomen: Soft, nontender nondistended  Musculoskeletal: no edema  Data Reviewed: Basic Metabolic Panel:  Recent Labs Lab 12/28/13 0704 12/29/13 0535  NA 142 141  K 4.0 3.8  CL 103 102  CO2 26 27  GLUCOSE 129* 126*  BUN 28* 30*  CREATININE 1.26 1.31  CALCIUM 8.8 9.0   Liver Function Tests:  Recent Labs Lab 12/28/13 0704  AST 35  ALT 28  ALKPHOS 110  BILITOT 1.1  PROT 7.2  ALBUMIN 3.2*   No results found for this basename: LIPASE, AMYLASE,  in the last 168 hours No results found for this basename: AMMONIA,  in the last 168 hours CBC:  Recent Labs Lab 12/28/13 0702 12/29/13 0535  WBC 6.6 5.3  HGB 12.3* 12.9*  HCT 37.2* 40.1  MCV 88.2 89.7  PLT 192 195   Cardiac Enzymes:  Recent Labs Lab 12/28/13 0704  CKTOTAL 156  TROPONINI <0.30   BNP (last 3 results)  Recent Labs  12/28/13 0700  PROBNP 14620.0*   CBG:  Recent Labs Lab 12/28/13 0703 12/28/13 1201 12/28/13 1644 12/28/13 2149 12/29/13 0724  GLUCAP 118* 85 142* 160* 107*    Recent Results (from the past 240 hour(s))  MRSA PCR SCREENING     Status: None   Collection Time    12/28/13 11:41 AM  Result Value Ref Range Status   MRSA by PCR NEGATIVE  NEGATIVE Final   Comment:            The GeneXpert MRSA Assay (FDA     approved for NASAL specimens     only), is one component of a     comprehensive MRSA colonization     surveillance program. It is not     intended to diagnose MRSA     infection nor to guide or     monitor treatment for     MRSA infections.  CULTURE, BLOOD (ROUTINE X 2)     Status: None   Collection Time    12/28/13 11:45 AM      Result Value Ref Range Status   Specimen Description BLOOD RIGHT ARM   Final    Special Requests BOTTLES DRAWN AEROBIC ONLY 10 CC   Final   Culture  Setup Time     Final   Value: 12/28/2013 20:33     Performed at [facility]   Culture     Final   Value:        BLOOD CULTURE RECEIVED NO GROWTH TO DATE CULTURE WILL BE HELD FOR 5 DAYS BEFORE ISSUING A FINAL NEGATIVE REPORT     Performed at [facility]   Report Status PENDING   Incomplete  CULTURE, BLOOD (ROUTINE X 2)     Status: None   Collection Time    12/28/13 11:58 AM      Result Value Ref Range Status   Specimen Description BLOOD LEFT HAND   Final   Special Requests BOTTLES DRAWN AEROBIC ONLY 10 CC   Final   Culture  Setup Time     Final   Value: 12/28/2013 20:33     Performed at [facility]   Culture     Final   Value:        BLOOD CULTURE RECEIVED NO GROWTH TO DATE CULTURE WILL BE HELD FOR 5 DAYS BEFORE ISSUING A FINAL NEGATIVE REPORT     Performed at [facility]   Report Status PENDING   Incomplete     Studies: Dg Chest 1 View  12/28/2013   CLINICAL DATA:  Fall.  Hypoxia.  EXAM: CHEST - 1 VIEW  COMPARISON:  03/01/2012  FINDINGS: Cardiac enlargement with mild pulmonary vascular congestion. Hazy infiltrates in the mid and lower lung zones probably representing edema although pneumonia could also have this appearance. Small bilateral pleural effusions. No pneumothorax. Cardiac pacemaker is unchanged in position. Calcified and tortuous aorta.  IMPRESSION: Cardiac enlargement with mild pulmonary vascular congestion and bilateral edema and effusions.   Electronically Signed   By: Burman Nieves M.D.   On: 12/28/2013 07:02   Dg Hip Complete Right  12/28/2013   CLINICAL DATA:  Fall.  EXAM: RIGHT HIP - COMPLETE 2+ VIEW  COMPARISON:  None.  FINDINGS: Degenerative changes in the lower lumbar spine and hips. No evidence of acute fracture or dislocation. Pelvis appears intact.  IMPRESSION: No acute bony abnormalities demonstrated in the right hip.   Electronically Signed   By: Burman Nieves M.D.   On: 12/28/2013 06:56   Ct Head Wo Contrast  12/28/2013   CLINICAL DATA:  Unwitnessed fall. History of dementia. Severe headache. Laceration on the right arm.  EXAM: CT HEAD WITHOUT CONTRAST  CT CERVICAL SPINE WITHOUT CONTRAST  TECHNIQUE: Multidetector CT imaging of the head and cervical spine was performed following the standard protocol  without intravenous contrast. Multiplanar CT image reconstructions of the cervical spine were also generated.  COMPARISON:  CT head 06/25/2013. CT head and cervical spine 09/11/2012.  FINDINGS: CT HEAD FINDINGS  Diffuse cerebral atrophy. Ventricular dilatation consistent with central atrophy. Low-attenuation changes in the deep white matter consistent with small vessel ischemia. Old of lacunar infarct in the periventricular white matter on the left. No mass effect or midline shift. No abnormal extra-axial fluid collections. Gray-white matter junctions are distinct. Basal cisterns are not effaced. No evidence of acute intracranial hemorrhage. No depressed skull fractures. Visualized paranasal sinuses and mastoid air cells are not opacified.  CT CERVICAL SPINE FINDINGS  Diffuse degenerative changes throughout the cervical spine with narrowed cervical interspaces and endplate hypertrophic changes throughout. Degenerative changes in the cervical facet joints. There is mild retrolisthesis of C3 on C4, mild anterolisthesis of C4 on C5, and mild retrolisthesis of C5 on C6. Alignment is unchanged since previous study and likely due to degenerative change. Normal alignment of the facet joints. No vertebral compression deformities. No prevertebral soft tissue swelling. C1-2 articulation appears intact. No focal bone lesion or bone destruction. Soft tissues are unremarkable. Pleural thickening, emphysematous changes common scarring in the lung apices.  IMPRESSION: No acute intracranial abnormalities. Diffuse cerebral atrophy and small vessel ischemic changes.  Degenerative  changes throughout the cervical spine. Minimal vertebral subluxations present, likely representing degenerative change. Ligamentous injury not entirely excluded but appearance is stable since 2014. No displaced fractures identified.   Electronically Signed   By: Burman Nieves M.D.   On: 12/28/2013 06:53   Ct Cervical Spine Wo Contrast  12/28/2013   CLINICAL DATA:  Unwitnessed fall. History of dementia. Severe headache. Laceration on the right arm.  EXAM: CT HEAD WITHOUT CONTRAST  CT CERVICAL SPINE WITHOUT CONTRAST  TECHNIQUE: Multidetector CT imaging of the head and cervical spine was performed following the standard protocol without intravenous contrast. Multiplanar CT image reconstructions of the cervical spine were also generated.  COMPARISON:  CT head 06/25/2013. CT head and cervical spine 09/11/2012.  FINDINGS: CT HEAD FINDINGS  Diffuse cerebral atrophy. Ventricular dilatation consistent with central atrophy. Low-attenuation changes in the deep white matter consistent with small vessel ischemia. Old of lacunar infarct in the periventricular white matter on the left. No mass effect or midline shift. No abnormal extra-axial fluid collections. Gray-white matter junctions are distinct. Basal cisterns are not effaced. No evidence of acute intracranial hemorrhage. No depressed skull fractures. Visualized paranasal sinuses and mastoid air cells are not opacified.  CT CERVICAL SPINE FINDINGS  Diffuse degenerative changes throughout the cervical spine with narrowed cervical interspaces and endplate hypertrophic changes throughout. Degenerative changes in the cervical facet joints. There is mild retrolisthesis of C3 on C4, mild anterolisthesis of C4 on C5, and mild retrolisthesis of C5 on C6. Alignment is unchanged since previous study and likely due to degenerative change. Normal alignment of the facet joints. No vertebral compression deformities. No prevertebral soft tissue swelling. C1-2 articulation appears  intact. No focal bone lesion or bone destruction. Soft tissues are unremarkable. Pleural thickening, emphysematous changes common scarring in the lung apices.  IMPRESSION: No acute intracranial abnormalities. Diffuse cerebral atrophy and small vessel ischemic changes.  Degenerative changes throughout the cervical spine. Minimal vertebral subluxations present, likely representing degenerative change. Ligamentous injury not entirely excluded but appearance is stable since 2014. No displaced fractures identified.   Electronically Signed   By: Burman Nieves M.D.   On: 12/28/2013 06:53    Scheduled Meds: . atorvastatin  10 mg Oral Daily  . brimonidine  1 drop Both Eyes BID  . cefTRIAXone (ROCEPHIN)  IV  1 g Intravenous Q24H  . clopidogrel  75 mg Oral Daily  . digoxin  250 mcg Oral Daily  . fludrocortisone  0.1 mg Oral BID  . furosemide  20 mg Oral Daily  . heparin  5,000 Units Subcutaneous 3 times per day  . hydrocortisone cream   Topical BID  . insulin aspart  0-15 Units Subcutaneous TID WC  . insulin aspart  0-5 Units Subcutaneous QHS  . latanoprost  1 drop Both Eyes QHS  . metoprolol tartrate  25 mg Oral BID  . polyvinyl alcohol  1 drop Both Eyes QID  . potassium chloride SA  20 mEq Oral Daily  . sodium chloride  3 mL Intravenous Q12H  . sodium chloride  3 mL Intravenous Q12H  . tobramycin-dexamethasone  1 application Left Eye QID  . venlafaxine  37.5 mg Oral Daily   Continuous Infusions:   Principal Problem:   Sepsis Active Problems:   UTI (lower urinary tract infection)   Atrial fibrillation   Multiple falls   CKD (chronic kidney disease) stage 3, GFR 30-59 ml/min   Chronic systolic CHF (congestive heart failure)   Hypothermia    Time spent: 35 min    Ambrosio Reuter  [facility] Pager [phone]. If 7PM-7AM, please contact night-coverage at [email or web address], password [email or web address] 12/29/2013, 11:35 AM  LOS: 1 day

## 2013-12-29 NOTE — Evaluation (Signed)
Physical Therapy Evaluation Patient Details Name: [name] [name] MRN: [id number] DOB: 1922-12-26 Today's Date: 12/29/2013   History of Present Illness  80 yo male admitted with sepsis, fall. Hx of DM, dementia, Afib, pacemaker, CAD, MI. Pt is from memory care unit at [facility].  Clinical Impression  On eval, pt required Min assist for mobility-able to ambulate ~60 feet with rolling walker. Pt c/o R LE pain (around hip area) during ambulation so distance was limited. LOB x1 posteriorly with static standing.     Follow Up Recommendations Home health PT (at facility as long as they can provide current level of care)    Equipment Recommendations  None recommended by PT    Recommendations for Other Services       Precautions / Restrictions Precautions Precautions: Fall Restrictions Weight Bearing Restrictions: No      Mobility  Bed Mobility               General bed mobility comments: pt sitting in recliner  Transfers Overall transfer level: Needs assistance Equipment used: Rolling walker (2 wheeled) Transfers: Sit to/from Stand Sit to Stand: Min assist         General transfer comment: assist to rise, stabilize, control descent. LOB posteriorly with static standing requiring external assist to stabilize.   Ambulation/Gait Ambulation/Gait assistance: Min assist Ambulation Distance (Feet): 60 Feet Assistive device: Rolling walker (2 wheeled) Gait Pattern/deviations: Step-through pattern;Decreased stride length;Trunk flexed;Antalgic     General Gait Details: assist to stabilize intermittently. Pt c/o R hip pain with ambulation so distance was limited.   Stairs            Wheelchair Mobility    Modified Rankin (Stroke Patients Only)       Balance Overall balance assessment: Needs assistance;History of Falls         Standing balance support: Bilateral upper extremity supported;During functional activity Standing balance-Leahy Scale: Poor                                Pertinent Vitals/Pain Pain Assessment: Faces Faces Pain Scale: Hurts even more Pain Location: R hip when ambulating Pain Intervention(s): Monitored during session;Limited activity within patient's tolerance    Home Living Family/patient expects to be discharged to:: Assisted living               Home Equipment: Walker - 2 wheels      Prior Function Level of Independence: Needs assistance               Hand Dominance        Extremity/Trunk Assessment   Upper Extremity Assessment: Generalized weakness           Lower Extremity Assessment: Generalized weakness      Cervical / Trunk Assessment: Kyphotic  Communication   Communication: HOH  Cognition Arousal/Alertness: Awake/alert Behavior During Therapy: WFL for tasks assessed/performed Overall Cognitive Status: History of cognitive impairments - at baseline                      General Comments      Exercises        Assessment/Plan    PT Assessment Patient needs continued PT services  PT Diagnosis Difficulty walking;Abnormality of gait;Acute pain;Generalized weakness   PT Problem List Decreased strength;Decreased activity tolerance;Decreased balance;Decreased mobility;Decreased cognition;Pain;Decreased knowledge of use of DME  PT Treatment Interventions DME instruction;Gait training;Functional mobility training;Therapeutic activities;Therapeutic exercise;Patient/family education;Balance training  PT Goals (Current goals can be found in the Care Plan section) Acute Rehab PT Goals Patient Stated Goal: none stated Time For Goal Achievement: 01/12/14 Potential to Achieve Goals: Fair    Frequency Min 3X/week   Barriers to discharge        Co-evaluation               End of Session Equipment Utilized During Treatment: Gait belt Activity Tolerance: Patient limited by pain Patient left: in chair;with chair alarm set;with call bell/phone within  reach           Time: 0921-0931 PT Time Calculation (min): 10 min   Charges:   PT Evaluation $Initial PT Evaluation Tier I: 1 Procedure PT Treatments $Gait Training: 8-22 mins   PT G Codes:          Rebeca AlertJannie [name] [name], MPT Pager: [phone]

## 2013-12-30 DIAGNOSIS — I4891 Unspecified atrial fibrillation: Secondary | ICD-10-CM

## 2013-12-30 DIAGNOSIS — Z9181 History of falling: Secondary | ICD-10-CM

## 2013-12-30 LAB — GLUCOSE, CAPILLARY
GLUCOSE-CAPILLARY: 157 mg/dL — AB (ref 70–99)
Glucose-Capillary: 144 mg/dL — ABNORMAL HIGH (ref 70–99)
Glucose-Capillary: 87 mg/dL (ref 70–99)

## 2013-12-30 LAB — CBC
HCT: 38.3 % — ABNORMAL LOW (ref 39.0–52.0)
Hemoglobin: 12.5 g/dL — ABNORMAL LOW (ref 13.0–17.0)
MCH: 28.9 pg (ref 26.0–34.0)
MCHC: 32.6 g/dL (ref 30.0–36.0)
MCV: 88.7 fL (ref 78.0–100.0)
PLATELETS: 181 10*3/uL (ref 150–400)
RBC: 4.32 MIL/uL (ref 4.22–5.81)
RDW: 14.8 % (ref 11.5–15.5)
WBC: 5.1 10*3/uL (ref 4.0–10.5)

## 2013-12-30 LAB — BASIC METABOLIC PANEL
Anion gap: 12 (ref 5–15)
BUN: 33 mg/dL — AB (ref 6–23)
CALCIUM: 8.8 mg/dL (ref 8.4–10.5)
CO2: 27 mEq/L (ref 19–32)
CREATININE: 1.24 mg/dL (ref 0.50–1.35)
Chloride: 101 mEq/L (ref 96–112)
GFR calc Af Amer: 58 mL/min — ABNORMAL LOW (ref >90)
GFR, EST NON AFRICAN AMERICAN: 50 mL/min — AB (ref >90)
GLUCOSE: 118 mg/dL — AB (ref 70–99)
Potassium: 3.9 mEq/L (ref 3.7–5.3)
Sodium: 140 mEq/L (ref 137–147)

## 2013-12-30 LAB — URINE CULTURE: Colony Count: >=100000

## 2013-12-30 MED ORDER — CEFUROXIME AXETIL 250 MG PO TABS: 250.0000 mg | ORAL_TABLET | Freq: Two times a day (BID) | ORAL | Status: DC

## 2013-12-30 MED ORDER — HYDROCORTISONE 1 % EX CREA: TOPICAL_CREAM | Freq: Two times a day (BID) | CUTANEOUS | Status: DC

## 2013-12-30 NOTE — Progress Notes (Signed)
Pt. [facility]

## 2013-12-30 NOTE — Progress Notes (Signed)
[facility] [address] [facility]. Patient & daughter, Dois DavenportSandra (ph#: [phone]) aware. [facility], [place].   [facility] will be called once MD signs FL2 & DNR.   Lincoln MaxinKelly Jamayah Myszka, LCSW [facility] Clinical Social Worker cell #: [phone]

## 2013-12-30 NOTE — Discharge Summary (Addendum)
Physician Discharge Summary  [name] LenisHoward H Feenstra BJY:[id number] DOB: 1923/02/08 DOA: 12/28/2013  PCP: Florentina JennyRIPP, HENRY, MD  Admit date: 12/28/2013 Discharge date: 12/30/2013  Time spent: 35 minutes  Recommendations for Outpatient Follow-up:  1. Please follow up on CBC/BMP in 2-3 days 2. Patient will require physical therapy at SNF 3. Please give 5 days of Ceftin 250 mg PO BID (anticipated stop date Sat 01/04/2014) 4. Please follow up on Urine Cultures  Discharge Diagnoses:  Principal Problem:   Sepsis Active Problems:   UTI (lower urinary tract infection)   Atrial fibrillation   Multiple falls   CKD (chronic kidney disease) stage 3, GFR 30-59 ml/min   Chronic systolic CHF (congestive heart failure)   Hypothermia   Discharge Condition: Stable  Diet recommendation: Regular Diet  Filed Weights   12/28/13 1100 12/29/13 1100 12/30/13 0530  Weight: 55.157 kg (121 lb 9.6 oz) 52.4 kg (115 lb 8.3 oz) 52.3 kg (115 lb 4.8 oz)    History of present illness:  [name] LenisHoward H [name] is a 80 y.o. male with a past medical history of dementia, currently resident of a skilled nursing facility, chronic atrial fibrillation, chronic systolic congestive heart failure, stage III chronic kidney disease, who presented as a transfer from a skilled nursing facility. Patient having a history of dementia and unable to provide history or participate in his own plan of care. History was obtained from his daughter present at bedside along with emergency room staff. Patient was in his usual state of health having an unwitnessed fall in his bedroom. He was found early this morning by nursing staff on the floor, unknown length of time that he was down for. His daughter believes that he likely fell on his way to use the restroom over night. On arrival he was felt to be hypothermic and temperature of 95.1. Imaging studies performed in the ER did not reveal evidence of acute fracture. Urinalysis did reveal urinary tract infection as he  was administered ceftriaxone.  At baseline he has advanced dementia, uses a walker to ambulate, is able to feed himself, but requires assistance with bathing and dressing.    Hospital Course:  Patient is a pleasant 80 year old, nursing home patient, admitted to the medicine service on 12/28/2013 presenting with sepsis, failure to thrive, found on the floor of his bedroom by nursing home staff. Initial labs revealed the presence of sepsis with source of infection likely to be UTI. He was started emperic IV antibiotic therapy with Rocephin.  1. Sepsis -Present on admission, evidenced by encephalopathy, hypothermia, with source of infection likely to be UTI.  -Showing clinical improvement today with resolution of his hypothermia.  -Urine Cultures growing Proteus  -Was discharged on Ceftin 250 mg PO BID x 5 days 2. UTI  -Likely precipitated fall and functional decline  -Pending urine and blood cultures, treated with IV Rocephin  -Was discharged on Ceftin 250 mg PO BID x 5 days 3. Failure to thrive/Functional Decline  -Likely precipitated by underlying infectious process  -He was ambulated to hallway this morning, Pt has been consulted.  -Early mobilization  4. Chronic Systolic CHF  -Compensated  -Will discharge on Lasix 20 mg PO q daily  5. Chronic Atrial Fibrillation  -Remains rate controlled  -Continue Metoprolol and digoxin  Consultations:  PT  Discharge Exam: Filed Vitals:   12/30/13 0530  BP: 149/75  Pulse: 67  Temp: 97.4 F (36.3 C)  Resp: 18    General: Appears better, ambulated to the hallway  Cardiovascular: Regular rate and rhythm, normal S1S2  Respiratory: Normal inspiratory effort, has a few crackles at bases  Abdomen: Soft, nontender nondistended  Musculoskeletal: no edema   Discharge Instructions You were cared for by a hospitalist during your hospital stay. If you have any questions about your discharge medications or the care you received while you were in  the hospital after you are discharged, you can call the unit and asked to speak with the hospitalist on call if the hospitalist that took care of you is not available. Once you are discharged, your primary care physician will handle any further medical issues. Please note that NO REFILLS for any discharge medications will be authorized once you are discharged, as it is imperative that you return to your primary care physician (or establish a relationship with a primary care physician if you do not have one) for your aftercare needs so that they can reassess your need for medications and monitor your lab values.  Discharge Instructions   Call MD for:  difficulty breathing, headache or visual disturbances    Complete by:  As directed      Call MD for:  extreme fatigue    Complete by:  As directed      Call MD for:  hives    Complete by:  As directed      Call MD for:  persistant dizziness or light-headedness    Complete by:  As directed      Call MD for:  persistant nausea and vomiting    Complete by:  As directed      Call MD for:  redness, tenderness, or signs of infection (pain, swelling, redness, odor or green/yellow discharge around incision site)    Complete by:  As directed      Call MD for:  severe uncontrolled pain    Complete by:  As directed      Call MD for:  temperature >100.4    Complete by:  As directed      Diet - low sodium heart healthy    Complete by:  As directed      Increase activity slowly    Complete by:  As directed           Current Discharge Medication List    START taking these medications   Details  cefUROXime (CEFTIN) 250 MG tablet Take 1 tablet (250 mg total) by mouth 2 (two) times daily with a meal. Qty: 10 tablet, Refills: 0    hydrocortisone cream 1 % Apply topically 2 (two) times daily. Apply to affected areas twice daily for 1 week Qty: 30 g, Refills: 0      CONTINUE these medications which have NOT CHANGED   Details  acetaminophen (TYLENOL)  500 MG tablet Take 1,000 mg by mouth every 6 (six) hours as needed. pain    Artificial Tear Ointment (REFRESH P.M. OP) Place 0.5 application into both eyes 4 (four) times daily.    atorvastatin (LIPITOR) 10 MG tablet Take 10 mg by mouth daily.    !! beta carotene w/minerals (OCUVITE) tablet Take 1 tablet by mouth daily.    brimonidine (ALPHAGAN) 0.2 % ophthalmic solution Place 1 drop into both eyes 2 (two) times daily. Wait 3-5 minutes between each drop    clopidogrel (PLAVIX) 75 MG tablet Take 75 mg by mouth daily.      dextromethorphan (DELSYM) 30 MG/5ML liquid Take 5 mLs by mouth 2 (two) times daily as needed for cough.  digoxin (LANOXIN) 0.25 MG tablet Take 250 mcg by mouth daily. *HOLD IF HEART RATE IS LESS THAN 60*    Emollient (EUCERIN) lotion Apply 1 mL topically at bedtime. Apply to feet at bedtime.    fludrocortisone (FLORINEF) 0.1 MG tablet Take 0.1 mg by mouth 2 (two) times daily.     furosemide (LASIX) 20 MG tablet Take 20 mg by mouth daily.    latanoprost (XALATAN) 0.005 % ophthalmic solution Place 1 drop into both eyes every evening. At 8 pm    metoprolol tartrate (LOPRESSOR) 25 MG tablet Take 25 mg by mouth 2 (two) times daily.    !! Multiple Vitamin (MULTIVITAMIN WITH MINERALS) TABS tablet Take 1 tablet by mouth daily.    neomycin-bacitracin-polymyxin (NEOSPORIN) [phone] ointment Apply 1 application topically as directed. 1 application daily to right forearm skin tears until healed    omeprazole (PRILOSEC) 20 MG capsule Take 20 mg by mouth 2 (two) times daily.     Polyethyl Glycol-Propyl Glycol (SYSTANE ULTRA) 0.4-0.3 % SOLN Place 1 drop into both eyes 4 (four) times daily.    potassium chloride (KLOR-CON) 20 MEQ packet Take 20 mEq by mouth daily. Mix with 4-6 ounces of water    sodium chloride (OCEAN) 0.65 % SOLN nasal spray Place 2 sprays into both nostrils 2 (two) times daily.    sodium chloride irrigation 0.9 % irrigation Irrigate with 1 application as  directed as directed. Clean right forearm skin tears daily until healed.    tobramycin-dexamethasone (TOBRADEX) ophthalmic ointment Place 0.5 application into the left eye 4 (four) times daily.    venlafaxine (EFFEXOR) 37.5 MG tablet Take 37.5 mg by mouth daily.     !! - Potential duplicate medications found. Please discuss with provider.    STOP taking these medications     nystatin-triamcinolone (MYCOLOG II) cream      potassium chloride SA (K-DUR,KLOR-CON) 20 MEQ tablet        Allergies  Allergen Reactions  . Sulfa Antibiotics     Unknown  . Sulfonamide Derivatives     REACTION: hives  . Amiodarone Hcl Rash   Follow-up Information   Follow up with Florentina Jenny, MD In 1 week.   Specialty:  Family Medicine   Contact information:   [address] [place] [address] [phone]        The results of significant diagnostics from this hospitalization (including imaging, microbiology, ancillary and laboratory) are listed below for reference.    Significant Diagnostic Studies: Dg Chest 1 View  12/28/2013   CLINICAL DATA:  Fall.  Hypoxia.  EXAM: CHEST - 1 VIEW  COMPARISON:  03/01/2012  FINDINGS: Cardiac enlargement with mild pulmonary vascular congestion. Hazy infiltrates in the mid and lower lung zones probably representing edema although pneumonia could also have this appearance. Small bilateral pleural effusions. No pneumothorax. Cardiac pacemaker is unchanged in position. Calcified and tortuous aorta.  IMPRESSION: Cardiac enlargement with mild pulmonary vascular congestion and bilateral edema and effusions.   Electronically Signed   By: Burman Nieves M.D.   On: 12/28/2013 07:02   Dg Hip Complete Right  12/28/2013   CLINICAL DATA:  Fall.  EXAM: RIGHT HIP - COMPLETE 2+ VIEW  COMPARISON:  None.  FINDINGS: Degenerative changes in the lower lumbar spine and hips. No evidence of acute fracture or dislocation. Pelvis appears intact.  IMPRESSION: No acute bony  abnormalities demonstrated in the right hip.   Electronically Signed   By: Marisa Cyphers.D.  On: 12/28/2013 06:56   Ct Head Wo Contrast  12/28/2013   CLINICAL DATA:  Unwitnessed fall. History of dementia. Severe headache. Laceration on the right arm.  EXAM: CT HEAD WITHOUT CONTRAST  CT CERVICAL SPINE WITHOUT CONTRAST  TECHNIQUE: Multidetector CT imaging of the head and cervical spine was performed following the standard protocol without intravenous contrast. Multiplanar CT image reconstructions of the cervical spine were also generated.  COMPARISON:  CT head 06/25/2013. CT head and cervical spine 09/11/2012.  FINDINGS: CT HEAD FINDINGS  Diffuse cerebral atrophy. Ventricular dilatation consistent with central atrophy. Low-attenuation changes in the deep white matter consistent with small vessel ischemia. Old of lacunar infarct in the periventricular white matter on the left. No mass effect or midline shift. No abnormal extra-axial fluid collections. Gray-white matter junctions are distinct. Basal cisterns are not effaced. No evidence of acute intracranial hemorrhage. No depressed skull fractures. Visualized paranasal sinuses and mastoid air cells are not opacified.  CT CERVICAL SPINE FINDINGS  Diffuse degenerative changes throughout the cervical spine with narrowed cervical interspaces and endplate hypertrophic changes throughout. Degenerative changes in the cervical facet joints. There is mild retrolisthesis of C3 on C4, mild anterolisthesis of C4 on C5, and mild retrolisthesis of C5 on C6. Alignment is unchanged since previous study and likely due to degenerative change. Normal alignment of the facet joints. No vertebral compression deformities. No prevertebral soft tissue swelling. C1-2 articulation appears intact. No focal bone lesion or bone destruction. Soft tissues are unremarkable. Pleural thickening, emphysematous changes common scarring in the lung apices.  IMPRESSION: No acute intracranial  abnormalities. Diffuse cerebral atrophy and small vessel ischemic changes.  Degenerative changes throughout the cervical spine. Minimal vertebral subluxations present, likely representing degenerative change. Ligamentous injury not entirely excluded but appearance is stable since 2014. No displaced fractures identified.   Electronically Signed   By: Burman Nieves M.D.   On: 12/28/2013 06:53   Ct Cervical Spine Wo Contrast  12/28/2013   CLINICAL DATA:  Unwitnessed fall. History of dementia. Severe headache. Laceration on the right arm.  EXAM: CT HEAD WITHOUT CONTRAST  CT CERVICAL SPINE WITHOUT CONTRAST  TECHNIQUE: Multidetector CT imaging of the head and cervical spine was performed following the standard protocol without intravenous contrast. Multiplanar CT image reconstructions of the cervical spine were also generated.  COMPARISON:  CT head 06/25/2013. CT head and cervical spine 09/11/2012.  FINDINGS: CT HEAD FINDINGS  Diffuse cerebral atrophy. Ventricular dilatation consistent with central atrophy. Low-attenuation changes in the deep white matter consistent with small vessel ischemia. Old of lacunar infarct in the periventricular white matter on the left. No mass effect or midline shift. No abnormal extra-axial fluid collections. Gray-white matter junctions are distinct. Basal cisterns are not effaced. No evidence of acute intracranial hemorrhage. No depressed skull fractures. Visualized paranasal sinuses and mastoid air cells are not opacified.  CT CERVICAL SPINE FINDINGS  Diffuse degenerative changes throughout the cervical spine with narrowed cervical interspaces and endplate hypertrophic changes throughout. Degenerative changes in the cervical facet joints. There is mild retrolisthesis of C3 on C4, mild anterolisthesis of C4 on C5, and mild retrolisthesis of C5 on C6. Alignment is unchanged since previous study and likely due to degenerative change. Normal alignment of the facet joints. No vertebral  compression deformities. No prevertebral soft tissue swelling. C1-2 articulation appears intact. No focal bone lesion or bone destruction. Soft tissues are unremarkable. Pleural thickening, emphysematous changes common scarring in the lung apices.  IMPRESSION: No acute intracranial abnormalities. Diffuse cerebral atrophy and  small vessel ischemic changes.  Degenerative changes throughout the cervical spine. Minimal vertebral subluxations present, likely representing degenerative change. Ligamentous injury not entirely excluded but appearance is stable since 2014. No displaced fractures identified.   Electronically Signed   By: Burman Nieves M.D.   On: 12/28/2013 06:53    Microbiology: Recent Results (from the past 240 hour(s))  URINE CULTURE     Status: None   Collection Time    12/28/13  8:06 AM      Result Value Ref Range Status   Specimen Description URINE, CATHETERIZED   Final   Special Requests NONE   Final   Culture  Setup Time     Final   Value: 12/28/2013 15:51     Performed at [facility] Count     Final   Value: >=100,000 COLONIES/ML     Performed at [facility]   Culture     Final   Value: PROTEUS MIRABILIS     Performed at [facility]   Report Status PENDING   Incomplete  MRSA PCR SCREENING     Status: None   Collection Time    12/28/13 11:41 AM      Result Value Ref Range Status   MRSA by PCR NEGATIVE  NEGATIVE Final   Comment:            The GeneXpert MRSA Assay (FDA     approved for NASAL specimens     only), is one component of a     comprehensive MRSA colonization     surveillance program. It is not     intended to diagnose MRSA     infection nor to guide or     monitor treatment for     MRSA infections.  CULTURE, BLOOD (ROUTINE X 2)     Status: None   Collection Time    12/28/13 11:45 AM      Result Value Ref Range Status   Specimen Description BLOOD RIGHT ARM   Final   Special Requests BOTTLES DRAWN AEROBIC ONLY 10 CC    Final   Culture  Setup Time     Final   Value: 12/28/2013 20:33     Performed at [facility]   Culture     Final   Value:        BLOOD CULTURE RECEIVED NO GROWTH TO DATE CULTURE WILL BE HELD FOR 5 DAYS BEFORE ISSUING A FINAL NEGATIVE REPORT     Performed at [facility]   Report Status PENDING   Incomplete  CULTURE, BLOOD (ROUTINE X 2)     Status: None   Collection Time    12/28/13 11:58 AM      Result Value Ref Range Status   Specimen Description BLOOD LEFT HAND   Final   Special Requests BOTTLES DRAWN AEROBIC ONLY 10 CC   Final   Culture  Setup Time     Final   Value: 12/28/2013 20:33     Performed at [facility]   Culture     Final   Value:        BLOOD CULTURE RECEIVED NO GROWTH TO DATE CULTURE WILL BE HELD FOR 5 DAYS BEFORE ISSUING A FINAL NEGATIVE REPORT     Performed at [facility]   Report Status PENDING   Incomplete     Labs: Basic Metabolic Panel:  Recent Labs Lab 12/28/13 0704 12/29/13 0535 12/30/13 0510  NA 142  141 140  K 4.0 3.8 3.9  CL 103 102 101  CO2 26 27 27   GLUCOSE 129* 126* 118*  BUN 28* 30* 33*  CREATININE 1.26 1.31 1.24  CALCIUM 8.8 9.0 8.8   Liver Function Tests:  Recent Labs Lab 12/28/13 0704  AST 35  ALT 28  ALKPHOS 110  BILITOT 1.1  PROT 7.2  ALBUMIN 3.2*   No results found for this basename: LIPASE, AMYLASE,  in the last 168 hours No results found for this basename: AMMONIA,  in the last 168 hours CBC:  Recent Labs Lab 12/28/13 0702 12/29/13 0535 12/30/13 0510  WBC 6.6 5.3 5.1  HGB 12.3* 12.9* 12.5*  HCT 37.2* 40.1 38.3*  MCV 88.2 89.7 88.7  PLT 192 195 181   Cardiac Enzymes:  Recent Labs Lab 12/28/13 0704  CKTOTAL 156  TROPONINI <0.30   BNP: BNP (last 3 results)  Recent Labs  12/28/13 0700  PROBNP 14620.0*   CBG:  Recent Labs Lab 12/29/13 0724 12/29/13 1200 12/29/13 1729 12/29/13 2133 12/30/13 0734  GLUCAP 107* 129* 95 144* 87       Signed:  Rhyann Berton,  Keanthony Poole  [facility] 12/30/2013, 10:00 AM

## 2014-01-03 LAB — CULTURE, BLOOD (ROUTINE X 2)
CULTURE: NO GROWTH
Culture: NO GROWTH

## 2014-01-08 NOTE — ED Provider Notes (Signed)
Medical screening examination/treatment/procedure(s) were conducted as a shared visit with non-physician practitioner(s) and myself.  I personally evaluated the patient during the encounter.   EKG Interpretation   Date/Time:  Saturday December 28 2013 07:02:02 EDT Ventricular Rate:  96 PR Interval:  133 QRS Duration: 94 QT Interval:  377 QTC Calculation: 476 R Axis:   -11 Text Interpretation:  Atrial fibrillation Left anterior fasicular block  Nonspecific T wave abnormality No significant change since last tracing  Confirmed by [name]  MD, [name] ([id number]) on 12/28/2013 7:38:29 AM      Patient presents with an unwitnessed fall from nursing home. History of dementia. Level V caveat. Visual multiple old bruises and appears to be a 4 cm skin tear to the right elbow. Also has a large acute appearing hematoma to the right hip. Patient is disoriented but moves all extremities. No obvious head injury. No posterior cervical tenderness. Admit to hospitalist per PA noted.  [name], MD 01/10/14 [id number]

## 2014-02-12 ENCOUNTER — Encounter (HOSPITAL_COMMUNITY): Payer: Self-pay | Admitting: *Deleted

## 2014-02-12 ENCOUNTER — Emergency Department (HOSPITAL_COMMUNITY)
Admission: EM | Admit: 2014-02-12 | Discharge: 2014-02-13 | Disposition: A | Discharge status: Home / Self Care | Payer: MEDICARE | Attending: Emergency Medicine | Admitting: Emergency Medicine

## 2014-02-12 DIAGNOSIS — Z9861 Coronary angioplasty status: Secondary | ICD-10-CM | POA: Insufficient documentation

## 2014-02-12 DIAGNOSIS — I4891 Unspecified atrial fibrillation: Secondary | ICD-10-CM | POA: Diagnosis not present

## 2014-02-12 DIAGNOSIS — Y939 Activity, unspecified: Secondary | ICD-10-CM | POA: Diagnosis not present

## 2014-02-12 DIAGNOSIS — Z87891 Personal history of nicotine dependence: Secondary | ICD-10-CM | POA: Insufficient documentation

## 2014-02-12 DIAGNOSIS — E782 Mixed hyperlipidemia: Secondary | ICD-10-CM | POA: Insufficient documentation

## 2014-02-12 DIAGNOSIS — Z7952 Long term (current) use of systemic steroids: Secondary | ICD-10-CM | POA: Insufficient documentation

## 2014-02-12 DIAGNOSIS — N182 Chronic kidney disease, stage 2 (mild): Secondary | ICD-10-CM | POA: Diagnosis not present

## 2014-02-12 DIAGNOSIS — F329 Major depressive disorder, single episode, unspecified: Secondary | ICD-10-CM | POA: Diagnosis not present

## 2014-02-12 DIAGNOSIS — Q101 Congenital ectropion: Secondary | ICD-10-CM | POA: Diagnosis not present

## 2014-02-12 DIAGNOSIS — I509 Heart failure, unspecified: Secondary | ICD-10-CM | POA: Diagnosis not present

## 2014-02-12 DIAGNOSIS — E119 Type 2 diabetes mellitus without complications: Secondary | ICD-10-CM | POA: Diagnosis not present

## 2014-02-12 DIAGNOSIS — Z7902 Long term (current) use of antithrombotics/antiplatelets: Secondary | ICD-10-CM | POA: Insufficient documentation

## 2014-02-12 DIAGNOSIS — F0390 Unspecified dementia without behavioral disturbance: Secondary | ICD-10-CM | POA: Insufficient documentation

## 2014-02-12 DIAGNOSIS — K219 Gastro-esophageal reflux disease without esophagitis: Secondary | ICD-10-CM | POA: Diagnosis not present

## 2014-02-12 DIAGNOSIS — S6991XA Unspecified injury of right wrist, hand and finger(s), initial encounter: Secondary | ICD-10-CM | POA: Diagnosis present

## 2014-02-12 DIAGNOSIS — S51811A Laceration without foreign body of right forearm, initial encounter: Secondary | ICD-10-CM | POA: Insufficient documentation

## 2014-02-12 DIAGNOSIS — Y998 Other external cause status: Secondary | ICD-10-CM | POA: Insufficient documentation

## 2014-02-12 DIAGNOSIS — Z79899 Other long term (current) drug therapy: Secondary | ICD-10-CM | POA: Insufficient documentation

## 2014-02-12 DIAGNOSIS — I2510 Atherosclerotic heart disease of native coronary artery without angina pectoris: Secondary | ICD-10-CM | POA: Diagnosis not present

## 2014-02-12 DIAGNOSIS — Y929 Unspecified place or not applicable: Secondary | ICD-10-CM | POA: Diagnosis not present

## 2014-02-12 DIAGNOSIS — I252 Old myocardial infarction: Secondary | ICD-10-CM | POA: Insufficient documentation

## 2014-02-12 DIAGNOSIS — W19XXXA Unspecified fall, initial encounter: Secondary | ICD-10-CM | POA: Diagnosis not present

## 2014-02-12 DIAGNOSIS — S51011A Laceration without foreign body of right elbow, initial encounter: Secondary | ICD-10-CM

## 2014-02-12 DIAGNOSIS — Z950 Presence of cardiac pacemaker: Secondary | ICD-10-CM | POA: Insufficient documentation

## 2014-02-12 MED ORDER — LIDOCAINE HCL 2 % IJ SOLN
INTRAMUSCULAR | Status: AC
  Administered 2014-02-12: 400 mg
  Filled 2014-02-12: qty 20

## 2014-02-12 NOTE — ED Notes (Signed)
Pt states he is unsure of what happen, pt denies any pain, pt in no distress, obvious skin tears to R arm, wrapped.

## 2014-02-12 NOTE — Discharge Instructions (Signed)
Laceration Care, Adult °A laceration is a cut or lesion that goes through all layers of the skin and into the tissue just beneath the skin. °TREATMENT  °Some lacerations may not require closure. Some lacerations may not be able to be closed due to an increased risk of infection. It is important to see your caregiver as soon as possible after an injury to minimize the risk of infection and maximize the opportunity for successful closure. °If closure is appropriate, pain medicines may be given, if needed. The wound will be cleaned to help prevent infection. Your caregiver will use stitches (sutures), staples, wound glue (adhesive), or skin adhesive strips to repair the laceration. These tools bring the skin edges together to allow for faster healing and a better cosmetic outcome. However, all wounds will heal with a scar. Once the wound has healed, scarring can be minimized by covering the wound with sunscreen during the day for 1 full year. °HOME CARE INSTRUCTIONS  °For sutures or staples: °· Keep the wound clean and dry. °· If you were given a bandage (dressing), you should change it at least once a day. Also, change the dressing if it becomes wet or dirty, or as directed by your caregiver. °· Wash the wound with soap and water 2 times a day. Rinse the wound off with water to remove all soap. Pat the wound dry with a clean towel. °· After cleaning, apply a thin layer of the antibiotic ointment as recommended by your caregiver. This will help prevent infection and keep the dressing from sticking. °· You may shower as usual after the first 24 hours. Do not soak the wound in water until the sutures are removed. °· Only take over-the-counter or prescription medicines for pain, discomfort, or fever as directed by your caregiver. °· Get your sutures or staples removed as directed by your caregiver. °For skin adhesive strips: °· Keep the wound clean and dry. °· Do not get the skin adhesive strips wet. You may bathe  carefully, using caution to keep the wound dry. °· If the wound gets wet, pat it dry with a clean towel. °· Skin adhesive strips will fall off on their own. You may trim the strips as the wound heals. Do not remove skin adhesive strips that are still stuck to the wound. They will fall off in time. °For wound adhesive: °· You may briefly wet your wound in the shower or bath. Do not soak or scrub the wound. Do not swim. Avoid periods of heavy perspiration until the skin adhesive has fallen off on its own. After showering or bathing, gently pat the wound dry with a clean towel. °· Do not apply liquid medicine, cream medicine, or ointment medicine to your wound while the skin adhesive is in place. This may loosen the film before your wound is healed. °· If a dressing is placed over the wound, be careful not to apply tape directly over the skin adhesive. This may cause the adhesive to be pulled off before the wound is healed. °· Avoid prolonged exposure to sunlight or tanning lamps while the skin adhesive is in place. Exposure to ultraviolet light in the first year will darken the scar. °· The skin adhesive will usually remain in place for 5 to 10 days, then naturally fall off the skin. Do not pick at the adhesive film. °You may need a tetanus shot if: °· You cannot remember when you had your last tetanus shot. °· You have never had a tetanus   shot. °If you get a tetanus shot, your arm may swell, get red, and feel warm to the touch. This is common and not a problem. If you need a tetanus shot and you choose not to have one, there is a rare chance of getting tetanus. Sickness from tetanus can be serious. °SEEK MEDICAL CARE IF:  °· You have redness, swelling, or increasing pain in the wound. °· You see a red line that goes away from the wound. °· You have yellowish-white fluid (pus) coming from the wound. °· You have a fever. °· You notice a bad smell coming from the wound or dressing. °· Your wound breaks open before or  after sutures have been removed. °· You notice something coming out of the wound such as wood or glass. °· Your wound is on your hand or foot and you cannot move a finger or toe. °SEEK IMMEDIATE MEDICAL CARE IF:  °· Your pain is not controlled with prescribed medicine. °· You have severe swelling around the wound causing pain and numbness or a change in color in your arm, hand, leg, or foot. °· Your wound splits open and starts bleeding. °· You have worsening numbness, weakness, or loss of function of any joint around or beyond the wound. °· You develop painful lumps near the wound or on the skin anywhere on your body. °MAKE SURE YOU:  °· Understand these instructions. °· Will watch your condition. °· Will get help right away if you are not doing well or get worse. °Document Released: 03/14/2005 Document Revised: 06/06/2011 Document Reviewed: 09/07/2010 °[employer]® Patient Information ©2015 [employer]. This information is not intended to replace advice given to you by your health care provider. Make sure you discuss any questions you have with your health care provider. °Tissue Adhesive Wound Care °Some cuts, wounds, lacerations, and incisions can be repaired by using tissue adhesive. Tissue adhesive is like glue. It holds the skin together, allowing for faster healing. It forms a strong bond on the skin in about 1 minute and reaches its full strength in about 2 or 3 minutes. The adhesive disappears naturally while the wound is healing. It is important to take proper care of your wound at home while it heals.  °HOME CARE INSTRUCTIONS  °· Showers are allowed. Do not soak the area containing the tissue adhesive. Do not take baths, swim, or use hot tubs. Do not use any soaps or ointments on the wound. Certain ointments can weaken the glue. °· If a bandage (dressing) has been applied, follow your health care provider's instructions for how often to change the dressing.   °· Keep the dressing dry if one has been  applied.   °· Do not scratch, pick, or rub the adhesive.   °· Do not place tape over the adhesive. The adhesive could come off when pulling the tape off.   °· Protect the wound from further injury until it is healed.   °· Protect the wound from sun and tanning bed exposure while it is healing and for several weeks after healing.   °· Only take over-the-counter or prescription medicines as directed by your health care provider.   °· Keep all follow-up appointments as directed by your health care provider. °SEEK IMMEDIATE MEDICAL CARE IF:  °· Your wound becomes red, swollen, hot, or tender.   °· You develop a rash after the glue is applied. °· You have increasing pain in the wound.   °· You have a red streak that goes away from the wound.   °· You have   pus coming from the wound.   °· You have increased bleeding. °· You have a fever. °· You have shaking chills.   °· You notice a bad smell coming from the wound.   °· Your wound or adhesive breaks open.   °MAKE SURE YOU:  °· Understand these instructions. °· Will watch your condition. °· Will get help right away if you are not doing well or get worse. °Document Released: 09/07/2000 Document Revised: 01/02/2013 Document Reviewed: 10/03/2012 °[employer]® Patient Information ©2015 [employer]. This information is not intended to replace advice given to you by your health care provider. Make sure you discuss any questions you have with your health care provider. ° °

## 2014-02-12 NOTE — ED Notes (Signed)
Pts R arm skin tears dressed w/ xeroform, gauze and kerlex

## 2014-02-12 NOTE — ED Notes (Signed)
Per EMS, [facility]. He walked into the dining room at facility with blood dripping from his right arm. He has dementia and does not recall if he fell or what caused the blood. Patient suffered 1 skin tear to the right wrist and 2 skin tear to the right forearm. EMS reported they bandaged the areas up. Also, patient does spit, not intently at anyone but will need a bag to spit in at times.

## 2014-02-12 NOTE — ED Notes (Signed)
Bed: [id number] Expected date: 02/12/14 Expected time: 8:06 PM Means of arrival: Ambulance Comments: 80 yo M  Fall

## 2014-02-12 NOTE — ED Notes (Signed)
[facility] called to transport pt back to facility, daughter aware.

## 2014-02-12 NOTE — ED Provider Notes (Signed)
CSN: [id number]     Arrival date & time 02/12/14  2018 History   First MD Initiated Contact with Patient 02/12/14 2154     Chief Complaint  Patient presents with  . Fall     (Consider location/radiation/quality/duration/timing/severity/associated sxs/prior Treatment) HPI  This is an 80 year old male with dementia who is sent to the emergency department for evaluation of skin tears to the right forearm. Skin tears unwitnessed today. Up to date on tetanus. Level 5 caveat due to dementia.  Past Medical History  Diagnosis Date  . Cardiac pacemaker in situ     s/p Viatron DDD Pacemaker for sinus node dysfunction  . Orthostatic hypotension   . Hyperlipidemia     mixed  . Atrial fibrillation     not felt to be a coumadin candidate due to falls  . Sinoatrial node dysfunction     s/p PPM (MDT)  . Coronary atherosclerosis of native coronary artery     s/p AWMI 2005 stent LAD  . Diabetes mellitus, type 2   . Chronic renal insufficiency, stage II (mild)   . Intolerance, drug     amniodarone  . Upper GI hemorrhage     2006 Mallory Weiss Tear  . MI (myocardial infarction)     coronary artery status post anterior wall myocardial infarction in 2005 treated with a stent to the left anterior descending.  . Dizziness     recurrent dizziness and falls  . Diabetes mellitus   . Hyperlipidemia   . Renal insufficiency     mild  . Dementia   . Depression   . CHF (congestive heart failure)   . GERD (gastroesophageal reflux disease)    Past Surgical History  Procedure Laterality Date  . Pacemaker placement    . Coronary stent placement     Family History  Problem Relation Age of Onset  . Coronary artery disease Brother   . Diabetes Mother    History  Substance Use Topics  . Smoking status: Former Smoker    Quit date: 03/28/1960  . Smokeless tobacco: Not on file  . Alcohol Use: No    Review of Systems  Unable to perform ROS     Allergies  Sulfa antibiotics; Sulfonamide  derivatives; and Amiodarone hcl  Home Medications   Prior to Admission medications   Medication Sig Start Date End Date Taking? Authorizing Provider  acetaminophen (TYLENOL) 500 MG tablet Take 1,000 mg by mouth every 6 (six) hours as needed. pain   Yes Historical Provider, MD  Artificial Tear Ointment (REFRESH P.M. OP) Place 0.5 application into both eyes 4 (four) times daily.   Yes Historical Provider, MD  atorvastatin (LIPITOR) 10 MG tablet Take 10 mg by mouth daily.   Yes Historical Provider, MD  beta carotene w/minerals (OCUVITE) tablet Take 1 tablet by mouth daily.   Yes Historical Provider, MD  brimonidine (ALPHAGAN) 0.2 % ophthalmic solution Place 1 drop into both eyes 2 (two) times daily. Wait 3-5 minutes between each drop   Yes Historical Provider, MD  clopidogrel (PLAVIX) 75 MG tablet Take 75 mg by mouth daily.     Yes Historical Provider, MD  Cranberry 475 MG CAPS Take 1 capsule by mouth daily.   Yes Historical Provider, MD  dextromethorphan (DELSYM) 30 MG/5ML liquid Take 5 mLs by mouth 2 (two) times daily as needed for cough.    Yes Historical Provider, MD  digoxin (LANOXIN) 0.25 MG tablet Take 250 mcg by mouth daily. *HOLD IF HEART RATE IS LESS  THAN 60*   Yes Historical Provider, MD  Emollient (EUCERIN) lotion Apply 1 mL topically at bedtime. Apply to feet at bedtime.   Yes Historical Provider, MD  fludrocortisone (FLORINEF) 0.1 MG tablet Take 0.1 mg by mouth 2 (two) times daily.    Yes Historical Provider, MD  furosemide (LASIX) 20 MG tablet Take 20 mg by mouth daily.   Yes Historical Provider, MD  latanoprost (XALATAN) 0.005 % ophthalmic solution Place 1 drop into both eyes every evening. At 8 pm   Yes Historical Provider, MD  metoprolol tartrate (LOPRESSOR) 25 MG tablet Take 25 mg by mouth 2 (two) times daily.   Yes Historical Provider, MD  Multiple Vitamin (MULTIVITAMIN WITH MINERALS) TABS tablet Take 1 tablet by mouth daily.   Yes Historical Provider, MD  nystatin-triamcinolone  (MYCOLOG II) cream Apply 1 application topically 2 (two) times daily. To inner thighs   Yes Historical Provider, MD  omeprazole (PRILOSEC) 20 MG capsule Take 20 mg by mouth 2 (two) times daily.    Yes Historical Provider, MD  Polyethyl Glycol-Propyl Glycol (SYSTANE ULTRA) 0.4-0.3 % SOLN Place 1 drop into both eyes 4 (four) times daily.   Yes Historical Provider, MD  potassium chloride (KLOR-CON) 20 MEQ packet Take 20 mEq by mouth daily. Mix with 4-6 ounces of water   Yes Historical Provider, MD  sodium chloride (OCEAN) 0.65 % SOLN nasal spray Place 2 sprays into both nostrils 2 (two) times daily.   Yes Historical Provider, MD  sodium chloride irrigation 0.9 % irrigation Irrigate with 1 application as directed as directed. Clean right forearm skin tears daily until healed.   Yes Historical Provider, MD  venlafaxine (EFFEXOR) 37.5 MG tablet Take 37.5 mg by mouth daily.   Yes Historical Provider, MD  cefUROXime (CEFTIN) 250 MG tablet Take 1 tablet (250 mg total) by mouth 2 (two) times daily with a meal. 12/30/13   Jeralyn Bennett, MD  hydrocortisone cream 1 % Apply topically 2 (two) times daily. Apply to affected areas twice daily for 1 week 12/30/13   Jeralyn Bennett, MD  neomycin-bacitracin-polymyxin (NEOSPORIN) [phone] ointment Apply 1 application topically as directed. 1 application daily to right forearm skin tears until healed    Historical Provider, MD  tobramycin-dexamethasone (TOBRADEX) ophthalmic ointment Place 0.5 application into the left eye 4 (four) times daily.    Historical Provider, MD   BP 147/82 mmHg  Pulse 67  Temp(Src) 98.5 F (36.9 C) (Oral)  Resp 13  SpO2 86% Physical Exam  Constitutional: He appears well-developed and well-nourished. No distress.  HENT:  Head: Normocephalic and atraumatic.  Eyes: Conjunctivae are normal. No scleral icterus.  Left ectropion   Neck: Normal range of motion. Neck supple.  Cardiovascular: Normal rate, regular rhythm and normal heart sounds.    Pulmonary/Chest: Effort normal and breath sounds normal. No respiratory distress.  Abdominal: Soft. There is no tenderness.  Musculoskeletal: He exhibits no edema.       Arms: 3 large skin tear of the forearm measuring about 7 cm each  Neurological: He is alert.  Skin: Skin is warm and dry. He is not diaphoretic.  Psychiatric: His behavior is normal.  Nursing note and vitals reviewed.   ED Course  Procedures (including critical care time) Labs Review Labs Reviewed - No data to display  Imaging Review No results found.   EKG Interpretation None       LACERATION REPAIR Performed by: Arthor Captain Authorized by: Arthor Captain Consent: Verbal consent obtained. Risks and benefits: risks,  benefits and alternatives were discussed Consent given by: patient Patient identity confirmed: provided demographic data Prepped and Draped in normal sterile fashion Wound explored  Laceration Location: R forearm  Laceration Length: 7 cm  No Foreign Bodies seen or palpated  Anesthesia: local infiltration  Local anesthetic: lidocaine 2% w/o epinephrine  Anesthetic total: 3 ml  Irrigation method: syringe Amount of cleaning: standard  Skin closure: steri strips and 5-0 prolene  Number of sutures: 4  Technique: si  Patient tolerance: Patient tolerated the procedure well with no immediate complications. LACERATION REPAIR Performed by: Arthor CaptainHarris, Lorraine Terriquez Authorized by: Arthor CaptainHarris, Donesha Wallander Consent: Verbal consent obtained. Risks and benefits: risks, benefits and alternatives were discussed Consent given by: patient Patient identity confirmed: provided demographic data Prepped and Draped in normal sterile fashion Wound explored  Laceration Location: forearm  Laceration Length: 4cm  No Foreign Bodies seen or palpated  Anesthesia: local infiltration  Local anesthetic: lidocaine 2 % w/o epinephrine  Anesthetic total: 3 ml  Irrigation method: syringe Amount of cleaning:  standard  Skin closure: steristrip, 5-0 prolene  Number of sutures: 7  Technique: RI  Patient tolerance: Patient tolerated the procedure well with no immediate complications.  LACERATION REPAIR Performed by: Arthor CaptainHarris, Jaythen Hamme Authorized by: Arthor CaptainHarris, Duha Abair Consent: Verbal consent obtained. Risks and benefits: risks, benefits and alternatives were discussed Consent given by: patient Patient identity confirmed: provided demographic data Prepped and Draped in normal sterile fashion Wound explored  Laceration Location: forearm  Laceration Length: 8 cm  No Foreign Bodies seen or palpated  Anesthesia: local infiltration  Local anesthetic: lidocaine 2% w/o epinephrine  Anesthetic total: 4 ml  Irrigation method: syringe Amount of cleaning: standard  Skin closure: steri strip, 5-0 prolene  Number of sutures: 7  Technique: si  Patient tolerance: Patient tolerated the procedure well with no immediate complications.   MDM   Final diagnoses:  None   11:23 PM BP 147/82 mmHg  Pulse 67  Temp(Src) 98.5 F (36.9 C) (Oral)  Resp 13  SpO2 86% Patient's lskin tears repaired here in the ED.  Bandage applied. Initial documented 02 sats in high 80s, feel that this was equipment or placement error. No signs of difficulty breathing extremities are warm with food cap refill. Discharge patient , remoe sutures in 7 dayds.   Arthor CaptainAbigail Jessilyn Catino, PA-C 02/13/14 0010  Ward GivensIva L Knapp, MD 02/13/14 [id number]  Ward GivensIva L Knapp, MD 02/13/14 [phone]

## 2014-03-03 ENCOUNTER — Emergency Department (HOSPITAL_COMMUNITY)
Admission: EM | Admit: 2014-03-03 | Discharge: 2014-03-03 | Disposition: A | Discharge status: Home / Self Care | Payer: MEDICARE | Attending: Emergency Medicine | Admitting: Emergency Medicine

## 2014-03-03 ENCOUNTER — Encounter (HOSPITAL_COMMUNITY): Payer: Self-pay | Admitting: Emergency Medicine

## 2014-03-03 DIAGNOSIS — K219 Gastro-esophageal reflux disease without esophagitis: Secondary | ICD-10-CM | POA: Insufficient documentation

## 2014-03-03 DIAGNOSIS — E119 Type 2 diabetes mellitus without complications: Secondary | ICD-10-CM | POA: Insufficient documentation

## 2014-03-03 DIAGNOSIS — D649 Anemia, unspecified: Secondary | ICD-10-CM

## 2014-03-03 DIAGNOSIS — Z950 Presence of cardiac pacemaker: Secondary | ICD-10-CM | POA: Diagnosis not present

## 2014-03-03 DIAGNOSIS — Y9301 Activity, walking, marching and hiking: Secondary | ICD-10-CM | POA: Insufficient documentation

## 2014-03-03 DIAGNOSIS — I252 Old myocardial infarction: Secondary | ICD-10-CM | POA: Insufficient documentation

## 2014-03-03 DIAGNOSIS — Y998 Other external cause status: Secondary | ICD-10-CM | POA: Diagnosis not present

## 2014-03-03 DIAGNOSIS — S51011A Laceration without foreign body of right elbow, initial encounter: Secondary | ICD-10-CM

## 2014-03-03 DIAGNOSIS — I951 Orthostatic hypotension: Secondary | ICD-10-CM | POA: Insufficient documentation

## 2014-03-03 DIAGNOSIS — S59901A Unspecified injury of right elbow, initial encounter: Secondary | ICD-10-CM | POA: Diagnosis present

## 2014-03-03 DIAGNOSIS — I2510 Atherosclerotic heart disease of native coronary artery without angina pectoris: Secondary | ICD-10-CM | POA: Diagnosis not present

## 2014-03-03 DIAGNOSIS — Z792 Long term (current) use of antibiotics: Secondary | ICD-10-CM | POA: Diagnosis not present

## 2014-03-03 DIAGNOSIS — Z79899 Other long term (current) drug therapy: Secondary | ICD-10-CM | POA: Diagnosis not present

## 2014-03-03 DIAGNOSIS — I4891 Unspecified atrial fibrillation: Secondary | ICD-10-CM | POA: Diagnosis not present

## 2014-03-03 DIAGNOSIS — F0390 Unspecified dementia without behavioral disturbance: Secondary | ICD-10-CM | POA: Insufficient documentation

## 2014-03-03 DIAGNOSIS — Y92129 Unspecified place in nursing home as the place of occurrence of the external cause: Secondary | ICD-10-CM | POA: Insufficient documentation

## 2014-03-03 DIAGNOSIS — Z7902 Long term (current) use of antithrombotics/antiplatelets: Secondary | ICD-10-CM | POA: Insufficient documentation

## 2014-03-03 DIAGNOSIS — W1839XA Other fall on same level, initial encounter: Secondary | ICD-10-CM | POA: Diagnosis not present

## 2014-03-03 DIAGNOSIS — N182 Chronic kidney disease, stage 2 (mild): Secondary | ICD-10-CM | POA: Diagnosis not present

## 2014-03-03 DIAGNOSIS — E785 Hyperlipidemia, unspecified: Secondary | ICD-10-CM | POA: Insufficient documentation

## 2014-03-03 DIAGNOSIS — I509 Heart failure, unspecified: Secondary | ICD-10-CM | POA: Diagnosis not present

## 2014-03-03 DIAGNOSIS — Z7952 Long term (current) use of systemic steroids: Secondary | ICD-10-CM | POA: Insufficient documentation

## 2014-03-03 DIAGNOSIS — S51001A Unspecified open wound of right elbow, initial encounter: Secondary | ICD-10-CM | POA: Insufficient documentation

## 2014-03-03 DIAGNOSIS — F329 Major depressive disorder, single episode, unspecified: Secondary | ICD-10-CM | POA: Diagnosis not present

## 2014-03-03 LAB — BASIC METABOLIC PANEL
Anion gap: 13 (ref 5–15)
BUN: 41 mg/dL — ABNORMAL HIGH (ref 6–23)
CHLORIDE: 105 meq/L (ref 96–112)
CO2: 23 meq/L (ref 19–32)
CREATININE: 1.27 mg/dL (ref 0.50–1.35)
Calcium: 9.2 mg/dL (ref 8.4–10.5)
GFR calc Af Amer: 56 mL/min — ABNORMAL LOW (ref >90)
GFR calc non Af Amer: 48 mL/min — ABNORMAL LOW (ref >90)
Glucose, Bld: 101 mg/dL — ABNORMAL HIGH (ref 70–99)
Potassium: 4.4 mEq/L (ref 3.7–5.3)
Sodium: 141 mEq/L (ref 137–147)

## 2014-03-03 LAB — URINALYSIS, ROUTINE W REFLEX MICROSCOPIC
BILIRUBIN URINE: NEGATIVE
Glucose, UA: NEGATIVE mg/dL
Hgb urine dipstick: NEGATIVE
Ketones, ur: NEGATIVE mg/dL
Leukocytes, UA: NEGATIVE
NITRITE: NEGATIVE
PH: 5.5 (ref 5.0–8.0)
Protein, ur: 30 mg/dL — AB
SPECIFIC GRAVITY, URINE: 1.017 (ref 1.005–1.030)
Urobilinogen, UA: 0.2 mg/dL (ref 0.0–1.0)

## 2014-03-03 LAB — CBC WITH DIFFERENTIAL/PLATELET
BASOS ABS: 0 10*3/uL (ref 0.0–0.1)
Basophils Relative: 0 % (ref 0–1)
Eosinophils Absolute: 0.1 10*3/uL (ref 0.0–0.7)
Eosinophils Relative: 4 % (ref 0–5)
HEMATOCRIT: 34.3 % — AB (ref 39.0–52.0)
HEMOGLOBIN: 10.8 g/dL — AB (ref 13.0–17.0)
LYMPHS PCT: 20 % (ref 12–46)
Lymphs Abs: 0.8 10*3/uL (ref 0.7–4.0)
MCH: 28.4 pg (ref 26.0–34.0)
MCHC: 31.5 g/dL (ref 30.0–36.0)
MCV: 90.3 fL (ref 78.0–100.0)
MONOS PCT: 14 % — AB (ref 3–12)
Monocytes Absolute: 0.5 10*3/uL (ref 0.1–1.0)
Neutro Abs: 2.5 10*3/uL (ref 1.7–7.7)
Neutrophils Relative %: 62 % (ref 43–77)
Platelets: 180 10*3/uL (ref 150–400)
RBC: 3.8 MIL/uL — AB (ref 4.22–5.81)
RDW: 15.5 % (ref 11.5–15.5)
WBC: 4 10*3/uL (ref 4.0–10.5)

## 2014-03-03 LAB — URINE MICROSCOPIC-ADD ON

## 2014-03-03 LAB — POC OCCULT BLOOD, ED: Fecal Occult Bld: NEGATIVE

## 2014-03-03 MED ORDER — SODIUM CHLORIDE 0.9 % IV BOLUS (SEPSIS)
500.0000 mL | Freq: Once | INTRAVENOUS | Status: AC
  Administered 2014-03-03: 500 mL via INTRAVENOUS

## 2014-03-03 MED ORDER — BACITRACIN ZINC 500 UNIT/GM EX OINT
1.0000 "application " | TOPICAL_OINTMENT | Freq: Once | CUTANEOUS | Status: AC
  Administered 2014-03-03: 1 via TOPICAL
  Filled 2014-03-03: qty 0.9

## 2014-03-03 NOTE — ED Notes (Signed)
Pt's contact:  Marylene BuergerSandra Presnell (daughter)---- tel. # [phone].

## 2014-03-03 NOTE — ED Notes (Signed)
Brought in by EMS from [facility] with c/o fall with skin injury.  Pt has had an unwitnessed fall--- pt was ambulatory with walker in the hall, was observed lying on the laminated floor--- skin tear to right elbow was noted.  Pt presents to ED alert and awake-- oriented to self only (pt has hx of dementia).  Pt denies any pain, but c/o pain with PROM to right knee.

## 2014-03-03 NOTE — Discharge Instructions (Signed)

## 2014-03-03 NOTE — ED Notes (Signed)
[facility] staff was called and wad was notified of pt's discharge--- discharge instructions provided.

## 2014-03-03 NOTE — ED Provider Notes (Signed)
CSN: [id number]     Arrival date & time 03/03/14  1735 History   First MD Initiated Contact with Patient 03/03/14 1736     Chief Complaint  Patient presents with  . Fall  . Skin Injury    HPI The patient presents to the emergency room for evaluation after a fall. According to EMS the patient had an unwitnessed fall. He was found next to his walker in the hallway lying on the laminated floor. Staff at his senior living facility noticed an abrasion on his right elbow as well as a skin tear. Patient denies any complaints. He was sent to the emergency room for evaluation. The daughter states that she was given different information. She was told that staff witnessed the patient's stumble and fall. He tripped over his shoes while using his walker. The patient has dementia. He does not recall the fall. He denies having any complaints. He denies chest pain, abdominal pain, pain in his extremities or headache. He has not had any trouble with nausea or vomiting. The daughter states he appears to be in his usual state of health. The patient does have a history of frequent falling. Past Medical History  Diagnosis Date  . Cardiac pacemaker in situ     s/p Viatron DDD Pacemaker for sinus node dysfunction  . Orthostatic hypotension   . Hyperlipidemia     mixed  . Atrial fibrillation     not felt to be a coumadin candidate due to falls  . Sinoatrial node dysfunction     s/p PPM (MDT)  . Coronary atherosclerosis of native coronary artery     s/p AWMI 2005 stent LAD  . Diabetes mellitus, type 2   . Chronic renal insufficiency, stage II (mild)   . Intolerance, drug     amniodarone  . Upper GI hemorrhage     2006 Mallory Weiss Tear  . MI (myocardial infarction)     coronary artery status post anterior wall myocardial infarction in 2005 treated with a stent to the left anterior descending.  . Dizziness     recurrent dizziness and falls  . Diabetes mellitus   . Hyperlipidemia   . Renal insufficiency      mild  . Dementia   . Depression   . CHF (congestive heart failure)   . GERD (gastroesophageal reflux disease)    Past Surgical History  Procedure Laterality Date  . Pacemaker placement    . Coronary stent placement     Family History  Problem Relation Age of Onset  . Coronary artery disease Brother   . Diabetes Mother    History  Substance Use Topics  . Smoking status: Former Smoker    Quit date: 03/28/1960  . Smokeless tobacco: Not on file  . Alcohol Use: No    Review of Systems  All other systems reviewed and are negative.     Allergies  Sulfa antibiotics; Sulfonamide derivatives; and Amiodarone hcl  Home Medications   Prior to Admission medications   Medication Sig Start Date End Date Taking? Authorizing Provider  acetaminophen (TYLENOL) 500 MG tablet Take 1,000 mg by mouth every 6 (six) hours as needed. pain   Yes Historical Provider, MD  atorvastatin (LIPITOR) 10 MG tablet Take 10 mg by mouth every morning.    Yes Historical Provider, MD  beta carotene w/minerals (OCUVITE) tablet Take 1 tablet by mouth every morning.    Yes Historical Provider, MD  brimonidine (ALPHAGAN) 0.2 % ophthalmic solution Place 1 drop into  both eyes 2 (two) times daily. Wait 3-5 minutes between each drop   Yes Historical Provider, MD  clopidogrel (PLAVIX) 75 MG tablet Take 75 mg by mouth every morning.    Yes Historical Provider, MD  Cranberry 475 MG CAPS Take 1 capsule by mouth at bedtime.    Yes Historical Provider, MD  dextromethorphan (DELSYM) 30 MG/5ML liquid Take 5 mLs by mouth 2 (two) times daily as needed for cough.    Yes Historical Provider, MD  digoxin (LANOXIN) 0.25 MG tablet Take 250 mcg by mouth every morning. *HOLD IF HEART RATE IS LESS THAN 60*   Yes Historical Provider, MD  fludrocortisone (FLORINEF) 0.1 MG tablet Take 0.1 mg by mouth 2 (two) times daily.    Yes Historical Provider, MD  furosemide (LASIX) 20 MG tablet Take 20 mg by mouth every morning.    Yes Historical  Provider, MD  latanoprost (XALATAN) 0.005 % ophthalmic solution Place 1 drop into both eyes every evening. At 8 pm   Yes Historical Provider, MD  metoprolol tartrate (LOPRESSOR) 25 MG tablet Take 25 mg by mouth 2 (two) times daily.   Yes Historical Provider, MD  Multiple Vitamins-Minerals (THEREMS M PO) Take 1 tablet by mouth every morning.   Yes Historical Provider, MD  neomycin-bacitracin-polymyxin (NEOSPORIN) [phone] ointment Apply 1 application topically as directed. 1 application daily to right forearm skin tears until healed   Yes Historical Provider, MD  nystatin-triamcinolone (MYCOLOG II) cream Apply 1 application topically 2 (two) times daily. To inner thighs   Yes Historical Provider, MD  omeprazole (PRILOSEC) 20 MG capsule Take 20 mg by mouth 2 (two) times daily.    Yes Historical Provider, MD  Polyethyl Glycol-Propyl Glycol (SYSTANE ULTRA) 0.4-0.3 % SOLN Place 1 drop into both eyes 4 (four) times daily.   Yes Historical Provider, MD  potassium chloride (KLOR-CON) 20 MEQ packet Take 20 mEq by mouth every morning. Mix with 4-6 ounces of water   Yes Historical Provider, MD  PRESCRIPTION MEDICATION Apply 1 application topically at bedtime. Triamcinolone cream 0.1% Aquaphor cream 1:1 Apply to feet until clear   Yes Historical Provider, MD  sodium chloride (OCEAN) 0.65 % SOLN nasal spray Place 2 sprays into both nostrils 2 (two) times daily.   Yes Historical Provider, MD  sodium chloride irrigation 0.9 % irrigation Irrigate with 1 application as directed as directed. Clean right forearm skin tears daily until healed.   Yes Historical Provider, MD  venlafaxine (EFFEXOR) 37.5 MG tablet Take 37.5 mg by mouth every morning.    Yes Historical Provider, MD  cefUROXime (CEFTIN) 250 MG tablet Take 1 tablet (250 mg total) by mouth 2 (two) times daily with a meal. Patient not taking: Reported on 03/03/2014 12/30/13   Jeralyn Bennett, MD  hydrocortisone cream 1 % Apply topically 2 (two) times daily.  Apply to affected areas twice daily for 1 week Patient not taking: Reported on 03/03/2014 12/30/13   Jeralyn Bennett, MD   BP 149/72 mmHg  Pulse 82  Temp(Src) 98 F (36.7 C) (Oral)  Resp 18  SpO2 98% Physical Exam  Constitutional: He appears well-developed and well-nourished. No distress.  Elderly, frail  HENT:  Head: Normocephalic and atraumatic.  Right Ear: External ear normal.  Left Ear: External ear normal.  Eyes: Conjunctivae are normal. Right eye exhibits no discharge. Left eye exhibits no discharge. No scleral icterus.  Neck: Neck supple. No tracheal deviation present.  Cardiovascular: Normal rate, regular rhythm and intact distal pulses.   Pulmonary/Chest: Effort  normal and breath sounds normal. No stridor. No respiratory distress. He has no wheezes. He has no rales.  Abdominal: Soft. Bowel sounds are normal. He exhibits no distension. There is no tenderness. There is no rebound and no guarding.  Musculoskeletal: He exhibits no edema or tenderness.  Spinal kyphosis, no tenderness to palpation along the cervical thoracic or lumbar spine, full range of motion in all 4 extremities without pain or discomfort, small skin tear abrasion in the right elbow region, no active bleeding  Neurological: He is alert. He has normal strength. He is disoriented. No cranial nerve deficit (no facial droop, extraocular movements intact, no slurred speech) or sensory deficit. He exhibits normal muscle tone. He displays no seizure activity. Coordination normal. GCS eye subscore is 4. GCS verbal subscore is 4. GCS motor subscore is 6.  The patient is able to move all extremities, normal sensation, he answers questions, no facial droop  Skin: Skin is warm and dry. No rash noted.  Psychiatric: He has a normal mood and affect.  Nursing note and vitals reviewed.   ED Course  Procedures (including critical care time) Labs Review Labs Reviewed  CBC WITH DIFFERENTIAL - Abnormal; Notable for the following:     RBC 3.80 (*)    Hemoglobin 10.8 (*)    HCT 34.3 (*)    Monocytes Relative 14 (*)    All other components within normal limits  BASIC METABOLIC PANEL - Abnormal; Notable for the following:    Glucose, Bld 101 (*)    BUN 41 (*)    GFR calc non Af Amer 48 (*)    GFR calc Af Amer 56 (*)    All other components within normal limits  URINALYSIS, ROUTINE W REFLEX MICROSCOPIC - Abnormal; Notable for the following:    Protein, ur 30 (*)    All other components within normal limits  URINE MICROSCOPIC-ADD ON - Abnormal; Notable for the following:    Casts HYALINE CASTS (*)    All other components within normal limits  POC OCCULT BLOOD, ED     EKG Interpretation   Date/Time:  Monday March 03 2014 18:14:39 EST Ventricular Rate:  85 PR Interval:    QRS Duration: 100 QT Interval:  379 QTC Calculation: 451 R Axis:   -3 Text Interpretation:  Afib/flut and V-paced complexes No further rhythm  analysis attempted due to paced rhythm Probable anterior infarct, age  indeterminate No significant change since last tracing Confirmed by Providencia Hottenstein   MD-J, Autumn Pruitt ([id number]) on 03/03/2014 6:21:01 PM      MDM   Final diagnoses:  Skin tear of elbow without complication, right, initial encounter  Anemia, unspecified anemia type     The patient's lab tests show a decrease in his hemoglobin to 10.8. He does have an elevated BUN  however his stool was guaiac negative. At this time he does not appear to have any acute GI bleed and no confirmed evidence of occult gastrointestinal bleeding.  Patient was given a small bolus of fluid for possible prerenal azotemia. His daughter did not want CT scan imaging of the head. She understands the possibility of missing intracranial bleeding however she also understands what the treatment for that might entail andthat she would not want that done for her father. I think this is very reasonable especially considering the patient does not have evidence of head injury on exam  at this time. Local wound care was provided to the patient's small skin tears and abrasions.  At this  time there does not appear to be any evidence of an acute emergency medical condition and the patient appears stable for discharge with appropriate outpatient follow up.    Linwood Dibbles, MD 03/03/14 [phone]

## 2014-03-03 NOTE — ED Notes (Signed)
Bed: [id number] Expected date:  Expected time:  Means of arrival:  Comments: EMS- 80yo M, slip and fall

## 2014-03-03 NOTE — ED Notes (Signed)
Pt's daughter was notified of pt's discharge.

## 2014-03-04 LAB — POC OCCULT BLOOD, ED: Fecal Occult Bld: NEGATIVE

## 2014-04-12 ENCOUNTER — Emergency Department (HOSPITAL_COMMUNITY): Payer: Medicare Other

## 2014-04-12 ENCOUNTER — Emergency Department (HOSPITAL_COMMUNITY)
Admission: EM | Admit: 2014-04-12 | Discharge: 2014-04-14 | Disposition: A | Discharge status: Home / Self Care | Payer: Medicare Other | Attending: Emergency Medicine | Admitting: Emergency Medicine

## 2014-04-12 ENCOUNTER — Encounter (HOSPITAL_COMMUNITY): Payer: Self-pay | Admitting: Emergency Medicine

## 2014-04-12 DIAGNOSIS — I252 Old myocardial infarction: Secondary | ICD-10-CM | POA: Insufficient documentation

## 2014-04-12 DIAGNOSIS — Z950 Presence of cardiac pacemaker: Secondary | ICD-10-CM | POA: Diagnosis not present

## 2014-04-12 DIAGNOSIS — I2510 Atherosclerotic heart disease of native coronary artery without angina pectoris: Secondary | ICD-10-CM | POA: Diagnosis not present

## 2014-04-12 DIAGNOSIS — E119 Type 2 diabetes mellitus without complications: Secondary | ICD-10-CM | POA: Insufficient documentation

## 2014-04-12 DIAGNOSIS — Y92128 Other place in nursing home as the place of occurrence of the external cause: Secondary | ICD-10-CM | POA: Diagnosis not present

## 2014-04-12 DIAGNOSIS — S2232XA Fracture of one rib, left side, initial encounter for closed fracture: Secondary | ICD-10-CM | POA: Diagnosis not present

## 2014-04-12 DIAGNOSIS — Z79899 Other long term (current) drug therapy: Secondary | ICD-10-CM | POA: Insufficient documentation

## 2014-04-12 DIAGNOSIS — I509 Heart failure, unspecified: Secondary | ICD-10-CM | POA: Insufficient documentation

## 2014-04-12 DIAGNOSIS — F329 Major depressive disorder, single episode, unspecified: Secondary | ICD-10-CM | POA: Diagnosis not present

## 2014-04-12 DIAGNOSIS — Z87891 Personal history of nicotine dependence: Secondary | ICD-10-CM | POA: Diagnosis not present

## 2014-04-12 DIAGNOSIS — Z7952 Long term (current) use of systemic steroids: Secondary | ICD-10-CM | POA: Insufficient documentation

## 2014-04-12 DIAGNOSIS — Y998 Other external cause status: Secondary | ICD-10-CM | POA: Diagnosis not present

## 2014-04-12 DIAGNOSIS — E785 Hyperlipidemia, unspecified: Secondary | ICD-10-CM | POA: Diagnosis not present

## 2014-04-12 DIAGNOSIS — I4891 Unspecified atrial fibrillation: Secondary | ICD-10-CM | POA: Diagnosis not present

## 2014-04-12 DIAGNOSIS — W1839XA Other fall on same level, initial encounter: Secondary | ICD-10-CM | POA: Insufficient documentation

## 2014-04-12 DIAGNOSIS — Z792 Long term (current) use of antibiotics: Secondary | ICD-10-CM | POA: Diagnosis not present

## 2014-04-12 DIAGNOSIS — K219 Gastro-esophageal reflux disease without esophagitis: Secondary | ICD-10-CM | POA: Insufficient documentation

## 2014-04-12 DIAGNOSIS — Z23 Encounter for immunization: Secondary | ICD-10-CM | POA: Insufficient documentation

## 2014-04-12 DIAGNOSIS — Z9861 Coronary angioplasty status: Secondary | ICD-10-CM | POA: Insufficient documentation

## 2014-04-12 DIAGNOSIS — Z7902 Long term (current) use of antithrombotics/antiplatelets: Secondary | ICD-10-CM | POA: Insufficient documentation

## 2014-04-12 DIAGNOSIS — N183 Chronic kidney disease, stage 3 (moderate): Secondary | ICD-10-CM | POA: Diagnosis not present

## 2014-04-12 DIAGNOSIS — S20312A Abrasion of left front wall of thorax, initial encounter: Secondary | ICD-10-CM | POA: Diagnosis present

## 2014-04-12 DIAGNOSIS — F0390 Unspecified dementia without behavioral disturbance: Secondary | ICD-10-CM | POA: Insufficient documentation

## 2014-04-12 DIAGNOSIS — R52 Pain, unspecified: Secondary | ICD-10-CM

## 2014-04-12 DIAGNOSIS — Y9301 Activity, walking, marching and hiking: Secondary | ICD-10-CM | POA: Diagnosis not present

## 2014-04-12 LAB — I-STAT CHEM 8, ED
BUN: 45 mg/dL — AB (ref 6–23)
CHLORIDE: 106 meq/L (ref 96–112)
CREATININE: 1.4 mg/dL — AB (ref 0.50–1.35)
Calcium, Ion: 1.16 mmol/L (ref 1.13–1.30)
Glucose, Bld: 104 mg/dL — ABNORMAL HIGH (ref 70–99)
HCT: 36 % — ABNORMAL LOW (ref 39.0–52.0)
HEMOGLOBIN: 12.2 g/dL — AB (ref 13.0–17.0)
POTASSIUM: 4.7 mmol/L (ref 3.5–5.1)
Sodium: 143 mmol/L (ref 135–145)
TCO2: 24 mmol/L (ref 0–100)

## 2014-04-12 MED ORDER — TETANUS-DIPHTH-ACELL PERTUSSIS 5-2.5-18.5 LF-MCG/0.5 IM SUSP
0.5000 mL | Freq: Once | INTRAMUSCULAR | Status: AC
  Administered 2014-04-12: 0.5 mL via INTRAMUSCULAR
  Filled 2014-04-12: qty 0.5

## 2014-04-12 MED ORDER — ACETAMINOPHEN 325 MG PO TABS
650.0000 mg | ORAL_TABLET | Freq: Once | ORAL | Status: AC
  Administered 2014-04-12: 650 mg via ORAL
  Filled 2014-04-12: qty 2

## 2014-04-12 NOTE — ED Provider Notes (Addendum)
CSN: [id number]     Arrival date & time 04/12/14  1502 History   First MD Initiated Contact with Patient 04/12/14 1528     Chief Complaint  Patient presents with  . Fall     (Consider location/radiation/quality/duration/timing/severity/associated sxs/prior Treatment) HPI Level V caveat dementia history is obtained from [facility], med tech at dementia unit where patient resides. Patient was walking with his walker at 2:15 PM when he turned around, lost his balance and fell. He did not lose consciousness. He complains of no pain since the event. He was acting as his normal self before the fall today. Brought by EMS Past Medical History  Diagnosis Date  . Cardiac pacemaker in situ     s/p Viatron DDD Pacemaker for sinus node dysfunction  . Orthostatic hypotension   . Hyperlipidemia     mixed  . Atrial fibrillation     not felt to be a coumadin candidate due to falls  . Sinoatrial node dysfunction     s/p PPM (MDT)  . Coronary atherosclerosis of native coronary artery     s/p AWMI 2005 stent LAD  . Diabetes mellitus, type 2   . Chronic renal insufficiency, stage II (mild)   . Intolerance, drug     amniodarone  . Upper GI hemorrhage     2006 Mallory Weiss Tear  . MI (myocardial infarction)     coronary artery status post anterior wall myocardial infarction in 2005 treated with a stent to the left anterior descending.  . Dizziness     recurrent dizziness and falls  . Diabetes mellitus   . Hyperlipidemia   . Renal insufficiency     mild  . Dementia   . Depression   . CHF (congestive heart failure)   . GERD (gastroesophageal reflux disease)    Past Surgical History  Procedure Laterality Date  . Pacemaker placement    . Coronary stent placement     Family History  Problem Relation Age of Onset  . Coronary artery disease Brother   . Diabetes Mother    History  Substance Use Topics  . Smoking status: Former Smoker    Quit date: 03/28/1960  . Smokeless tobacco:  Not on file  . Alcohol Use: No   DO NOT RESUSCITATE CODE STATUS  Review of Systems  Unable to perform ROS Musculoskeletal: Positive for gait problem.       Walks with walker   unable to perform review of systems secondary to dementia.  DO NOT RESUSCITATE CODE STATUS  Allergies  Sulfa antibiotics; Sulfonamide derivatives; and Amiodarone hcl  Home Medications   Prior to Admission medications   Medication Sig Start Date End Date Taking? Authorizing Provider  acetaminophen (TYLENOL) 500 MG tablet Take 1,000 mg by mouth every 6 (six) hours as needed. pain    Historical Provider, MD  atorvastatin (LIPITOR) 10 MG tablet Take 10 mg by mouth every morning.     Historical Provider, MD  beta carotene w/minerals (OCUVITE) tablet Take 1 tablet by mouth every morning.     Historical Provider, MD  brimonidine (ALPHAGAN) 0.2 % ophthalmic solution Place 1 drop into both eyes 2 (two) times daily. Wait 3-5 minutes between each drop    Historical Provider, MD  cefUROXime (CEFTIN) 250 MG tablet Take 1 tablet (250 mg total) by mouth 2 (two) times daily with a meal. Patient not taking: Reported on 03/03/2014 12/30/13   Jeralyn Bennett, MD  clopidogrel (PLAVIX) 75 MG tablet Take 75 mg by mouth  every morning.     Historical Provider, MD  Cranberry 475 MG CAPS Take 1 capsule by mouth at bedtime.     Historical Provider, MD  dextromethorphan (DELSYM) 30 MG/5ML liquid Take 5 mLs by mouth 2 (two) times daily as needed for cough.     Historical Provider, MD  digoxin (LANOXIN) 0.25 MG tablet Take 250 mcg by mouth every morning. *HOLD IF HEART RATE IS LESS THAN 60*    Historical Provider, MD  fludrocortisone (FLORINEF) 0.1 MG tablet Take 0.1 mg by mouth 2 (two) times daily.     Historical Provider, MD  furosemide (LASIX) 20 MG tablet Take 20 mg by mouth every morning.     Historical Provider, MD  hydrocortisone cream 1 % Apply topically 2 (two) times daily. Apply to affected areas twice daily for 1 week Patient not  taking: Reported on 03/03/2014 12/30/13   Jeralyn Bennett, MD  latanoprost (XALATAN) 0.005 % ophthalmic solution Place 1 drop into both eyes every evening. At 8 pm    Historical Provider, MD  metoprolol tartrate (LOPRESSOR) 25 MG tablet Take 25 mg by mouth 2 (two) times daily.    Historical Provider, MD  Multiple Vitamins-Minerals (THEREMS M PO) Take 1 tablet by mouth every morning.    Historical Provider, MD  neomycin-bacitracin-polymyxin (NEOSPORIN) [phone] ointment Apply 1 application topically as directed. 1 application daily to right forearm skin tears until healed    Historical Provider, MD  nystatin-triamcinolone (MYCOLOG II) cream Apply 1 application topically 2 (two) times daily. To inner thighs    Historical Provider, MD  omeprazole (PRILOSEC) 20 MG capsule Take 20 mg by mouth 2 (two) times daily.     Historical Provider, MD  Polyethyl Glycol-Propyl Glycol (SYSTANE ULTRA) 0.4-0.3 % SOLN Place 1 drop into both eyes 4 (four) times daily.    Historical Provider, MD  potassium chloride (KLOR-CON) 20 MEQ packet Take 20 mEq by mouth every morning. Mix with 4-6 ounces of water    Historical Provider, MD  PRESCRIPTION MEDICATION Apply 1 application topically at bedtime. Triamcinolone cream 0.1% Aquaphor cream 1:1 Apply to feet until clear    Historical Provider, MD  sodium chloride (OCEAN) 0.65 % SOLN nasal spray Place 2 sprays into both nostrils 2 (two) times daily.    Historical Provider, MD  sodium chloride irrigation 0.9 % irrigation Irrigate with 1 application as directed as directed. Clean right forearm skin tears daily until healed.    Historical Provider, MD  venlafaxine (EFFEXOR) 37.5 MG tablet Take 37.5 mg by mouth every morning.     Historical Provider, MD   There were no vitals taken for this visit. Physical Exam  Constitutional:  Chronically ill-appearing, frail  HENT:  Head: Normocephalic and atraumatic.  Eyes: Conjunctivae are normal. Pupils are equal, round, and reactive to  light.  Neck: Neck supple. No tracheal deviation present. No thyromegaly present.  Cardiovascular: Normal rate.   No murmur heard. Irregularly irregular pulse counted at 88 bpm by me  Pulmonary/Chest: Effort normal and breath sounds normal.  Abrasions over left lateral rib cage with corresponding tenderness. No crepitance or flail  Abdominal: Soft. Bowel sounds are normal. He exhibits no distension. There is no tenderness.  Musculoskeletal: Normal range of motion. He exhibits no edema or tenderness.  Entire spine nontender. Pelvis stable nontender. All 4 extremities nontender, neurovascularly intact  Neurological: He is alert. No cranial nerve deficit. Coordination normal.  Follow simple commands moves all extremities motor strength 5 over 5 overall   Skin:  Skin is warm and dry. No rash noted.  Bilateral upper extremities with old-appearing ecchymoses  Psychiatric: He has a normal mood and affect.  Nursing note and vitals reviewed.   ED Course  Procedures (including critical care time) Labs Review Labs Reviewed - No data to display  Imaging Review No results found.   EKG Interpretation None     Patient was uncomfortable when attempting to get up to walk with walker. Tylenol ordered. x-ray viewed by me Results for orders placed or performed during the hospital encounter of 04/12/14  I-stat chem 8, ed  Result Value Ref Range   Sodium 143 135 - 145 mmol/L   Potassium 4.7 3.5 - 5.1 mmol/L   Chloride 106 96 - 112 mEq/L   BUN 45 (H) 6 - 23 mg/dL   Creatinine, Ser 1.611.40 (H) 0.50 - 1.35 mg/dL   Glucose, Bld 096104 (H) 70 - 99 mg/dL   Calcium, Ion 0.451.16 4.091.13 - 1.30 mmol/L   TCO2 24 0 - 100 mmol/L   Hemoglobin 12.2 (L) 13.0 - 17.0 g/dL   HCT 81.136.0 (L) 91.439.0 - 78.252.0 %   Dg Ribs Unilateral W/chest Left  04/12/2014   CLINICAL DATA:  [place] today; [place] ambulating with walker hitting his left flank. Abrasion to that area. Pt cannot point where he is  hurting on his left ribs, saying hurting and screaming when pt was moved;  EXAM: LEFT RIBS AND CHEST - 3+ VIEW  COMPARISON:  Chest dated 12/28/2013.  FINDINGS: Stable enlarged cardiac silhouette and left subclavian pacemaker leads. Small left pleural effusion without significant change. No definite right pleural fluid seen at this time. Minimal residual patchy opacity at the left lung base. Displaced left posterior eleventh rib fracture. No pneumothorax. Coronary artery stent.  IMPRESSION: 1. Displaced left posterior eleventh rib fracture without pneumothorax. 2. Small left pleural effusion or pneumothorax. 3. Stable cardiomegaly. 4. Mild residual scarring or atelectasis at the left lung base.   Electronically Signed   By: Gordan PaymentSteve  Reid M.D.   On: 04/12/2014 16:31    MDM  Plan Tylenol for pain. T dap administered. Renal insufficiency is chronic. I discussed planned with patient's daughter Ms. Presnell via phone. She is in agreement Diagnosis #1 fall #2 left rib fracture #3renal insuficiency Final diagnoses:  None        Doug SouSam Nicloe Frontera, MD 04/12/14 1839  Doug SouSam Keilin Gamboa, MD 04/12/14 [phone]

## 2014-04-12 NOTE — ED Notes (Signed)
[place] while ambulating with walker hitting his left flank.  Abrasion to that area.  Staff reports he hit his head but EMS cannot find any bruising or lacerations. 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  EMS tried to give him a mask but he took that off, also he would not accept a container to spit in.  Hi is c/o back pain but pointing to left flank area.

## 2014-04-12 NOTE — ED Notes (Signed)
Patient transported to X-ray 

## 2014-04-12 NOTE — ED Notes (Signed)
Tried to ambulate pt with a walker, but was unsuccessful. Pt stated that his back hurt too much to stand and then grunted in pain.

## 2014-04-12 NOTE — ED Notes (Signed)
Bed: WA11 Expected date:  Expected time:  Means of arrival:  Comments: fall 

## 2014-04-12 NOTE — Discharge Instructions (Signed)
Mr. Durene CalHunter may have Tylenol 650 mg every 4 hours as needed for pain. If pain is not well controlled with Tylenol, call his primary care physician.Rib Fracture A rib fracture is a break or crack in one of the bones of the ribs. The ribs are like a cage that goes around your upper chest. A broken or cracked rib is often painful, but most do not cause other problems. Most rib fractures heal on their own in 1-3 months. HOME CARE  Avoid activities that cause pain to the injured area. Protect your injured area.  Slowly increase activity as told by your doctor.  Take medicine as told by your doctor.  Put ice on the injured area for the first 1-2 days after you have been treated or as told by your doctor.  Put ice in a plastic bag.  Place a towel between your skin and the bag.  Leave the ice on for 15-20 minutes at a time, every 2 hours while you are awake.  Do deep breathing as told by your doctor. You may be told to:  Take deep breaths many times a day.  Cough many times a day while hugging a pillow.  Use a device (incentive spirometer) to perform deep breathing many times a day.  Drink enough fluids to keep your pee (urine) clear or pale yellow.   Do not wear a rib belt or binder. These do not allow you to breathe deeply. GET HELP RIGHT AWAY IF:   You have a fever.  You have trouble breathing.   You cannot stop coughing.  You cough up thick or bloody spit (mucus).   You feel sick to your stomach (nauseous), throw up (vomit), or have belly (abdominal) pain.   Your pain gets worse and medicine does not help.  MAKE SURE YOU:   Understand these instructions.  Will watch your condition.  Will get help right away if you are not doing well or get worse. Document Released: 12/22/2007 Document Revised: 07/09/2012 Document Reviewed: 05/16/2012 [facility] Patient Information 2015 [place]. This information is not intended to replace advice given to you by your health care  provider. Make sure you discuss any questions you have with your health care provider.

## 2014-05-01 ENCOUNTER — Emergency Department (HOSPITAL_COMMUNITY): Payer: Medicare Other

## 2014-05-01 ENCOUNTER — Encounter (HOSPITAL_COMMUNITY): Payer: Self-pay | Admitting: Emergency Medicine

## 2014-05-01 ENCOUNTER — Emergency Department (HOSPITAL_COMMUNITY)
Admission: EM | Admit: 2014-05-01 | Discharge: 2014-05-01 | Disposition: A | Discharge status: Home / Self Care | Payer: Medicare Other | Attending: Emergency Medicine | Admitting: Emergency Medicine

## 2014-05-01 DIAGNOSIS — Z79899 Other long term (current) drug therapy: Secondary | ICD-10-CM | POA: Diagnosis not present

## 2014-05-01 DIAGNOSIS — Z87891 Personal history of nicotine dependence: Secondary | ICD-10-CM | POA: Insufficient documentation

## 2014-05-01 DIAGNOSIS — N182 Chronic kidney disease, stage 2 (mild): Secondary | ICD-10-CM | POA: Insufficient documentation

## 2014-05-01 DIAGNOSIS — N390 Urinary tract infection, site not specified: Secondary | ICD-10-CM | POA: Insufficient documentation

## 2014-05-01 DIAGNOSIS — I2510 Atherosclerotic heart disease of native coronary artery without angina pectoris: Secondary | ICD-10-CM | POA: Insufficient documentation

## 2014-05-01 DIAGNOSIS — F0390 Unspecified dementia without behavioral disturbance: Secondary | ICD-10-CM | POA: Insufficient documentation

## 2014-05-01 DIAGNOSIS — Z7902 Long term (current) use of antithrombotics/antiplatelets: Secondary | ICD-10-CM | POA: Insufficient documentation

## 2014-05-01 DIAGNOSIS — F329 Major depressive disorder, single episode, unspecified: Secondary | ICD-10-CM | POA: Insufficient documentation

## 2014-05-01 DIAGNOSIS — Z950 Presence of cardiac pacemaker: Secondary | ICD-10-CM | POA: Insufficient documentation

## 2014-05-01 DIAGNOSIS — E782 Mixed hyperlipidemia: Secondary | ICD-10-CM | POA: Insufficient documentation

## 2014-05-01 DIAGNOSIS — W19XXXA Unspecified fall, initial encounter: Secondary | ICD-10-CM

## 2014-05-01 DIAGNOSIS — E119 Type 2 diabetes mellitus without complications: Secondary | ICD-10-CM | POA: Insufficient documentation

## 2014-05-01 DIAGNOSIS — I509 Heart failure, unspecified: Secondary | ICD-10-CM | POA: Insufficient documentation

## 2014-05-01 DIAGNOSIS — I252 Old myocardial infarction: Secondary | ICD-10-CM | POA: Insufficient documentation

## 2014-05-01 DIAGNOSIS — K219 Gastro-esophageal reflux disease without esophagitis: Secondary | ICD-10-CM | POA: Diagnosis not present

## 2014-05-01 DIAGNOSIS — Z043 Encounter for examination and observation following other accident: Secondary | ICD-10-CM | POA: Insufficient documentation

## 2014-05-01 LAB — BASIC METABOLIC PANEL
ANION GAP: 9 (ref 5–15)
BUN: 41 mg/dL — ABNORMAL HIGH (ref 6–23)
CALCIUM: 8.8 mg/dL (ref 8.4–10.5)
CO2: 26 mmol/L (ref 19–32)
CREATININE: 1.24 mg/dL (ref 0.50–1.35)
Chloride: 109 mmol/L (ref 96–112)
GFR calc Af Amer: 58 mL/min — ABNORMAL LOW (ref >90)
GFR calc non Af Amer: 50 mL/min — ABNORMAL LOW (ref >90)
Glucose, Bld: 121 mg/dL — ABNORMAL HIGH (ref 70–99)
Potassium: 5 mmol/L (ref 3.5–5.1)
Sodium: 144 mmol/L (ref 135–145)

## 2014-05-01 LAB — URINE MICROSCOPIC-ADD ON

## 2014-05-01 LAB — URINALYSIS, ROUTINE W REFLEX MICROSCOPIC
BILIRUBIN URINE: NEGATIVE
Glucose, UA: NEGATIVE mg/dL
Hgb urine dipstick: NEGATIVE
Ketones, ur: NEGATIVE mg/dL
Nitrite: NEGATIVE
Protein, ur: NEGATIVE mg/dL
Specific Gravity, Urine: 1.013 (ref 1.005–1.030)
Urobilinogen, UA: 0.2 mg/dL (ref 0.0–1.0)
pH: 6 (ref 5.0–8.0)

## 2014-05-01 LAB — CBC WITH DIFFERENTIAL/PLATELET
BASOS PCT: 0 % (ref 0–1)
Basophils Absolute: 0 10*3/uL (ref 0.0–0.1)
EOS ABS: 0 10*3/uL (ref 0.0–0.7)
Eosinophils Relative: 0 % (ref 0–5)
HCT: 35.3 % — ABNORMAL LOW (ref 39.0–52.0)
HEMOGLOBIN: 11.1 g/dL — AB (ref 13.0–17.0)
Lymphocytes Relative: 4 % — ABNORMAL LOW (ref 12–46)
Lymphs Abs: 0.4 10*3/uL — ABNORMAL LOW (ref 0.7–4.0)
MCH: 29.4 pg (ref 26.0–34.0)
MCHC: 31.4 g/dL (ref 30.0–36.0)
MCV: 93.6 fL (ref 78.0–100.0)
MONOS PCT: 9 % (ref 3–12)
Monocytes Absolute: 0.8 10*3/uL (ref 0.1–1.0)
NEUTROS PCT: 87 % — AB (ref 43–77)
Neutro Abs: 7.5 10*3/uL (ref 1.7–7.7)
Platelets: 190 10*3/uL (ref 150–400)
RBC: 3.77 MIL/uL — AB (ref 4.22–5.81)
RDW: 16.8 % — ABNORMAL HIGH (ref 11.5–15.5)
WBC: 8.7 10*3/uL (ref 4.0–10.5)

## 2014-05-01 MED ORDER — CEPHALEXIN 500 MG PO CAPS: 500.0000 mg | ORAL_CAPSULE | Freq: Two times a day (BID) | ORAL | Status: DC

## 2014-05-01 MED ORDER — CEFTRIAXONE SODIUM 1 G IJ SOLR
1.0000 g | Freq: Once | INTRAMUSCULAR | Status: AC
  Administered 2014-05-01: 1 g via INTRAVENOUS
  Filled 2014-05-01: qty 10

## 2014-05-01 NOTE — Progress Notes (Signed)
CSW met with patient at bedside. There was no family present. Patient confirms that he fell today. Per note, patient was found next to wheelchair.Patient states that he is able to complete his ADL's independently.    Willette Brace [phone] ED CSW 05/01/2014 9:10 PM

## 2014-05-01 NOTE — ED Notes (Signed)
[facility] called for transport.  

## 2014-05-01 NOTE — Discharge Instructions (Signed)
[name] [name] did not have any evidence of new injuries from his fall in the Emergency Department.  He does have a urinary tract infection and was given a dose of rocephin.  He will be started on keflex as an outpatient.  Urine cultures have been sent.  Please get him rechecked if he develops any new or concerning symptoms.     Urinary Tract Infection Urinary tract infections (UTIs) can develop anywhere along your urinary tract. Your urinary tract is your body's drainage system for removing wastes and extra water. Your urinary tract includes two kidneys, two ureters, a bladder, and a urethra. Your kidneys are a pair of bean-shaped organs. Each kidney is about the size of your fist. They are located below your ribs, one on each side of your spine. CAUSES Infections are caused by microbes, which are microscopic organisms, including fungi, viruses, and bacteria. These organisms are so small that they can only be seen through a microscope. Bacteria are the microbes that most commonly cause UTIs. SYMPTOMS  Symptoms of UTIs may vary by age and gender of the patient and by the location of the infection. Symptoms in young women typically include a frequent and intense urge to urinate and a painful, burning feeling in the bladder or urethra during urination. Older women and men are more likely to be tired, shaky, and weak and have muscle aches and abdominal pain. A fever may mean the infection is in your kidneys. Other symptoms of a kidney infection include pain in your back or sides below the ribs, nausea, and vomiting. DIAGNOSIS To diagnose a UTI, your caregiver will ask you about your symptoms. Your caregiver also will ask to provide a urine sample. The urine sample will be tested for bacteria and white blood cells. White blood cells are made by your body to help fight infection. TREATMENT  Typically, UTIs can be treated with medication. Because most UTIs are caused by a bacterial infection, they usually can be  treated with the use of antibiotics. The choice of antibiotic and length of treatment depend on your symptoms and the type of bacteria causing your infection. HOME CARE INSTRUCTIONS  If you were prescribed antibiotics, take them exactly as your caregiver instructs you. Finish the medication even if you feel better after you have only taken some of the medication.  Drink enough water and fluids to keep your urine clear or pale yellow.  Avoid caffeine, tea, and carbonated beverages. They tend to irritate your bladder.  Empty your bladder often. Avoid holding urine for long periods of time.  Empty your bladder before and after sexual intercourse.  After a bowel movement, women should cleanse from front to back. Use each tissue only once. SEEK MEDICAL CARE IF:   You have back pain.  You develop a fever.  Your symptoms do not begin to resolve within 3 days. SEEK IMMEDIATE MEDICAL CARE IF:   You have severe back pain or lower abdominal pain.  You develop chills.  You have nausea or vomiting.  You have continued burning or discomfort with urination. MAKE SURE YOU:   Understand these instructions.  Will watch your condition.  Will get help right away if you are not doing well or get worse. Document Released: 12/22/2004 Document Revised: 09/13/2011 Document Reviewed: 04/22/2011 [facility] Patient Information 2015 [employer] [place]. This information is not intended to replace advice given to you by your health care provider. Make sure you discuss any questions you have with your health care provider.

## 2014-05-01 NOTE — ED Notes (Signed)
Pt found on floor next to wheelchair. Unwitnessed how he got on floor. Pt was complaining of bilateral lower leg pain. Denies pain at this time.

## 2014-05-01 NOTE — ED Notes (Signed)
Bed: WA08 Expected date:  Expected time:  Means of arrival:  Comments: EMS- 81yo M, fall, bilateral knee pain

## 2014-05-01 NOTE — ED Provider Notes (Signed)
CSN: [id number]     Arrival date & time 05/01/14  1508 History   First MD Initiated Contact with Patient 05/01/14 1537     Chief Complaint  Patient presents with  . Fall     Patient is a 81 y.o. male presenting with fall. The history is provided by the EMS personnel and the nursing home. No language interpreter was used.  Fall   Pt here for evaluation of injuries following a fall.  Level V caveat due to dementia.  Per NH and EMS report he was found sitting on the floor following an unwitnessed fall.  He complained of pain that the time, but could not localize it.  He uses a walker with very unsteady gait at baseline.  No recent illnesses.    Past Medical History  Diagnosis Date  . Cardiac pacemaker in situ     s/p Viatron DDD Pacemaker for sinus node dysfunction  . Orthostatic hypotension   . Hyperlipidemia     mixed  . Atrial fibrillation     not felt to be a coumadin candidate due to falls  . Sinoatrial node dysfunction     s/p PPM (MDT)  . Coronary atherosclerosis of native coronary artery     s/p AWMI 2005 stent LAD  . Diabetes mellitus, type 2   . Chronic renal insufficiency, stage II (mild)   . Intolerance, drug     amniodarone  . Upper GI hemorrhage     2006 Mallory Weiss Tear  . MI (myocardial infarction)     coronary artery status post anterior wall myocardial infarction in 2005 treated with a stent to the left anterior descending.  . Dizziness     recurrent dizziness and falls  . Diabetes mellitus   . Hyperlipidemia   . Renal insufficiency     mild  . Dementia   . Depression   . CHF (congestive heart failure)   . GERD (gastroesophageal reflux disease)    Past Surgical History  Procedure Laterality Date  . Pacemaker placement    . Coronary stent placement     Family History  Problem Relation Age of Onset  . Coronary artery disease Brother   . Diabetes Mother    History  Substance Use Topics  . Smoking status: Former Smoker    Quit date: 03/28/1960   . Smokeless tobacco: Not on file  . Alcohol Use: No    Review of Systems  Unable to perform ROS     Allergies  Sulfa antibiotics; Sulfonamide derivatives; and Amiodarone hcl  Home Medications   Prior to Admission medications   Medication Sig Start Date End Date Taking? Authorizing Provider  acetaminophen (TYLENOL) 500 MG tablet Take 1,000 mg by mouth every 6 (six) hours as needed. pain    Historical Provider, MD  atorvastatin (LIPITOR) 10 MG tablet Take 10 mg by mouth every morning.     Historical Provider, MD  beta carotene w/minerals (OCUVITE) tablet Take 1 tablet by mouth every morning.     Historical Provider, MD  brimonidine (ALPHAGAN) 0.2 % ophthalmic solution Place 1 drop into both eyes 2 (two) times daily. Wait 3-5 minutes between each drop    Historical Provider, MD  cefUROXime (CEFTIN) 250 MG tablet Take 1 tablet (250 mg total) by mouth 2 (two) times daily with a meal. Patient not taking: Reported on 03/03/2014 12/30/13   Jeralyn Bennett, MD  clopidogrel (PLAVIX) 75 MG tablet Take 75 mg by mouth every morning.     Historical  Provider, MD  Cranberry 500 MG CAPS Take 500 mg by mouth 2 (two) times daily.    Historical Provider, MD  dextromethorphan (DELSYM) 30 MG/5ML liquid Take 5 mLs by mouth 2 (two) times daily as needed for cough.     Historical Provider, MD  digoxin (LANOXIN) 0.25 MG tablet Take 250 mcg by mouth every morning. *HOLD IF HEART RATE IS LESS THAN 60*    Historical Provider, MD  fludrocortisone (FLORINEF) 0.1 MG tablet Take 0.1 mg by mouth 2 (two) times daily.     Historical Provider, MD  furosemide (LASIX) 20 MG tablet Take 20 mg by mouth every morning.     Historical Provider, MD  hydrocortisone cream 1 % Apply topically 2 (two) times daily. Apply to affected areas twice daily for 1 week Patient not taking: Reported on 03/03/2014 12/30/13   Jeralyn BennettEzequiel Zamora, MD  latanoprost (XALATAN) 0.005 % ophthalmic solution Place 1 drop into both eyes every evening. At 8 pm     Historical Provider, MD  metoprolol tartrate (LOPRESSOR) 25 MG tablet Take 25 mg by mouth 2 (two) times daily.    Historical Provider, MD  Multiple Vitamins-Minerals (THEREMS M PO) Take 1 tablet by mouth every morning.    Historical Provider, MD  neomycin-bacitracin-polymyxin (NEOSPORIN) [phone] ointment Apply 1 application topically as directed. 1 application daily to right forearm skin tears until healed    Historical Provider, MD  nystatin-triamcinolone (MYCOLOG II) cream Apply 1 application topically 2 (two) times daily. To inner thighs    Historical Provider, MD  Polyethyl Glycol-Propyl Glycol (SYSTANE ULTRA) 0.4-0.3 % SOLN Place 1 drop into both eyes 4 (four) times daily.    Historical Provider, MD  potassium chloride (KLOR-CON) 20 MEQ packet Take 20 mEq by mouth every morning. Mix with 4-6 ounces of water    Historical Provider, MD  PRESCRIPTION MEDICATION Apply 1 application topically at bedtime. Triamcinolone cream 0.1% Aquaphor cream 1:1 Apply to feet until clear    Historical Provider, MD  ranitidine (ZANTAC) 150 MG tablet Take 150 mg by mouth at bedtime.    Historical Provider, MD  sodium chloride (OCEAN) 0.65 % SOLN nasal spray Place 2 sprays into both nostrils 2 (two) times daily.    Historical Provider, MD  sodium chloride irrigation 0.9 % irrigation Irrigate with 1 application as directed as directed. Clean right forearm skin tears daily until healed.    Historical Provider, MD  venlafaxine (EFFEXOR) 37.5 MG tablet Take 37.5 mg by mouth every morning.     Historical Provider, MD   BP 102/86 mmHg  Pulse 80  Temp(Src) 99.1 F (37.3 C) (Oral)  Resp 22  SpO2 96% Physical Exam  Constitutional: He appears well-developed and well-nourished.  HENT:  Head: Normocephalic and atraumatic.  Eyes: Pupils are equal, round, and reactive to light.  Neck:  No C-spine tenderness  Cardiovascular:  Irregular rhythm  Pulmonary/Chest: Effort normal and breath sounds normal. No respiratory  distress. He exhibits no tenderness.  Abdominal: Soft. There is no tenderness. There is no rebound and no guarding.  Musculoskeletal: He exhibits no edema or tenderness.  No tenderness over all 4 extremities. Patient is able to range all 4 extremities  Skin: Skin is warm and dry.  Psychiatric:  Mildly agitated  Nursing note and vitals reviewed.   ED Course  Procedures (including critical care time) Labs Review Labs Reviewed  CBC WITH DIFFERENTIAL/PLATELET - Abnormal; Notable for the following:    RBC 3.77 (*)    Hemoglobin 11.1 (*)  HCT 35.3 (*)    RDW 16.8 (*)    Neutrophils Relative % 87 (*)    Lymphocytes Relative 4 (*)    Lymphs Abs 0.4 (*)    All other components within normal limits  BASIC METABOLIC PANEL - Abnormal; Notable for the following:    Glucose, Bld 121 (*)    BUN 41 (*)    GFR calc non Af Amer 50 (*)    GFR calc Af Amer 58 (*)    All other components within normal limits  URINALYSIS, ROUTINE W REFLEX MICROSCOPIC - Abnormal; Notable for the following:    APPearance CLOUDY (*)    Leukocytes, UA MODERATE (*)    All other components within normal limits  URINE MICROSCOPIC-ADD ON - Abnormal; Notable for the following:    Bacteria, UA MANY (*)    All other components within normal limits  URINE CULTURE    Imaging Review Dg Chest 2 View  05/01/2014   CLINICAL DATA:  81 year old male found on floor next to wheelchair. Presumed fall.  EXAM: CHEST  2 VIEW  COMPARISON:  Prior chest x-ray 04/12/2014  FINDINGS: Stable cardiomegaly. Mediastinal contours remain unchanged. Left subclavian approach cardiac rhythm maintenance device with leads in the right atrium and right ventricle. No pneumothorax. No overt pulmonary edema. No acute osseous abnormality. Small layering left pleural effusion with associated bibasilar atelectasis.  IMPRESSION: 1. Small layering left pleural effusion with associated basilar atelectasis. 2. Stable cardiomegaly.   Electronically Signed   By:  Malachy Moan M.D.   On: 05/01/2014 17:56   Dg Hips Bilat With Pelvis 2v  05/01/2014   CLINICAL DATA:  Found on floor; acute onset of bilateral lower leg pain. Initial encounter.  EXAM: BILATERAL HIP (WITH PELVIS) 2 VIEWS  COMPARISON:  None.  FINDINGS: There is no evidence of fracture or dislocation. Both femoral heads are seated normally within their respective acetabula. Mild degenerative change is noted at the lower lumbar spine. The sacroiliac joints are unremarkable in appearance.  The visualized bowel gas pattern is grossly unremarkable in appearance.  IMPRESSION: No evidence of fracture or dislocation.   Electronically Signed   By: Roanna Raider M.D.   On: 05/01/2014 17:54     EKG Interpretation   Date/Time:  Thursday May 01 2014 15:21:04 EST Ventricular Rate:  95 PR Interval:    QRS Duration: 92 QT Interval:  371 QTC Calculation: 466 R Axis:   -44 Text Interpretation:  Atrial fibrillation Ventricular premature complex  Left axis deviation Baseline wander in lead(s) V3 Confirmed by Lincoln Brigham  [phone]) on 05/01/2014 4:20:13 PM      MDM   Final diagnoses:  Fall  Acute UTI    Patient here for evaluation of injuries following a fall. Patient without evidence of acute injuries on exam. UA is consistent with acute UTI. Patient is not septic on exam and is in no acute distress. Treating with one-time dose of Rocephin and prescription for Keflex as an outpatient. Cultures have been sent. Patient's daughter updated of findings and treatment plan.    Tilden Fossa, MD 05/01/14 [phone]

## 2014-05-05 LAB — URINE CULTURE: Colony Count: >=100000

## 2014-05-06 ENCOUNTER — Telehealth (HOSPITAL_BASED_OUTPATIENT_CLINIC_OR_DEPARTMENT_OTHER): Payer: Self-pay | Admitting: Emergency Medicine

## 2014-05-06 NOTE — Progress Notes (Signed)
ED Antimicrobial Stewardship Positive Culture Follow Up   [name] [name] is an 81 y.o. male [facility] with a chief complaint of  Chief Complaint  Patient presents with  . Fall    Recent Results (from the past 720 hour(s))  Urine culture     Status: None   Collection Time: 05/01/14  6:10 PM  Result Value Ref Range Status   Specimen Description URINE, CATHETERIZED  Final   Special Requests NONE  Final   Colony Count   Final    >=100,000 COLONIES/ML [facility]formed [facility]    Culture   Final    ENTEROCOCCUS SPECIES VIRIDANS STREPTOCOCCUS Performed at [facility]    Report Status 05/05/2014 FINAL  Final   Organism ID, Bacteria ENTEROCOCCUS SPECIES  Final      Susceptibility   Enterococcus species - MIC*    AMPICILLIN <=2 SENSITIVE Sensitive     LEVOFLOXACIN 1 SENSITIVE Sensitive     NITROFURANTOIN <=16 SENSITIVE Sensitive     VANCOMYCIN 1 SENSITIVE Sensitive     TETRACYCLINE >=16 RESISTANT Resistant     * ENTEROCOCCUS SPECIES    [x]  Treated with cephalexin, organism resistant to prescribed antimicrobial []  Patient discharged originally without antimicrobial agent and treatment is now indicated  New antibiotic prescription: amoxicillin 250mg  po TID x 7 days  ED Provider: Allen DerryMercedes Camprubi-Soms PA-C   Mickeal SkinnerFrens, Kiptyn Rafuse John 05/06/2014, 9:03 AM Infectious Diseases Pharmacist Phone# [phone]

## 2014-05-06 NOTE — Telephone Encounter (Signed)
Lab report urine [facility] per [facility] [address], fax # [phone]

## 2014-05-20 ENCOUNTER — Emergency Department (HOSPITAL_COMMUNITY): Payer: Medicare Other

## 2014-05-20 ENCOUNTER — Emergency Department (HOSPITAL_COMMUNITY)
Admission: EM | Admit: 2014-05-20 | Discharge: 2014-05-20 | Disposition: A | Discharge status: Home / Self Care | Payer: Medicare Other | Attending: Emergency Medicine | Admitting: Emergency Medicine

## 2014-05-20 ENCOUNTER — Encounter (HOSPITAL_COMMUNITY): Payer: Self-pay | Admitting: Emergency Medicine

## 2014-05-20 DIAGNOSIS — Z9861 Coronary angioplasty status: Secondary | ICD-10-CM | POA: Diagnosis not present

## 2014-05-20 DIAGNOSIS — Z87891 Personal history of nicotine dependence: Secondary | ICD-10-CM | POA: Diagnosis not present

## 2014-05-20 DIAGNOSIS — Z79899 Other long term (current) drug therapy: Secondary | ICD-10-CM | POA: Diagnosis not present

## 2014-05-20 DIAGNOSIS — Z7902 Long term (current) use of antithrombotics/antiplatelets: Secondary | ICD-10-CM | POA: Diagnosis not present

## 2014-05-20 DIAGNOSIS — Z7952 Long term (current) use of systemic steroids: Secondary | ICD-10-CM | POA: Diagnosis not present

## 2014-05-20 DIAGNOSIS — B3741 Candidal cystitis and urethritis: Secondary | ICD-10-CM | POA: Insufficient documentation

## 2014-05-20 DIAGNOSIS — I2510 Atherosclerotic heart disease of native coronary artery without angina pectoris: Secondary | ICD-10-CM | POA: Insufficient documentation

## 2014-05-20 DIAGNOSIS — Z950 Presence of cardiac pacemaker: Secondary | ICD-10-CM | POA: Diagnosis not present

## 2014-05-20 DIAGNOSIS — I509 Heart failure, unspecified: Secondary | ICD-10-CM | POA: Diagnosis not present

## 2014-05-20 DIAGNOSIS — F329 Major depressive disorder, single episode, unspecified: Secondary | ICD-10-CM | POA: Diagnosis not present

## 2014-05-20 DIAGNOSIS — E785 Hyperlipidemia, unspecified: Secondary | ICD-10-CM | POA: Diagnosis not present

## 2014-05-20 DIAGNOSIS — F0390 Unspecified dementia without behavioral disturbance: Secondary | ICD-10-CM | POA: Diagnosis not present

## 2014-05-20 DIAGNOSIS — I4891 Unspecified atrial fibrillation: Secondary | ICD-10-CM | POA: Diagnosis not present

## 2014-05-20 DIAGNOSIS — W1839XA Other fall on same level, initial encounter: Secondary | ICD-10-CM | POA: Insufficient documentation

## 2014-05-20 DIAGNOSIS — I252 Old myocardial infarction: Secondary | ICD-10-CM | POA: Diagnosis not present

## 2014-05-20 DIAGNOSIS — Y998 Other external cause status: Secondary | ICD-10-CM | POA: Diagnosis not present

## 2014-05-20 DIAGNOSIS — E119 Type 2 diabetes mellitus without complications: Secondary | ICD-10-CM | POA: Diagnosis not present

## 2014-05-20 DIAGNOSIS — K219 Gastro-esophageal reflux disease without esophagitis: Secondary | ICD-10-CM | POA: Diagnosis not present

## 2014-05-20 DIAGNOSIS — N189 Chronic kidney disease, unspecified: Secondary | ICD-10-CM | POA: Diagnosis not present

## 2014-05-20 DIAGNOSIS — W19XXXA Unspecified fall, initial encounter: Secondary | ICD-10-CM

## 2014-05-20 DIAGNOSIS — Y9389 Activity, other specified: Secondary | ICD-10-CM | POA: Insufficient documentation

## 2014-05-20 DIAGNOSIS — Z043 Encounter for examination and observation following other accident: Secondary | ICD-10-CM | POA: Insufficient documentation

## 2014-05-20 DIAGNOSIS — B3749 Other urogenital candidiasis: Secondary | ICD-10-CM

## 2014-05-20 DIAGNOSIS — Y9289 Other specified places as the place of occurrence of the external cause: Secondary | ICD-10-CM | POA: Diagnosis not present

## 2014-05-20 LAB — CBC
HEMATOCRIT: 35.4 % — AB (ref 39.0–52.0)
Hemoglobin: 11.3 g/dL — ABNORMAL LOW (ref 13.0–17.0)
MCH: 30.1 pg (ref 26.0–34.0)
MCHC: 31.9 g/dL (ref 30.0–36.0)
MCV: 94.4 fL (ref 78.0–100.0)
Platelets: 188 10*3/uL (ref 150–400)
RBC: 3.75 MIL/uL — AB (ref 4.22–5.81)
RDW: 16.8 % — ABNORMAL HIGH (ref 11.5–15.5)
WBC: 5.9 10*3/uL (ref 4.0–10.5)

## 2014-05-20 LAB — BASIC METABOLIC PANEL
Anion gap: 8 (ref 5–15)
BUN: 47 mg/dL — ABNORMAL HIGH (ref 6–23)
CHLORIDE: 109 mmol/L (ref 96–112)
CO2: 26 mmol/L (ref 19–32)
CREATININE: 1.28 mg/dL (ref 0.50–1.35)
Calcium: 8.9 mg/dL (ref 8.4–10.5)
GFR calc non Af Amer: 48 mL/min — ABNORMAL LOW (ref >90)
GFR, EST AFRICAN AMERICAN: 55 mL/min — AB (ref >90)
Glucose, Bld: 96 mg/dL (ref 70–99)
POTASSIUM: 4.8 mmol/L (ref 3.5–5.1)
Sodium: 143 mmol/L (ref 135–145)

## 2014-05-20 LAB — URINALYSIS, ROUTINE W REFLEX MICROSCOPIC
BILIRUBIN URINE: NEGATIVE
Glucose, UA: NEGATIVE mg/dL
KETONES UR: NEGATIVE mg/dL
Leukocytes, UA: NEGATIVE
NITRITE: NEGATIVE
Protein, ur: NEGATIVE mg/dL
Specific Gravity, Urine: 1.011 (ref 1.005–1.030)
UROBILINOGEN UA: 0.2 mg/dL (ref 0.0–1.0)
pH: 7 (ref 5.0–8.0)

## 2014-05-20 LAB — CK TOTAL AND CKMB (NOT AT ARMC)
CK TOTAL: 120 U/L (ref 7–232)
CK, MB: 3.5 ng/mL (ref 0.3–4.0)
Relative Index: 2.9 — ABNORMAL HIGH (ref 0.0–2.5)

## 2014-05-20 LAB — URINE MICROSCOPIC-ADD ON

## 2014-05-20 LAB — DIGOXIN LEVEL: Digoxin Level: 1.5 ng/mL (ref 0.8–2.0)

## 2014-05-20 MED ORDER — ACETAMINOPHEN 500 MG PO TABS
1000.0000 mg | ORAL_TABLET | Freq: Once | ORAL | Status: AC
  Administered 2014-05-20: 1000 mg via ORAL
  Filled 2014-05-20: qty 2

## 2014-05-20 MED ORDER — NYSTATIN 100000 UNIT/GM EX CREA: TOPICAL_CREAM | CUTANEOUS | Status: DC

## 2014-05-20 NOTE — ED Notes (Addendum)
Pt had unwitnessed fall and complain of back pain and right elbow with skin tear. A&O to his normal. Pt fell one time prior to unwitnessed fall. Pt from [facility].

## 2014-05-20 NOTE — ED Provider Notes (Signed)
CSN: [id number]     Arrival date & time 05/20/14  1239 History   First MD Initiated Contact with Patient 05/20/14 1309     Chief Complaint  Patient presents with  . Fall     (Consider location/radiation/quality/duration/timing/severity/associated sxs/prior Treatment) HPI Comments: Patient on Plavix, hx of dementia, frequent falls. Had unwitnessed fall at [facility] today. No known head injury. C/o back pain and left elbow. + sking tear Level 5 caveat due to dementia.   Patient is a 81 y.o. male presenting with fall.  Fall This is a recurrent problem. The current episode started today. The problem occurs every several days. The problem has been gradually worsening. Associated symptoms comments: C/o back pain. .    Past Medical History  Diagnosis Date  . Cardiac pacemaker in situ     s/p Viatron DDD Pacemaker for sinus node dysfunction  . Orthostatic hypotension   . Hyperlipidemia     mixed  . Atrial fibrillation     not felt to be a coumadin candidate due to falls  . Sinoatrial node dysfunction     s/p PPM (MDT)  . Coronary atherosclerosis of native coronary artery     s/p AWMI 2005 stent LAD  . Diabetes mellitus, type 2   . Chronic renal insufficiency, stage II (mild)   . Intolerance, drug     amniodarone  . Upper GI hemorrhage     2006 Mallory Weiss Tear  . MI (myocardial infarction)     coronary artery status post anterior wall myocardial infarction in 2005 treated with a stent to the left anterior descending.  . Dizziness     recurrent dizziness and falls  . Diabetes mellitus   . Hyperlipidemia   . Renal insufficiency     mild  . Dementia   . Depression   . CHF (congestive heart failure)   . GERD (gastroesophageal reflux disease)    Past Surgical History  Procedure Laterality Date  . Pacemaker placement    . Coronary stent placement     Family History  Problem Relation Age of Onset  . Coronary artery disease Brother   . Diabetes Mother    History  Substance  Use Topics  . Smoking status: Former Smoker    Quit date: 03/28/1960  . Smokeless tobacco: Not on file  . Alcohol Use: No    Review of Systems  Unable to perform ROS: Dementia      Allergies  Sulfonamide derivatives and Amiodarone hcl  Home Medications   Prior to Admission medications   Medication Sig Start Date End Date Taking? Authorizing Provider  acetaminophen (TYLENOL) 500 MG tablet Take 1,000 mg by mouth every 6 (six) hours as needed (for pain).    Yes Historical Provider, MD  atorvastatin (LIPITOR) 10 MG tablet Take 10 mg by mouth every morning.    Yes Historical Provider, MD  brimonidine (ALPHAGAN) 0.2 % ophthalmic solution Place 1 drop into both eyes 2 (two) times daily. Wait 3-5 minutes between each drop   Yes Historical Provider, MD  clopidogrel (PLAVIX) 75 MG tablet Take 75 mg by mouth every morning.    Yes Historical Provider, MD  Cranberry 500 MG CAPS Take 500 mg by mouth 2 (two) times daily.   Yes Historical Provider, MD  dextromethorphan (DELSYM) 30 MG/5ML liquid Take 5 mLs by mouth 2 (two) times daily as needed for cough.    Yes Historical Provider, MD  digoxin (LANOXIN) 0.25 MG tablet Take 250 mcg by mouth every morning. *  HOLD IF HEART RATE IS LESS THAN 60*   Yes Historical Provider, MD  Emollient (MINERIN) LOTN Apply 1 application topically at bedtime. Applies to feet   Yes Historical Provider, MD  fludrocortisone (FLORINEF) 0.1 MG tablet Take 0.1 mg by mouth 2 (two) times daily.    Yes Historical Provider, MD  furosemide (LASIX) 20 MG tablet Take 20 mg by mouth every morning.    Yes Historical Provider, MD  latanoprost (XALATAN) 0.005 % ophthalmic solution Place 1 drop into both eyes every evening. At 8 pm   Yes Historical Provider, MD  metoprolol tartrate (LOPRESSOR) 25 MG tablet Take 25 mg by mouth 2 (two) times daily.   Yes Historical Provider, MD  Multiple Vitamins-Minerals (T-VITES) TABS Take 1 tablet by mouth daily.   Yes Historical Provider, MD  Multiple  Vitamins-Minerals (THEREMS M PO) Take 1 tablet by mouth every morning.   Yes Historical Provider, MD  neomycin-bacitracin-polymyxin (NEOSPORIN) [phone] ointment Apply 1 application topically as directed. 1 application daily to right forearm skin tears until healed   Yes Historical Provider, MD  nystatin-triamcinolone (MYCOLOG II) cream Apply 1 application topically 2 (two) times daily. To inner thighs   Yes Historical Provider, MD  Polyethyl Glycol-Propyl Glycol (SYSTANE ULTRA) 0.4-0.3 % SOLN Place 1 drop into both eyes 4 (four) times daily.   Yes Historical Provider, MD  potassium chloride (KLOR-CON) 20 MEQ packet Take 20 mEq by mouth every morning. Mix with 4-6 ounces of water   Yes Historical Provider, MD  ranitidine (ZANTAC) 150 MG tablet Take 150 mg by mouth at bedtime.   Yes Historical Provider, MD  sodium chloride (OCEAN) 0.65 % SOLN nasal spray Place 2 sprays into both nostrils 2 (two) times daily.   Yes Historical Provider, MD  sodium chloride irrigation 0.9 % irrigation Irrigate with 1 application as directed as directed. Clean right forearm skin tears daily until healed.   Yes Historical Provider, MD  venlafaxine (EFFEXOR) 37.5 MG tablet Take 37.5 mg by mouth every morning.    Yes Historical Provider, MD  White Petrolatum-Mineral Oil (GENTEAL PM) 85-15 % OINT Place 1 application into both eyes 4 (four) times daily.   Yes Historical Provider, MD  cefUROXime (CEFTIN) 250 MG tablet Take 1 tablet (250 mg total) by mouth 2 (two) times daily with a meal. Patient not taking: Reported on 03/03/2014 12/30/13   Jeralyn Bennett, MD  cephALEXin (KEFLEX) 500 MG capsule Take 1 capsule (500 mg total) by mouth 2 (two) times daily. Patient not taking: Reported on 05/20/2014 05/01/14   Tilden Fossa, MD  hydrocortisone cream 1 % Apply topically 2 (two) times daily. Apply to affected areas twice daily for 1 week Patient not taking: Reported on 03/03/2014 12/30/13   Jeralyn Bennett, MD   BP 167/84 mmHg  Pulse  57  Resp 16  SpO2 98% Physical Exam  Constitutional: He appears well-developed and well-nourished. No distress.  HENT:  Head: Normocephalic and atraumatic.  Eyes: Conjunctivae and EOM are normal. Pupils are equal, round, and reactive to light.  Neck:  In c collar  Cardiovascular: Normal rate and regular rhythm.   Pulmonary/Chest: Effort normal and breath sounds normal. No respiratory distress.  Abdominal: Soft. Bowel sounds are normal.  Musculoskeletal:  No midline spinal tenderness.  R elbow with large skin tear. Well approximated with bandages. No bony tenderness.  Nursing note and vitals reviewed.   ED Course  Procedures (including critical care time) Labs Review Labs Reviewed  CBC - Abnormal; Notable for the following:  RBC 3.75 (*)    Hemoglobin 11.3 (*)    HCT 35.4 (*)    RDW 16.8 (*)    All other components within normal limits  BASIC METABOLIC PANEL - Abnormal; Notable for the following:    BUN 47 (*)    GFR calc non Af Amer 48 (*)    GFR calc Af Amer 55 (*)    All other components within normal limits  URINALYSIS, ROUTINE W REFLEX MICROSCOPIC - Abnormal; Notable for the following:    Hgb urine dipstick TRACE (*)    All other components within normal limits  DIGOXIN LEVEL  URINE MICROSCOPIC-ADD ON  CK TOTAL AND CKMB    Imaging Review Dg Thoracic Spine 2 View  05/20/2014   CLINICAL DATA:  Unwitnessed fall with back pain.  EXAM: THORACIC SPINE - 2 VIEW  COMPARISON:  Chest radiograph of 05/01/2014.  FINDINGS: pacer. Coronary artery stent. Mild over penetration of upper thoracic vertebral bodies on the frontal radiograph.  Lateral view images from approximately TT through the bottom of L1. Approximately T5 vertebral body height loss is felt to be similar to on the prior plain film. Mild, without ventral canal encroachment. Moderate spondylosis, including anterior osteophytes at T10-11 and T11-T12. Cervicothoracic junction grossly within normal limits on swimmer's  view.  IMPRESSION: Spondylosis, without definite acute osseous finding. Upper vertebral body height loss is mild (Approximately T5). This is felt to be similar to on the prior.   Electronically Signed   By: Jeronimo Greaves M.D.   On: 05/20/2014 14:33   Dg Elbow Complete Right  05/20/2014   CLINICAL DATA:  Fall with elbow pain and skin tear.  EXAM: RIGHT ELBOW - COMPLETE 3+ VIEW  COMPARISON:  None.  FINDINGS: Degenerative irregularity about the medial epicondyle of the humerus. Enthesopathic change at the triceps insertion. No acute fracture or dislocation. No joint effusion.  IMPRESSION: No acute osseous abnormality.   Electronically Signed   By: Jeronimo Greaves M.D.   On: 05/20/2014 14:30   Ct Head Wo Contrast  05/20/2014   CLINICAL DATA:  Lethargy following fall  EXAM: CT HEAD WITHOUT CONTRAST  CT CERVICAL SPINE WITHOUT CONTRAST  TECHNIQUE: Multidetector CT imaging of the head and cervical spine was performed following the standard protocol without intravenous contrast. Multiplanar CT image reconstructions of the cervical spine were also generated.  COMPARISON:  December 28, 2013  FINDINGS: CT HEAD FINDINGS  Moderate diffuse atrophy is stable. There is no intracranial mass, hemorrhage, extra-axial fluid collection, or midline shift. Small vessel disease in the centra semiovale bilaterally is stable. There is evidence of a prior small infarct in the superior left lentiform nucleus, stable. There is small vessel disease in each superior thalamus, stable. There is no new gray-white compartment lesion. No acute infarct apparent. The bony calvarium appears intact. The mastoid air cells are clear.  CT CERVICAL SPINE FINDINGS  There is no appreciable fracture. There is stable slight retrolisthesis of C3 on C4. There is stable slight anterolisthesis of C4 on C5. There is stable slight anterolisthesis of C7 on T1. There is no new spondylolisthesis. Prevertebral soft tissues and predental space regions are normal. There is  fairly marked disc space narrowing at C2-3, C3-4, and C5-6. There is moderate disc space narrowing at other levels. There is facet hypertrophy at essentially all levels bilaterally. There is no frank disc extrusion or stenosis.  There is calcification in each carotid artery.  There is a left pleural effusion.  IMPRESSION: CT head: Stable  atrophy with periventricular small vessel disease. Prior small lacunar infarct in the superior left lentiform nucleus, stable. No intracranial mass, hemorrhage, or extra-axial fluid collection. No acute infarct apparent.  CT cervical spine: Multilevel osteoarthritic change. Stable areas of spondylolisthesis at C3-4, C4-5, and C7-T1, felt to be due to underlying spondylosis. No fracture apparent. There is a left pleural effusion. There are foci of carotid artery calcification bilaterally.   Electronically Signed   By: Bretta BangWilliam  Woodruff III M.D.   On: 05/20/2014 14:23   Ct Cervical Spine Wo Contrast  05/20/2014   CLINICAL DATA:  Lethargy following fall  EXAM: CT HEAD WITHOUT CONTRAST  CT CERVICAL SPINE WITHOUT CONTRAST  TECHNIQUE: Multidetector CT imaging of the head and cervical spine was performed following the standard protocol without intravenous contrast. Multiplanar CT image reconstructions of the cervical spine were also generated.  COMPARISON:  December 28, 2013  FINDINGS: CT HEAD FINDINGS  Moderate diffuse atrophy is stable. There is no intracranial mass, hemorrhage, extra-axial fluid collection, or midline shift. Small vessel disease in the centra semiovale bilaterally is stable. There is evidence of a prior small infarct in the superior left lentiform nucleus, stable. There is small vessel disease in each superior thalamus, stable. There is no new gray-white compartment lesion. No acute infarct apparent. The bony calvarium appears intact. The mastoid air cells are clear.  CT CERVICAL SPINE FINDINGS  There is no appreciable fracture. There is stable slight retrolisthesis of  C3 on C4. There is stable slight anterolisthesis of C4 on C5. There is stable slight anterolisthesis of C7 on T1. There is no new spondylolisthesis. Prevertebral soft tissues and predental space regions are normal. There is fairly marked disc space narrowing at C2-3, C3-4, and C5-6. There is moderate disc space narrowing at other levels. There is facet hypertrophy at essentially all levels bilaterally. There is no frank disc extrusion or stenosis.  There is calcification in each carotid artery.  There is a left pleural effusion.  IMPRESSION: CT head: Stable atrophy with periventricular small vessel disease. Prior small lacunar infarct in the superior left lentiform nucleus, stable. No intracranial mass, hemorrhage, or extra-axial fluid collection. No acute infarct apparent.  CT cervical spine: Multilevel osteoarthritic change. Stable areas of spondylolisthesis at C3-4, C4-5, and C7-T1, felt to be due to underlying spondylosis. No fracture apparent. There is a left pleural effusion. There are foci of carotid artery calcification bilaterally.   Electronically Signed   By: Bretta BangWilliam  Woodruff III M.D.   On: 05/20/2014 14:23     EKG Interpretation None      MDM   Final diagnoses:  Fall    Elderly patient with dementia. Fall at nursing home. At baseline mental status. Negative imaging. Labs at basline awaiting total CPK r/o rhabdo. [facility]     Arthor CaptainAbigail Jovante Hammitt, PA-C 05/22/14 [id number]  Mirian MoMatthew Gentry, MD 05/22/14 [phone]

## 2014-05-20 NOTE — Discharge Instructions (Signed)
Fall Prevention in Hospitals As a hospital patient, your condition and the treatments you receive can increase your risk for falls. Some additional risk factors for falls in a hospital include:  Being in an unfamiliar environment.  Being on bed rest.  Your surgery.  Taking certain medicines.  Your tubing requirements, such as intravenous (IV) therapy or catheters. It is important that you learn how to decrease fall risks while at the hospital. Below are important tips that can help prevent falls. SAFETY TIPS FOR PREVENTING FALLS Talk about your risk of falling.  Ask your caregiver why you are at risk for falling. Is it your medicine, illness, tubing placement, or something else?  Make a plan with your caregiver to keep you safe from falls.  Ask your caregiver or pharmacist about side effect of your medicines. Some medicines can make you dizzy or affect your coordination. Ask for help.  Ask for help before getting out of bed. You may need to press your call button.  Ask for assistance in getting you safely to the toilet.  Ask for a walker or cane to be put at your bedside. Ask that most of the side rails on your bed be placed up before your caregiver leaves the room.  Ask family or friends to sit with you.  Ask for things that are out of your reach, such as your glasses, hearing aids, telephone, bedside table, or call button. Follow these tips to avoid falling:  Stay lying or seated, rather than standing, while waiting for help.  Wear rubber-soled slippers or shoes whenever you walk in the hospital.  Avoid quick, sudden movements.  Change positions slowly.  Sit on the side of your bed before standing.  Stand up slowly and wait before you start to walk.  Let your caregiver know if there is a spill on the floor.  Pay careful attention to the medical equipment, electrical cords, and tubes around you.  When you need help, use your call button by your bed or in the  bathroom. Wait for one of your caregivers to help you.  If you feel dizzy or unsure of your footing, return to bed and wait for assistance.  Avoid being distracted by the TV, telephone, or another person in your room.  Do not lean or support yourself on rolling objects, such as IV poles or bedside tables. Document Released: 03/11/2000 Document Revised: 02/29/2012 Document Reviewed: 11/20/2011 [facility] Patient Information 2015 [place], [place]. This information is not intended to replace advice given to you by your health care provider. Make sure you discuss any questions you have with your health care provider.  Cutaneous Candidiasis Cutaneous candidiasis is a condition in which there is an overgrowth of yeast (candida) on the skin. Yeast normally live on the skin, but in small enough numbers not to cause any symptoms. In certain cases, increased growth of the yeast may cause an actual yeast infection. This kind of infection usually occurs in areas of the skin that are constantly warm and moist, such as the armpits or the groin. Yeast is the most common cause of diaper rash in babies and in people who cannot control their bowel movements (incontinence). CAUSES  The fungus that most often causes cutaneous candidiasis is Candida albicans. Conditions that can increase the risk of getting a yeast infection of the skin include:  Obesity.  Pregnancy.  Diabetes.  Taking antibiotic medicine.  Taking birth control pills.  Taking steroid medicines.  Thyroid disease.  An iron or zinc  deficiency.  Problems with the immune system. SYMPTOMS   Red, swollen area of the skin.  Bumps on the skin.  Itchiness. DIAGNOSIS  The diagnosis of cutaneous candidiasis is usually based on its appearance. Light scrapings of the skin may also be taken and viewed under a microscope to identify the presence of yeast. TREATMENT  Antifungal creams may be applied to the infected skin. In severe cases, oral  medicines may be needed.  HOME CARE INSTRUCTIONS   Keep your skin clean and dry.  Maintain a healthy weight.  If you have diabetes, keep your blood sugar under control. SEEK IMMEDIATE MEDICAL CARE IF:  Your rash continues to spread despite treatment.  You have a fever, chills, or abdominal pain. Document Released: 11/30/2010 Document Revised: 06/06/2011 Document Reviewed: 11/30/2010 [facility] Patient Information 2015 [place], [place]. This information is not intended to replace advice given to you by your health care provider. Make sure you discuss any questions you have with your health care provider.

## 2014-05-20 NOTE — ED Provider Notes (Signed)
Patient care assumed from FraserAbi Harris, PA-C at shift change. Please see her note for further.  Waiting on CK to return prior to discharge, which is normal.  Patient has what is consistent with a candidal skin infection in groin and right buttocks. Will treat with nystatin cream. Will discharge to care of skilled nursing facility and follow up with PCP this week at SNF.   Lawana ChambersWilliam Duncan Khaliyah Northrop, PA-C 05/20/14 1805  Raeford RazorStephen Kohut, MD 05/22/14 1058

## 2014-05-20 NOTE — Progress Notes (Signed)
CSW met with patient at bedside. There was no family present. Pt was eating independently at bedside. Pt stated that he stayed at home. However, a nurse informed CSW that pt comes from a facility. Per chart, pt is in the memory care unit and has a hx of dementia, weakness, and diabetes.   Nurse informed CSW that pt will be discharged soon.  Willette Brace [phone] ED CSW 05/20/2014 8:27 PM

## 2014-05-20 NOTE — ED Notes (Signed)
[facility] Expected date:  Expected time:  Means of arrival:  Comments: Ems- elderly fall

## 2014-05-22 ENCOUNTER — Ambulatory Visit (INDEPENDENT_AMBULATORY_CARE_PROVIDER_SITE_OTHER): Payer: Medicare Other | Admitting: Internal Medicine

## 2014-05-22 ENCOUNTER — Encounter: Payer: Self-pay | Admitting: Internal Medicine

## 2014-05-22 VITALS — BP 130/68 | HR 70 | Ht 69.0 in

## 2014-05-22 DIAGNOSIS — I495 Sick sinus syndrome: Secondary | ICD-10-CM

## 2014-05-22 DIAGNOSIS — I482 Chronic atrial fibrillation, unspecified: Secondary | ICD-10-CM

## 2014-05-22 LAB — MDC_IDC_ENUM_SESS_TYPE_INCLINIC
Brady Statistic RV Percent Paced: 29 %
Date Time Interrogation Session: 20160225123544
Implantable Pulse Generator Serial Number: 2706030895
Lead Channel Impedance Value: 400 Ohm
Lead Channel Impedance Value: 450 Ohm
Lead Channel Pacing Threshold Amplitude: 0.75 V
Lead Channel Pacing Threshold Pulse Width: 0.4 ms
Lead Channel Setting Pacing Amplitude: 2.5 V
Lead Channel Setting Pacing Pulse Width: 0.4 ms
Lead Channel Setting Sensing Sensitivity: 2 mV
MDC IDC MSMT BATTERY IMPEDANCE: 2100 Ohm
MDC IDC MSMT BATTERY VOLTAGE: 2.75 V
MDC IDC MSMT LEADCHNL RA IMPEDANCE VALUE: 300 Ohm
MDC IDC MSMT LEADCHNL RA SENSING INTR AMPL: 0.5 mV
MDC IDC MSMT LEADCHNL RV IMPEDANCE VALUE: 500 Ohm
MDC IDC MSMT LEADCHNL RV SENSING INTR AMPL: 8 mV

## 2014-05-22 NOTE — Progress Notes (Signed)
PCP:  Florentina Jenny, MD Primary Cardiologist:  Dr Hardin Negus is a 81 y.o. male with a h/o bradycardia sp PPM (Vitatron) by Dr Juanda Chance 02/15/05  who presents today to for follow-up in the Electrophysiology device clinic.  He is quite frail.  He falls asleep frequently during the exam.  He reports R hip pain when awake.  He seems quite miserable.  His daughter suggests that this is a chronic state.  He has chronic pain.  He also falls frequently.  Today, he  denies symptoms of palpitations, exertional chest pain, shortness of breath,  presyncope, syncope, or neurologic sequela.  The patientis tolerating medications without difficulties and is otherwise without complaint today.   Past Medical History  Diagnosis Date  . Cardiac pacemaker in situ     s/p Viatron DDD Pacemaker for sinus node dysfunction  . Orthostatic hypotension   . Hyperlipidemia     mixed  . Atrial fibrillation     not felt to be a coumadin candidate due to falls  . Sinoatrial node dysfunction     s/p PPM (MDT)  . Coronary atherosclerosis of native coronary artery     s/p AWMI 2005 stent LAD  . Diabetes mellitus, type 2   . Chronic renal insufficiency, stage II (mild)   . Intolerance, drug     amniodarone  . Upper GI hemorrhage     2006 Mallory Weiss Tear  . MI (myocardial infarction)     coronary artery status post anterior wall myocardial infarction in 2005 treated with a stent to the left anterior descending.  . Dizziness     recurrent dizziness and falls  . Diabetes mellitus   . Hyperlipidemia   . Renal insufficiency     mild  . Dementia   . Depression   . CHF (congestive heart failure)   . GERD (gastroesophageal reflux disease)     Past Surgical History  Procedure Laterality Date  . Pacemaker placement    . Coronary stent placement      History   Social History  . Marital Status: Widowed    Spouse Name: N/A  . Number of Children: N/A  . Years of Education: N/A   Occupational History   . retired     now living in [facility]   Social History Main Topics  . Smoking status: Former Smoker    Quit date: 03/28/1960  . Smokeless tobacco: Not on file  . Alcohol Use: No  . Drug Use: No  . Sexual Activity: Not on file   Other Topics Concern  . 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  Accompanied by daughter today.    Family History  Problem Relation Age of Onset  . Coronary artery disease Brother   . Diabetes Mother     Allergies  Allergen Reactions  . Sulfonamide Derivatives Hives  . Amiodarone Hcl Rash    Current Outpatient Prescriptions  Medication Sig Dispense Refill  . acetaminophen (TYLENOL) 500 MG tablet Take 1,000 mg by mouth every 6 (six) hours as needed (for pain).     Marland Kitchen atorvastatin (LIPITOR) 10 MG tablet Take 10 mg by mouth every morning.     . brimonidine (ALPHAGAN) 0.2 % ophthalmic solution Place 1 drop into both eyes 2 (two) times daily. Wait 3-5 minutes between each drop    . clopidogrel (PLAVIX) 75 MG tablet Take 75 mg by mouth every morning.     Marland Kitchen  Cranberry 500 MG CAPS Take 500 mg by mouth 2 (two) times daily.    Marland Kitchen dextromethorphan (DELSYM) 30 MG/5ML liquid Take 5 mLs by mouth 2 (two) times daily as needed for cough.     . digoxin (LANOXIN) 0.25 MG tablet Take 250 mcg by mouth every morning. *HOLD IF HEART RATE IS LESS THAN 60*    . Emollient (MINERIN) LOTN Apply 1 application topically at bedtime. Applies to feet    . fludrocortisone (FLORINEF) 0.1 MG tablet Take 0.1 mg by mouth 2 (two) times daily.     . furosemide (LASIX) 20 MG tablet Take 20 mg by mouth every morning.     . latanoprost (XALATAN) 0.005 % ophthalmic solution Place 1 drop into both eyes every evening. At 8 pm    . metoprolol tartrate (LOPRESSOR) 25 MG tablet Take 25 mg by mouth 2 (two) times daily.    . Multiple Vitamins-Minerals (T-VITES) TABS Take 1 tablet by mouth daily.    . Multiple  Vitamins-Minerals (THEREMS M PO) Take 1 tablet by mouth every morning.    . neomycin-bacitracin-polymyxin (NEOSPORIN) [phone] ointment Apply 1 application topically as directed. 1 application daily to right forearm skin tears until healed    . nystatin cream (MYCOSTATIN) Apply to affected area 2 times daily 30 g 0  . nystatin-triamcinolone (MYCOLOG II) cream Apply 1 application topically 2 (two) times daily. To inner thighs    . Polyethyl Glycol-Propyl Glycol (SYSTANE ULTRA) 0.4-0.3 % SOLN Place 1 drop into both eyes 4 (four) times daily.    . potassium chloride (KLOR-CON) 20 MEQ packet Take 20 mEq by mouth every morning. Mix with 4-6 ounces of water    . ranitidine (ZANTAC) 150 MG tablet Take 150 mg by mouth at bedtime.    . sodium chloride (OCEAN) 0.65 % SOLN nasal spray Place 2 sprays into both nostrils 2 (two) times daily.    . sodium chloride irrigation 0.9 % irrigation 1 application irrigation as directed. Clean right forearm skin tears daily until healed.    . venlafaxine (EFFEXOR) 37.5 MG tablet Take 37.5 mg by mouth every morning.     Cliffton Asters Petrolatum-Mineral Oil (GENTEAL PM) 85-15 % OINT Place 1 application into both eyes 4 (four) times daily.     No current facility-administered medications for this visit.    Physical Exam: Filed Vitals:   05/22/14 1002  BP: 130/68  Pulse: 70  Height:  (1.753 m)    GEN- The patient is elderly,falls asleep frequently during the exam  Head- normocephalic, atraumatic Eyes-  Sclera clear, conjunctiva pink Ears- hearing intact Oropharynx- clear Neck- supple,  Lungs- Clear to ausculation bilaterally, normal work of breathing Chest- pacemaker pocket is well healed Heart- irregular rate and rhythm,  GI- soft, NT, ND, + BS Extremities- no clubbing, cyanosis, trace edema MS- age appropriate muscle atrophy Psych- significant dementia.  He is unable to tell me where he lives  Pacemaker interrogation- reviewed in detail today,  See  PACEART report  Assessment and Plan:  1. Symptomatic bradycardia Normal pacemaker function See Pace Art report No changes today  2. afib chads2vasc is >4 Given frequent falls, he is not a candidate for anticoagulation.  I think that we should stop plavix.  His daughter will consider making this change. We discussed his fragility.  He would not be a candidate for CNS procedure should he fall and have ICH.  He is appropriately DNR/DNI at this time.  3. CAD Stable No change required today  4. HTN Stable No change required today   [employer] every 3 months Return to see Gypsy BalsamAmber Seiler NP in 1 year Follow-up with Dr Clifton JamesMcAlhany as scheduled

## 2014-05-22 NOTE — Patient Instructions (Signed)
Your physician wants you to follow-up in: 6 months with Dr Clifton JamesMcAlhany and 12 months wit Gypsy BalsamAmber Seiler, NP You will receive a reminder letter in the mail two months in advance. If you don't receive a letter, please call our office to schedule the follow-up appointment.

## 2014-05-23 ENCOUNTER — Emergency Department (HOSPITAL_COMMUNITY): Payer: Medicare Other

## 2014-05-23 ENCOUNTER — Emergency Department (HOSPITAL_COMMUNITY)
Admission: EM | Admit: 2014-05-23 | Discharge: 2014-05-23 | Disposition: A | Discharge status: Skilled Nursing Facility | Payer: Medicare Other | Attending: Emergency Medicine | Admitting: Emergency Medicine

## 2014-05-23 ENCOUNTER — Encounter (HOSPITAL_COMMUNITY): Payer: Self-pay | Admitting: *Deleted

## 2014-05-23 ENCOUNTER — Encounter: Payer: Self-pay | Admitting: Internal Medicine

## 2014-05-23 DIAGNOSIS — I252 Old myocardial infarction: Secondary | ICD-10-CM | POA: Diagnosis not present

## 2014-05-23 DIAGNOSIS — Y9389 Activity, other specified: Secondary | ICD-10-CM | POA: Insufficient documentation

## 2014-05-23 DIAGNOSIS — S51812A Laceration without foreign body of left forearm, initial encounter: Secondary | ICD-10-CM

## 2014-05-23 DIAGNOSIS — S0083XA Contusion of other part of head, initial encounter: Secondary | ICD-10-CM

## 2014-05-23 DIAGNOSIS — Z950 Presence of cardiac pacemaker: Secondary | ICD-10-CM | POA: Diagnosis not present

## 2014-05-23 DIAGNOSIS — F0390 Unspecified dementia without behavioral disturbance: Secondary | ICD-10-CM | POA: Diagnosis not present

## 2014-05-23 DIAGNOSIS — E785 Hyperlipidemia, unspecified: Secondary | ICD-10-CM | POA: Diagnosis not present

## 2014-05-23 DIAGNOSIS — Z87891 Personal history of nicotine dependence: Secondary | ICD-10-CM | POA: Diagnosis not present

## 2014-05-23 DIAGNOSIS — Y9289 Other specified places as the place of occurrence of the external cause: Secondary | ICD-10-CM | POA: Diagnosis not present

## 2014-05-23 DIAGNOSIS — K219 Gastro-esophageal reflux disease without esophagitis: Secondary | ICD-10-CM | POA: Insufficient documentation

## 2014-05-23 DIAGNOSIS — S51802A Unspecified open wound of left forearm, initial encounter: Secondary | ICD-10-CM | POA: Diagnosis not present

## 2014-05-23 DIAGNOSIS — Z79899 Other long term (current) drug therapy: Secondary | ICD-10-CM | POA: Diagnosis not present

## 2014-05-23 DIAGNOSIS — N189 Chronic kidney disease, unspecified: Secondary | ICD-10-CM | POA: Diagnosis not present

## 2014-05-23 DIAGNOSIS — Z7952 Long term (current) use of systemic steroids: Secondary | ICD-10-CM | POA: Insufficient documentation

## 2014-05-23 DIAGNOSIS — I509 Heart failure, unspecified: Secondary | ICD-10-CM | POA: Insufficient documentation

## 2014-05-23 DIAGNOSIS — W1839XA Other fall on same level, initial encounter: Secondary | ICD-10-CM | POA: Insufficient documentation

## 2014-05-23 DIAGNOSIS — I2510 Atherosclerotic heart disease of native coronary artery without angina pectoris: Secondary | ICD-10-CM | POA: Insufficient documentation

## 2014-05-23 DIAGNOSIS — Y998 Other external cause status: Secondary | ICD-10-CM | POA: Insufficient documentation

## 2014-05-23 DIAGNOSIS — F329 Major depressive disorder, single episode, unspecified: Secondary | ICD-10-CM | POA: Insufficient documentation

## 2014-05-23 DIAGNOSIS — E119 Type 2 diabetes mellitus without complications: Secondary | ICD-10-CM | POA: Diagnosis not present

## 2014-05-23 DIAGNOSIS — S59912A Unspecified injury of left forearm, initial encounter: Secondary | ICD-10-CM | POA: Diagnosis present

## 2014-05-23 DIAGNOSIS — W19XXXA Unspecified fall, initial encounter: Secondary | ICD-10-CM

## 2014-05-23 LAB — URINALYSIS, ROUTINE W REFLEX MICROSCOPIC
Bilirubin Urine: NEGATIVE
GLUCOSE, UA: NEGATIVE mg/dL
Hgb urine dipstick: NEGATIVE
Ketones, ur: NEGATIVE mg/dL
Leukocytes, UA: NEGATIVE
Nitrite: NEGATIVE
Protein, ur: 30 mg/dL — AB
SPECIFIC GRAVITY, URINE: 1.02 (ref 1.005–1.030)
UROBILINOGEN UA: 1 mg/dL (ref 0.0–1.0)
pH: 6 (ref 5.0–8.0)

## 2014-05-23 LAB — URINE MICROSCOPIC-ADD ON

## 2014-05-23 NOTE — Discharge Instructions (Signed)
Skin Tear Care  A skin tear is a wound in which the top layer of skin has peeled off. This is a common problem with aging because the skin becomes thinner and more fragile as a person gets older. In addition, some medicines, such as oral corticosteroids, can lead to skin thinning if taken for long periods of time.   A skin tear is often repaired with tape or skin adhesive strips. This keeps the skin that has been peeled off in contact with the healthier skin beneath. Depending on the location of the wound, a bandage (dressing) may be applied over the tape or skin adhesive strips. Sometimes, during the healing process, the skin turns black and dies. Even when this happens, the torn skin acts as a good dressing until the skin underneath gets healthier and repairs itself.  HOME CARE INSTRUCTIONS   · Change dressings once per day or as directed by your caregiver.  ¨ Gently clean the skin tear and the area around the tear using saline solution or mild soap and water.  ¨ Do not rub the injured skin dry. Let the area air dry.  ¨ Apply petroleum jelly or an antibiotic cream or ointment to keep the tear moist. This will help the wound heal. Do not allow a scab to form.  ¨ If the dressing sticks before the next dressing change, moisten it with warm soapy water and gently remove it.  · Protect the injured skin until it has healed.  · Only take over-the-counter or prescription medicines as directed by your caregiver.  · Take showers or baths using warm soapy water. Apply a new dressing after the shower or bath.  · Keep all follow-up appointments as directed by your caregiver.    SEEK IMMEDIATE MEDICAL CARE IF:   · You have redness, swelling, or increasing pain in the skin tear.  · You have pus coming from the skin tear.  · You have chills.  · You have a red streak that goes away from the skin tear.  · You have a bad smell coming from the tear or dressing.  · You have a fever or persistent symptoms for more than 2-3 days.  · You  have a fever and your symptoms suddenly get worse.  MAKE SURE YOU:  · Understand these instructions.  · Will watch this condition.  · Will get help right away if your child is not doing well or gets worse.  Document Released: 12/07/2000 Document Revised: 12/07/2011 Document Reviewed: 09/26/2011  ExitCare® Patient Information ©2015 [employer]. This information is not intended to replace advice given to you by your health care provider. Make sure you discuss any questions you have with your health care provider.

## 2014-05-23 NOTE — ED Notes (Signed)
[facility] in [facility], per report pt had witnessed fall from standing position using walker, -LOC, pt c/o chronic pain, alert to baseline, EMS reports facility reported stating that the pt has become more decline in his daily activity, pt has 5 inch skin tear to L lower arm bleeding controlled, pt has hematoma to L anterior forehead, & 2 cm skin tear to R lower leg, pt Alert to person & place, disoriented to time & event, pt reported to be at neuro baseline, follows commands, moves all extremities

## 2014-05-23 NOTE — ED Provider Notes (Signed)
CSN: [id number]     Arrival date & time 05/23/14  1425 History   First MD Initiated Contact with Patient 05/23/14 1505     Chief Complaint  Patient presents with  . Fall    Level V caveat due to dementia (Consider location/radiation/quality/duration/timing/severity/associated sxs/prior Treatment) Patient is a 81 y.o. male presenting with fall. The history is provided by the patient, the nursing home and the EMS personnel.  Fall This is a recurrent problem.   patient with fall witnessed while standing. Reportedly just fell while using his walker. He has had 7 falls but came to the ER in the last 6 months. He is on Plavix for atrial fibrillation. 3 days ago it appears that there was discussion about stopping the Plavix and the family was considering it. Already was not on Coumadin due to his falls. He is demented and reportedly at his baseline. States that he hurts all over which is chronic for him.  Past Medical History  Diagnosis Date  . Cardiac pacemaker in situ     s/p Viatron DDD Pacemaker for sinus node dysfunction  . Orthostatic hypotension   . Hyperlipidemia     mixed  . Atrial fibrillation     not felt to be a coumadin candidate due to falls  . Sinoatrial node dysfunction     s/p PPM (MDT)  . Coronary atherosclerosis of native coronary artery     s/p AWMI 2005 stent LAD  . Diabetes mellitus, type 2   . Chronic renal insufficiency, stage II (mild)   . Intolerance, drug     amniodarone  . Upper GI hemorrhage     2006 Mallory Weiss Tear  . MI (myocardial infarction)     coronary artery status post anterior wall myocardial infarction in 2005 treated with a stent to the left anterior descending.  . Dizziness     recurrent dizziness and falls  . Diabetes mellitus   . Hyperlipidemia   . Renal insufficiency     mild  . Dementia   . Depression   . CHF (congestive heart failure)   . GERD (gastroesophageal reflux disease)    Past Surgical History  Procedure Laterality Date   . Pacemaker placement    . Coronary stent placement     Family History  Problem Relation Age of Onset  . Coronary artery disease Brother   . Diabetes Mother    History  Substance Use Topics  . Smoking status: Former Smoker    Quit date: 03/28/1960  . Smokeless tobacco: Not on file  . Alcohol Use: No    Review of Systems  Unable to perform ROS     Allergies  Sulfonamide derivatives and Amiodarone hcl  Home Medications   Prior to Admission medications   Medication Sig Start Date End Date Taking? Authorizing Provider  acetaminophen (TYLENOL) 500 MG tablet Take 1,000 mg by mouth every 6 (six) hours as needed (for pain).    Yes Historical Provider, MD  atorvastatin (LIPITOR) 10 MG tablet Take 10 mg by mouth every morning.    Yes Historical Provider, MD  brimonidine (ALPHAGAN) 0.2 % ophthalmic solution Place 1 drop into both eyes 2 (two) times daily. Wait 3-5 minutes between each drop   Yes Historical Provider, MD  Cranberry 500 MG CAPS Take 500 mg by mouth 2 (two) times daily.   Yes Historical Provider, MD  dextromethorphan (DELSYM) 30 MG/5ML liquid Take 5 mLs by mouth 2 (two) times daily as needed for cough.  Yes Historical Provider, MD  digoxin (LANOXIN) 0.25 MG tablet Take 250 mcg by mouth every morning. *HOLD IF HEART RATE IS LESS THAN 60*   Yes Historical Provider, MD  Emollient (MINERIN) LOTN Apply 1 application topically at bedtime. Applies to feet   Yes Historical Provider, MD  fludrocortisone (FLORINEF) 0.1 MG tablet Take 0.1 mg by mouth 2 (two) times daily.    Yes Historical Provider, MD  furosemide (LASIX) 20 MG tablet Take 20 mg by mouth every morning.    Yes Historical Provider, MD  latanoprost (XALATAN) 0.005 % ophthalmic solution Place 1 drop into both eyes every evening. At 8 pm   Yes Historical Provider, MD  metoprolol tartrate (LOPRESSOR) 25 MG tablet Take 25 mg by mouth 2 (two) times daily.   Yes Historical Provider, MD  Multiple Vitamins-Minerals (T-VITES)  TABS Take 1 tablet by mouth daily.   Yes Historical Provider, MD  Multiple Vitamins-Minerals (THEREMS M PO) Take 1 tablet by mouth every morning.   Yes Historical Provider, MD  neomycin-bacitracin-polymyxin (NEOSPORIN) [phone] ointment Apply 1 application topically as directed. 1 application daily to right forearm skin tears until healed   Yes Historical Provider, MD  nystatin-triamcinolone (MYCOLOG II) cream Apply 1 application topically 2 (two) times daily. To inner thighs   Yes Historical Provider, MD  Polyethyl Glycol-Propyl Glycol (SYSTANE ULTRA) 0.4-0.3 % SOLN Place 1 drop into both eyes 4 (four) times daily.   Yes Historical Provider, MD  potassium chloride (KLOR-CON) 20 MEQ packet Take 20 mEq by mouth every morning. Mix with 4-6 ounces of water   Yes Historical Provider, MD  ranitidine (ZANTAC) 150 MG tablet Take 150 mg by mouth at bedtime.   Yes Historical Provider, MD  sodium chloride (OCEAN) 0.65 % SOLN nasal spray Place 2 sprays into both nostrils 2 (two) times daily.   Yes Historical Provider, MD  venlafaxine (EFFEXOR) 37.5 MG tablet Take 37.5 mg by mouth every morning.    Yes Historical Provider, MD  White Petrolatum-Mineral Oil (GENTEAL PM) 85-15 % OINT Place 1 application into both eyes 4 (four) times daily.   Yes Historical Provider, MD  nystatin cream (MYCOSTATIN) Apply to affected area 2 times daily 05/20/14   Einar Gip Dansie, PA-C  sodium chloride irrigation 0.9 % irrigation 1 application irrigation as directed. Clean right forearm skin tears daily until healed.    Historical Provider, MD   BP 120/69 mmHg  Pulse 78  Temp(Src) 97.9 F (36.6 C) (Oral)  Resp 18  SpO2 100% Physical Exam  Constitutional: He appears well-developed.  HENT:  Forehead abrasion/hematoma on left side of forehead. No step-off or deformity.  Eyes: Pupils are equal, round, and reactive to light.  Neck:  No midline tenderness.  Cardiovascular:  irregular rhythm  Pulmonary/Chest: Breath sounds  normal.  Pacemaker left anterior chest wall  Abdominal: There is no tenderness.  Musculoskeletal:  Skin tear to left forearm. Approximately 7 cm long. No bony tenderness. No tenderness over shoulder elbow or wrist. There is also a skin tear on the right shin/tibia. No bony tenderness. No tenderness of her hips knees or ankles.  Skin: Skin is warm.    ED Course  Procedures (including critical care time) Labs Review Labs Reviewed  URINALYSIS, ROUTINE W REFLEX MICROSCOPIC - Abnormal; Notable for the following:    Protein, ur 30 (*)    All other components within normal limits  URINE MICROSCOPIC-ADD ON - Abnormal; Notable for the following:    Squamous Epithelial / LPF FEW (*)  All other components within normal limits    Imaging Review Ct Head Wo Contrast  05/23/2014   CLINICAL DATA:  Witnessed fall from standing position, left frontal hematoma  EXAM: CT HEAD WITHOUT CONTRAST  TECHNIQUE: Contiguous axial images were obtained from the base of the skull through the vertex without intravenous contrast.  COMPARISON:  05/20/2014  FINDINGS: The bony calvarium is intact. A small left frontal hematoma is noted consistent with the recent injury. Prominent fatty tissue is noted emanating from the orbits inferiorly which is stable. Diffuse atrophic changes are again identified. Chronic white matter ischemic change is seen. No findings to suggest acute hemorrhage, acute infarction or space-occupying mass lesion are noted.  IMPRESSION: Chronic atrophic and ischemic changes  Left frontal hematoma consistent with the recent injury.  Prominent fatty tissue adjacent to the orbits which is of uncertain significance and stable from the prior exam.   Electronically Signed   By: Alcide CleverMark  Lukens M.D.   On: 05/23/2014 16:00     EKG Interpretation   Date/Time:  Friday May 23 2014 14:33:47 EST Ventricular Rate:  78 PR Interval:    QRS Duration: 86 QT Interval:  368 QTC Calculation: 419 R Axis:   -33 Text  Interpretation:  Atrial fibrillation LVH with secondary  repolarization abnormality Inferior infarct, old Anterior infarct, old  Confirmed by Akram Kissick  MD, Blanca Carreon [phone]([id number]) on 05/23/2014 3:19:46 PM      MDM   Final diagnoses:  Fall, initial encounter  Skin tear of forearm without complication, left, initial encounter  Traumatic hematoma of forehead, initial encounter   patient with fall. Skin tear. Has hematoma of forehead but negative head CT. Patient would probably benefit from being off his Plavix also. Cardiology was recently discussed this. I have put for him to stop the Plavix, but the family members may have their own opinions on this.      Juliet RudeNathan R. Rubin PayorPickering, MD 05/24/14 [phone]

## 2014-07-01 ENCOUNTER — Emergency Department (HOSPITAL_COMMUNITY): Payer: Medicare Other

## 2014-07-01 ENCOUNTER — Encounter (HOSPITAL_COMMUNITY): Payer: Self-pay | Admitting: Emergency Medicine

## 2014-07-01 ENCOUNTER — Observation Stay (HOSPITAL_COMMUNITY)
Admission: EM | Admit: 2014-07-01 | Discharge: 2014-07-04 | Disposition: A | Discharge status: Skilled Nursing Facility | Payer: Medicare Other | Attending: Internal Medicine | Admitting: Internal Medicine

## 2014-07-01 DIAGNOSIS — I482 Chronic atrial fibrillation: Secondary | ICD-10-CM | POA: Insufficient documentation

## 2014-07-01 DIAGNOSIS — Z87891 Personal history of nicotine dependence: Secondary | ICD-10-CM | POA: Insufficient documentation

## 2014-07-01 DIAGNOSIS — F0390 Unspecified dementia without behavioral disturbance: Secondary | ICD-10-CM | POA: Insufficient documentation

## 2014-07-01 DIAGNOSIS — E119 Type 2 diabetes mellitus without complications: Secondary | ICD-10-CM | POA: Diagnosis not present

## 2014-07-01 DIAGNOSIS — J9600 Acute respiratory failure, unspecified whether with hypoxia or hypercapnia: Secondary | ICD-10-CM | POA: Diagnosis not present

## 2014-07-01 DIAGNOSIS — R296 Repeated falls: Secondary | ICD-10-CM | POA: Diagnosis not present

## 2014-07-01 DIAGNOSIS — E43 Unspecified severe protein-calorie malnutrition: Secondary | ICD-10-CM | POA: Insufficient documentation

## 2014-07-01 DIAGNOSIS — Z950 Presence of cardiac pacemaker: Secondary | ICD-10-CM | POA: Insufficient documentation

## 2014-07-01 DIAGNOSIS — E876 Hypokalemia: Secondary | ICD-10-CM | POA: Insufficient documentation

## 2014-07-01 DIAGNOSIS — Z882 Allergy status to sulfonamides status: Secondary | ICD-10-CM | POA: Insufficient documentation

## 2014-07-01 DIAGNOSIS — Z888 Allergy status to other drugs, medicaments and biological substances status: Secondary | ICD-10-CM | POA: Insufficient documentation

## 2014-07-01 DIAGNOSIS — I951 Orthostatic hypotension: Secondary | ICD-10-CM | POA: Diagnosis not present

## 2014-07-01 DIAGNOSIS — I2510 Atherosclerotic heart disease of native coronary artery without angina pectoris: Secondary | ICD-10-CM | POA: Insufficient documentation

## 2014-07-01 DIAGNOSIS — I129 Hypertensive chronic kidney disease with stage 1 through stage 4 chronic kidney disease, or unspecified chronic kidney disease: Secondary | ICD-10-CM | POA: Diagnosis not present

## 2014-07-01 DIAGNOSIS — G9340 Encephalopathy, unspecified: Secondary | ICD-10-CM | POA: Insufficient documentation

## 2014-07-01 DIAGNOSIS — E782 Mixed hyperlipidemia: Secondary | ICD-10-CM | POA: Diagnosis not present

## 2014-07-01 DIAGNOSIS — Z66 Do not resuscitate: Secondary | ICD-10-CM | POA: Diagnosis not present

## 2014-07-01 DIAGNOSIS — R55 Syncope and collapse: Secondary | ICD-10-CM | POA: Diagnosis present

## 2014-07-01 DIAGNOSIS — I509 Heart failure, unspecified: Secondary | ICD-10-CM | POA: Insufficient documentation

## 2014-07-01 DIAGNOSIS — I5022 Chronic systolic (congestive) heart failure: Principal | ICD-10-CM | POA: Insufficient documentation

## 2014-07-01 DIAGNOSIS — I4891 Unspecified atrial fibrillation: Secondary | ICD-10-CM | POA: Insufficient documentation

## 2014-07-01 DIAGNOSIS — N183 Chronic kidney disease, stage 3 (moderate): Secondary | ICD-10-CM | POA: Insufficient documentation

## 2014-07-01 DIAGNOSIS — I495 Sick sinus syndrome: Secondary | ICD-10-CM | POA: Diagnosis not present

## 2014-07-01 LAB — PROTIME-INR
INR: 1.17 (ref 0.00–1.49)
PROTHROMBIN TIME: 15 s (ref 11.6–15.2)

## 2014-07-01 LAB — CBC WITH DIFFERENTIAL/PLATELET
Basophils Absolute: 0 10*3/uL (ref 0.0–0.1)
Basophils Relative: 0 % (ref 0–1)
Eosinophils Absolute: 0.1 10*3/uL (ref 0.0–0.7)
Eosinophils Relative: 1 % (ref 0–5)
HCT: 39.4 % (ref 39.0–52.0)
Hemoglobin: 12.8 g/dL — ABNORMAL LOW (ref 13.0–17.0)
Lymphocytes Relative: 12 % (ref 12–46)
Lymphs Abs: 0.6 10*3/uL — ABNORMAL LOW (ref 0.7–4.0)
MCH: 28.8 pg (ref 26.0–34.0)
MCHC: 32.5 g/dL (ref 30.0–36.0)
MCV: 88.5 fL (ref 78.0–100.0)
MONO ABS: 0.5 10*3/uL (ref 0.1–1.0)
Monocytes Relative: 11 % (ref 3–12)
NEUTROS ABS: 3.6 10*3/uL (ref 1.7–7.7)
Neutrophils Relative %: 76 % (ref 43–77)
Platelets: 197 10*3/uL (ref 150–400)
RBC: 4.45 MIL/uL (ref 4.22–5.81)
RDW: 14.8 % (ref 11.5–15.5)
WBC: 4.8 10*3/uL (ref 4.0–10.5)

## 2014-07-01 LAB — BRAIN NATRIURETIC PEPTIDE: B NATRIURETIC PEPTIDE 5: 1697 pg/mL — AB (ref 0.0–100.0)

## 2014-07-01 LAB — BASIC METABOLIC PANEL
ANION GAP: 11 (ref 5–15)
BUN: 24 mg/dL — ABNORMAL HIGH (ref 6–23)
CO2: 25 mmol/L (ref 19–32)
Calcium: 8.6 mg/dL (ref 8.4–10.5)
Chloride: 103 mmol/L (ref 96–112)
Creatinine, Ser: 1.34 mg/dL (ref 0.50–1.35)
GFR calc Af Amer: 52 mL/min — ABNORMAL LOW (ref >90)
GFR calc non Af Amer: 45 mL/min — ABNORMAL LOW (ref >90)
Glucose, Bld: 148 mg/dL — ABNORMAL HIGH (ref 70–99)
Potassium: 3.6 mmol/L (ref 3.5–5.1)
SODIUM: 139 mmol/L (ref 135–145)

## 2014-07-01 LAB — DIGOXIN LEVEL: DIGOXIN LVL: 1.7 ng/mL (ref 0.8–2.0)

## 2014-07-01 LAB — TROPONIN I: Troponin I: <0.03 ng/mL (ref <0.031)

## 2014-07-01 MED ORDER — FUROSEMIDE 10 MG/ML IJ SOLN
40.0000 mg | Freq: Once | INTRAMUSCULAR | Status: AC
  Administered 2014-07-01: 40 mg via INTRAVENOUS
  Filled 2014-07-01: qty 4

## 2014-07-01 NOTE — ED Notes (Signed)
Per [facility], stood up and then passed out, staff said he was unresponsive for 5-10 minutes. Pt is normally alert to self, hx of dementia. Pt has afib on monitor, with pacer that occaisionally captures. Pt responds to verbal stimuli.

## 2014-07-01 NOTE — ED Provider Notes (Signed)
CSN: [id number]     Arrival date & time 07/01/14  2010 History   First MD Initiated Contact with Patient 07/01/14 2018     Chief Complaint  Patient presents with  . Loss of Consciousness     (Consider location/radiation/quality/duration/timing/severity/associated sxs/prior Treatment) HPI Comments: Patient from ECF.  Level 5 caveat for dementia.  Apparent syncopal episode upon standing from sitting position.  Unresponsive for 5-10 minutes. No seizure activity.  Hx pace Cytogeneticistmaker.  Oriented x1.  Denies pain. No headache or chest pain.  The history is provided by the patient and the EMS personnel. The history is limited by the condition of the patient.    Past Medical History  Diagnosis Date  . Cardiac pacemaker in situ     s/p Viatron DDD Pacemaker for sinus node dysfunction  . Orthostatic hypotension   . Hyperlipidemia     mixed  . Atrial fibrillation     not felt to be a coumadin candidate due to falls  . Sinoatrial node dysfunction     s/p PPM (MDT)  . Coronary atherosclerosis of native coronary artery     s/p AWMI 2005 stent LAD  . Diabetes mellitus, type 2   . Chronic renal insufficiency, stage II (mild)   . Intolerance, drug     amniodarone  . Upper GI hemorrhage     2006 Mallory Weiss Tear  . MI (myocardial infarction)     coronary artery status post anterior wall myocardial infarction in 2005 treated with a stent to the left anterior descending.  . Dizziness     recurrent dizziness and falls  . Diabetes mellitus   . Hyperlipidemia   . Renal insufficiency     mild  . Dementia   . Depression   . CHF (congestive heart failure)   . GERD (gastroesophageal reflux disease)    Past Surgical History  Procedure Laterality Date  . Pacemaker placement    . Coronary stent placement     Family History  Problem Relation Age of Onset  . Coronary artery disease Brother   . Diabetes Mother    History  Substance Use Topics  . Smoking status: Former Smoker    Quit date:  03/28/1960  . Smokeless tobacco: Not on file  . Alcohol Use: No    Review of Systems  Unable to perform ROS: Dementia  Cardiovascular: Positive for syncope.      Allergies  Amiodarone hcl and Sulfonamide derivatives  Home Medications   Prior to Admission medications   Medication Sig Start Date End Date Taking? Authorizing Provider  atorvastatin (LIPITOR) 10 MG tablet Take 10 mg by mouth every morning.    Yes Historical Provider, MD  beta carotene w/minerals (OCUVITE) tablet Take 1 tablet by mouth daily.   Yes Historical Provider, MD  clopidogrel (PLAVIX) 75 MG tablet Take 75 mg by mouth daily.   Yes Historical Provider, MD  Cranberry 500 MG CAPS Take 500 mg by mouth 2 (two) times daily.   Yes Historical Provider, MD  dextromethorphan (DELSYM) 30 MG/5ML liquid Take 5 mLs by mouth 2 (two) times daily as needed for cough.    Yes Historical Provider, MD  digoxin (LANOXIN) 0.25 MG tablet Take 250 mcg by mouth every morning. *HOLD IF HEART RATE IS LESS THAN 60*   Yes Historical Provider, MD  Emollient (MINERIN) LOTN Apply 1 application topically at bedtime. Applies to feet   Yes Historical Provider, MD  fludrocortisone (FLORINEF) 0.1 MG tablet Take 0.1 mg by mouth  2 (two) times daily.    Yes Historical Provider, MD  furosemide (LASIX) 20 MG tablet Take 20 mg by mouth every morning.    Yes Historical Provider, MD  latanoprost (XALATAN) 0.005 % ophthalmic solution Place 1 drop into both eyes every evening. At 8 pm   Yes Historical Provider, MD  metoprolol tartrate (LOPRESSOR) 25 MG tablet Take 25 mg by mouth 2 (two) times daily.   Yes Historical Provider, MD  Multiple Vitamins-Minerals (THEREMS M PO) Take 1 tablet by mouth every morning.   Yes Historical Provider, MD  neomycin-bacitracin-polymyxin (NEOSPORIN) [phone] ointment Apply 1 application topically as directed. 1 application daily to right forearm skin tears until healed   Yes Historical Provider, MD  nystatin-triamcinolone (MYCOLOG  II) cream Apply 1 application topically 2 (two) times daily. To inner thighs   Yes Historical Provider, MD  Polyethyl Glycol-Propyl Glycol (SYSTANE ULTRA) 0.4-0.3 % SOLN Place 1 drop into both eyes 4 (four) times daily.   Yes Historical Provider, MD  potassium chloride (KLOR-CON) 20 MEQ packet Take 20 mEq by mouth every morning. Mix with 4-6 ounces of water   Yes Historical Provider, MD  ranitidine (ZANTAC) 150 MG tablet Take 150 mg by mouth at bedtime.   Yes Historical Provider, MD  sodium chloride (OCEAN) 0.65 % SOLN nasal spray Place 2 sprays into both nostrils 2 (two) times daily.   Yes Historical Provider, MD  venlafaxine (EFFEXOR) 37.5 MG tablet Take 37.5 mg by mouth every morning.    Yes Historical Provider, MD  brimonidine (ALPHAGAN) 0.2 % ophthalmic solution Place 1 drop into both eyes 2 (two) times daily. Wait 3-5 minutes between each drop    Historical Provider, MD  nystatin cream (MYCOSTATIN) Apply to affected area 2 times daily Patient taking differently: Apply 1 application topically 2 (two) times daily. Apply to affected area 2 times daily 05/20/14   Everlene Farrier, PA-C   BP 147/90 mmHg  Pulse 87  Temp(Src) 96 F (35.6 C) (Rectal)  Resp 20  SpO2 96% Physical Exam  Constitutional: He appears well-developed and well-nourished. No distress.  Cachectic, dry mucous membranes  HENT:  Head: Normocephalic and atraumatic.  Mouth/Throat: Oropharynx is clear and moist. No oropharyngeal exudate.  Eyes: Conjunctivae are normal. Pupils are equal, round, and reactive to light.  APD on L  Neck: Normal range of motion. Neck supple.  No C spine tenderness  Cardiovascular: Normal rate and normal heart sounds.   No murmur heard. Pulmonary/Chest: Effort normal. No respiratory distress. He has rales.  Rhonchi bilaterally.  Abdominal: Soft. There is no tenderness. There is no rebound and no guarding.  Musculoskeletal: Normal range of motion. He exhibits no edema or tenderness.  Neurological:  He is alert.  Moving all extremities  Skin: Skin is warm.    ED Course  Procedures (including critical care time) Labs Review Labs Reviewed  CBC WITH DIFFERENTIAL/PLATELET - Abnormal; Notable for the following:    Hemoglobin 12.8 (*)    Lymphs Abs 0.6 (*)    All other components within normal limits  BASIC METABOLIC PANEL - Abnormal; Notable for the following:    Glucose, Bld 148 (*)    BUN 24 (*)    GFR calc non Af Amer 45 (*)    GFR calc Af Amer 52 (*)    All other components within normal limits  URINALYSIS, ROUTINE W REFLEX MICROSCOPIC - Abnormal; Notable for the following:    Hgb urine dipstick TRACE (*)    All other components within  normal limits  BRAIN NATRIURETIC PEPTIDE - Abnormal; Notable for the following:    B Natriuretic Peptide 1697.0 (*)    All other components within normal limits  TROPONIN I  PROTIME-INR  DIGOXIN LEVEL  URINE MICROSCOPIC-ADD ON  CBC  CREATININE, SERUM  CBC WITH DIFFERENTIAL/PLATELET  COMPREHENSIVE METABOLIC PANEL    Imaging Review Ct Head Wo Contrast  07/01/2014   CLINICAL DATA:  Patient stood up and passed out. Unresponsive for 5-10 minutes. Loss of consciousness.  EXAM: CT HEAD WITHOUT CONTRAST  CT CERVICAL SPINE WITHOUT CONTRAST  TECHNIQUE: Multidetector CT imaging of the head and cervical spine was performed following the standard protocol without intravenous contrast. Multiplanar CT image reconstructions of the cervical spine were also generated.  COMPARISON:  CT head 05/23/2014. CT head and cervical spine 05/20/2014.  FINDINGS: CT HEAD FINDINGS  Diffuse cerebral atrophy. Ventricular dilatation consistent with central atrophy. Low-attenuation changes in the deep white matter consistent with small vessel ischemia. Old lacunar infarcts in the deep white matter bilaterally. No mass effect or midline shift. No abnormal extra-axial fluid collections. Gray-white matter junctions are distinct. Basal cisterns are not effaced. No evidence of acute  intracranial hemorrhage. No depressed skull fractures. Visualized paranasal sinuses and mastoid air cells are not opacified.  CT CERVICAL SPINE FINDINGS  Examination is technically limited due to motion artifact despite repeat imaging. Retrolisthesis of C3 on C4. Slight anterolisthesis of C4 on C5 and C7 on T1. Alignment is unchanged since prior study. Degenerative changes throughout the cervical spine with narrowed cervical interspaces and endplate hypertrophic changes. No vertebral compression deformities. No prevertebral soft tissue swelling. Posterior elements appear intact. No focal bone lesion or bone destruction is appreciated. Soft tissues are unremarkable. Incidental note of large bilateral pleural effusions.  IMPRESSION: No acute intracranial abnormalities. Chronic atrophy and small vessel ischemic changes.  Cervical spine is limited due to motion artifact but alignment appears unchanged since previous study and no acute displaced fractures identified. Diffuse degenerative change. Large bilateral pleural effusions.   Electronically Signed   By: Burman Nieves M.D.   On: 07/01/2014 23:07   Ct Cervical Spine Wo Contrast  07/01/2014   CLINICAL DATA:  Patient stood up and passed out. Unresponsive for 5-10 minutes. Loss of consciousness.  EXAM: CT HEAD WITHOUT CONTRAST  CT CERVICAL SPINE WITHOUT CONTRAST  TECHNIQUE: Multidetector CT imaging of the head and cervical spine was performed following the standard protocol without intravenous contrast. Multiplanar CT image reconstructions of the cervical spine were also generated.  COMPARISON:  CT head 05/23/2014. CT head and cervical spine 05/20/2014.  FINDINGS: CT HEAD FINDINGS  Diffuse cerebral atrophy. Ventricular dilatation consistent with central atrophy. Low-attenuation changes in the deep white matter consistent with small vessel ischemia. Old lacunar infarcts in the deep white matter bilaterally. No mass effect or midline shift. No abnormal extra-axial  fluid collections. Gray-white matter junctions are distinct. Basal cisterns are not effaced. No evidence of acute intracranial hemorrhage. No depressed skull fractures. Visualized paranasal sinuses and mastoid air cells are not opacified.  CT CERVICAL SPINE FINDINGS  Examination is technically limited due to motion artifact despite repeat imaging. Retrolisthesis of C3 on C4. Slight anterolisthesis of C4 on C5 and C7 on T1. Alignment is unchanged since prior study. Degenerative changes throughout the cervical spine with narrowed cervical interspaces and endplate hypertrophic changes. No vertebral compression deformities. No prevertebral soft tissue swelling. Posterior elements appear intact. No focal bone lesion or bone destruction is appreciated. Soft tissues are unremarkable. Incidental note of  large bilateral pleural effusions.  IMPRESSION: No acute intracranial abnormalities. Chronic atrophy and small vessel ischemic changes.  Cervical spine is limited due to motion artifact but alignment appears unchanged since previous study and no acute displaced fractures identified. Diffuse degenerative change. Large bilateral pleural effusions.   Electronically Signed   By: Burman Nieves M.D.   On: 07/01/2014 23:07   Dg Chest Portable 1 View  07/01/2014   CLINICAL DATA:  Altered mental status.  EXAM: PORTABLE CHEST - 1 VIEW  COMPARISON:  05/01/2014.  FINDINGS: The cardiac silhouette remains mildly enlarged. Interval diffuse prominence of the pulmonary vasculature and interstitial markings with an interval moderate-sized left pleural effusion and small right pleural effusion. Stable left subclavian pacemaker leads. Diffuse osteopenia.  IMPRESSION: Interval changes of acute congestive heart failure with a moderate-sized left pleural effusion and small right pleural effusion.   Electronically Signed   By: Beckie Salts M.D.   On: 07/01/2014 20:55     EKG Interpretation   Date/Time:  Tuesday July 01 2014 20:25:13  EDT Ventricular Rate:  95 PR Interval:  28 QRS Duration: 92 QT Interval:  366 QTC Calculation: 460 R Axis:   -30 Text Interpretation:  Atrial fibrillation LVH with secondary  repolarization abnormality Inferior infarct, old Anterior infarct, old  Confirmed by OTTER  MD, OLGA ([id number]) on 07/02/2014 12:02:27 AM      MDM   Final diagnoses:  CHF exacerbation  Syncope, unspecified syncope type   Patient with syncopal episode and from standing from a sitting position. Apparently unresponsive for 5-10 minutes. Patient oriented to self only.  Atrial fibrillation on EKG. Chest x-ray with CHF and pleural effusions.  Patient given IV Lasix. He is not in any respiratory distress and has no O2 requirement. CT head and C-spine negative. Pacemaker interrogated without any evidence of arrhythmias.  BNP elevated. Gentle diuresis. LIkely vasovagal syncope. NO arrhythmias on pacer.  New pleural effusion and pulmonary edema. Will admit for diuresis. D/w Dr. Alvester Morin.  Glynn Octave, MD 07/02/14 [phone]

## 2014-07-01 NOTE — ED Notes (Signed)
Daughter, Marylene BuergerSandra Presnell, cell phone [phone]

## 2014-07-01 NOTE — ED Notes (Signed)
Warm blankets applied

## 2014-07-02 DIAGNOSIS — R55 Syncope and collapse: Secondary | ICD-10-CM | POA: Diagnosis present

## 2014-07-02 DIAGNOSIS — I509 Heart failure, unspecified: Secondary | ICD-10-CM | POA: Insufficient documentation

## 2014-07-02 LAB — CBC WITH DIFFERENTIAL/PLATELET
BASOS PCT: 0 % (ref 0–1)
Basophils Absolute: 0 10*3/uL (ref 0.0–0.1)
Eosinophils Absolute: 0.1 10*3/uL (ref 0.0–0.7)
Eosinophils Relative: 1 % (ref 0–5)
HCT: 38.9 % — ABNORMAL LOW (ref 39.0–52.0)
HEMOGLOBIN: 12.5 g/dL — AB (ref 13.0–17.0)
Lymphocytes Relative: 14 % (ref 12–46)
Lymphs Abs: 0.7 10*3/uL (ref 0.7–4.0)
MCH: 28.7 pg (ref 26.0–34.0)
MCHC: 32.1 g/dL (ref 30.0–36.0)
MCV: 89.4 fL (ref 78.0–100.0)
Monocytes Absolute: 0.8 10*3/uL (ref 0.1–1.0)
Monocytes Relative: 16 % — ABNORMAL HIGH (ref 3–12)
NEUTROS ABS: 3.1 10*3/uL (ref 1.7–7.7)
Neutrophils Relative %: 69 % (ref 43–77)
Platelets: 206 10*3/uL (ref 150–400)
RBC: 4.35 MIL/uL (ref 4.22–5.81)
RDW: 14.8 % (ref 11.5–15.5)
WBC: 4.6 10*3/uL (ref 4.0–10.5)

## 2014-07-02 LAB — GLUCOSE, CAPILLARY: GLUCOSE-CAPILLARY: 82 mg/dL (ref 70–99)

## 2014-07-02 LAB — URINALYSIS, ROUTINE W REFLEX MICROSCOPIC
BILIRUBIN URINE: NEGATIVE
Glucose, UA: NEGATIVE mg/dL
KETONES UR: NEGATIVE mg/dL
LEUKOCYTES UA: NEGATIVE
NITRITE: NEGATIVE
PROTEIN: NEGATIVE mg/dL
Specific Gravity, Urine: 1.006 (ref 1.005–1.030)
Urobilinogen, UA: 0.2 mg/dL (ref 0.0–1.0)
pH: 7.5 (ref 5.0–8.0)

## 2014-07-02 LAB — URINE MICROSCOPIC-ADD ON

## 2014-07-02 LAB — MRSA PCR SCREENING: MRSA by PCR: NEGATIVE

## 2014-07-02 LAB — COMPREHENSIVE METABOLIC PANEL
ALT: 26 U/L (ref 0–53)
ANION GAP: 11 (ref 5–15)
AST: 34 U/L (ref 0–37)
Albumin: 3.2 g/dL — ABNORMAL LOW (ref 3.5–5.2)
Alkaline Phosphatase: 111 U/L (ref 39–117)
BUN: 20 mg/dL (ref 6–23)
CO2: 29 mmol/L (ref 19–32)
Calcium: 8.6 mg/dL (ref 8.4–10.5)
Chloride: 103 mmol/L (ref 96–112)
Creatinine, Ser: 1.36 mg/dL — ABNORMAL HIGH (ref 0.50–1.35)
GFR calc Af Amer: 51 mL/min — ABNORMAL LOW (ref >90)
GFR calc non Af Amer: 44 mL/min — ABNORMAL LOW (ref >90)
Glucose, Bld: 103 mg/dL — ABNORMAL HIGH (ref 70–99)
Potassium: 3.2 mmol/L — ABNORMAL LOW (ref 3.5–5.1)
Sodium: 143 mmol/L (ref 135–145)
Total Bilirubin: 1.6 mg/dL — ABNORMAL HIGH (ref 0.3–1.2)
Total Protein: 6.9 g/dL (ref 6.0–8.3)

## 2014-07-02 MED ORDER — HEPARIN SODIUM (PORCINE) 5000 UNIT/ML IJ SOLN
5000.0000 [IU] | Freq: Three times a day (TID) | INTRAMUSCULAR | Status: DC
  Administered 2014-07-02 – 2014-07-04 (×8): 5000 [IU] via SUBCUTANEOUS
  Filled 2014-07-02 (×9): qty 1

## 2014-07-02 MED ORDER — VENLAFAXINE HCL 37.5 MG PO TABS
37.5000 mg | ORAL_TABLET | Freq: Every day | ORAL | Status: DC
  Administered 2014-07-02 – 2014-07-04 (×3): 37.5 mg via ORAL
  Filled 2014-07-02 (×4): qty 1

## 2014-07-02 MED ORDER — FLUDROCORTISONE ACETATE 0.1 MG PO TABS
0.1000 mg | ORAL_TABLET | Freq: Two times a day (BID) | ORAL | Status: DC
  Administered 2014-07-02 – 2014-07-04 (×5): 0.1 mg via ORAL
  Filled 2014-07-02 (×7): qty 1

## 2014-07-02 MED ORDER — ATORVASTATIN CALCIUM 10 MG PO TABS
10.0000 mg | ORAL_TABLET | ORAL | Status: DC
Start: 2014-07-02 — End: 2014-07-04
  Administered 2014-07-02 – 2014-07-04 (×3): 10 mg via ORAL
  Filled 2014-07-02 (×4): qty 1

## 2014-07-02 MED ORDER — POTASSIUM CHLORIDE 20 MEQ PO PACK
20.0000 meq | PACK | ORAL | Status: DC
  Filled 2014-07-02: qty 1

## 2014-07-02 MED ORDER — DEXTROMETHORPHAN POLISTIREX 30 MG/5ML PO LQCR
5.0000 mL | Freq: Two times a day (BID) | ORAL | Status: DC | PRN
  Filled 2014-07-02: qty 5

## 2014-07-02 MED ORDER — POLYETHYL GLYCOL-PROPYL GLYCOL 0.4-0.3 % OP SOLN: 1.0000 [drp] | Freq: Four times a day (QID) | OPHTHALMIC | Status: DC

## 2014-07-02 MED ORDER — LATANOPROST 0.005 % OP SOLN
1.0000 [drp] | Freq: Every evening | OPHTHALMIC | Status: DC
  Administered 2014-07-02 – 2014-07-03 (×2): 1 [drp] via OPHTHALMIC
  Filled 2014-07-02: qty 2.5

## 2014-07-02 MED ORDER — BRIMONIDINE TARTRATE 0.2 % OP SOLN: 1.0000 [drp] | Freq: Two times a day (BID) | OPHTHALMIC | Status: DC

## 2014-07-02 MED ORDER — FAMOTIDINE 20 MG PO TABS
20.0000 mg | ORAL_TABLET | Freq: Every day | ORAL | Status: DC
  Administered 2014-07-02 – 2014-07-04 (×3): 20 mg via ORAL
  Filled 2014-07-02 (×3): qty 1

## 2014-07-02 MED ORDER — POTASSIUM CHLORIDE 10 MEQ/100ML IV SOLN
10.0000 meq | INTRAVENOUS | Status: AC
  Administered 2014-07-02 (×4): 10 meq via INTRAVENOUS
  Filled 2014-07-02 (×4): qty 100

## 2014-07-02 MED ORDER — DIGOXIN 250 MCG PO TABS
250.0000 ug | ORAL_TABLET | Freq: Every day | ORAL | Status: DC
  Administered 2014-07-02 – 2014-07-04 (×3): 250 ug via ORAL
  Filled 2014-07-02 (×3): qty 1

## 2014-07-02 MED ORDER — METOPROLOL TARTRATE 25 MG PO TABS
25.0000 mg | ORAL_TABLET | Freq: Two times a day (BID) | ORAL | Status: DC
  Administered 2014-07-02 – 2014-07-04 (×4): 25 mg via ORAL
  Filled 2014-07-02 (×6): qty 1

## 2014-07-02 MED ORDER — POLYVINYL ALCOHOL 1.4 % OP SOLN
1.0000 [drp] | Freq: Four times a day (QID) | OPHTHALMIC | Status: DC | PRN
  Filled 2014-07-02: qty 15

## 2014-07-02 MED ORDER — DIGOXIN 250 MCG PO TABS
250.0000 ug | ORAL_TABLET | ORAL | Status: DC
  Filled 2014-07-02 (×2): qty 1

## 2014-07-02 MED ORDER — FUROSEMIDE 20 MG PO TABS
20.0000 mg | ORAL_TABLET | Freq: Every day | ORAL | Status: DC
  Administered 2014-07-02 – 2014-07-03 (×2): 20 mg via ORAL
  Filled 2014-07-02 (×2): qty 1

## 2014-07-02 MED ORDER — NYSTATIN-TRIAMCINOLONE 100000-0.1 UNIT/GM-% EX CREA
1.0000 "application " | TOPICAL_CREAM | Freq: Two times a day (BID) | CUTANEOUS | Status: DC
  Administered 2014-07-03 – 2014-07-04 (×3): 1 via TOPICAL
  Filled 2014-07-02 (×2): qty 15

## 2014-07-02 MED ORDER — CLOPIDOGREL BISULFATE 75 MG PO TABS
75.0000 mg | ORAL_TABLET | Freq: Every day | ORAL | Status: DC
  Administered 2014-07-02 – 2014-07-04 (×3): 75 mg via ORAL
  Filled 2014-07-02 (×3): qty 1

## 2014-07-02 MED ORDER — SODIUM CHLORIDE 0.9 % IJ SOLN
3.0000 mL | Freq: Two times a day (BID) | INTRAMUSCULAR | Status: DC
  Administered 2014-07-02 – 2014-07-04 (×5): 3 mL via INTRAVENOUS

## 2014-07-02 MED ORDER — POTASSIUM CHLORIDE CRYS ER 20 MEQ PO TBCR
20.0000 meq | EXTENDED_RELEASE_TABLET | Freq: Every day | ORAL | Status: DC
  Administered 2014-07-03: 20 meq via ORAL
  Filled 2014-07-02 (×3): qty 1

## 2014-07-02 MED ORDER — HYDRALAZINE HCL 20 MG/ML IJ SOLN
5.0000 mg | INTRAMUSCULAR | Status: DC | PRN
  Administered 2014-07-02: 5 mg via INTRAVENOUS
  Filled 2014-07-02: qty 1

## 2014-07-02 NOTE — ED Notes (Signed)
Phone call to daughter, Marylene BuergerSandra Presnell, patient is a DNR.

## 2014-07-02 NOTE — Progress Notes (Signed)
Spoke with Daughter Dois DavenportSandra who stated "pt has DNR papers" and [facility]ard Hal[facility]Randy Chaplain [facility] who also stated "pt is DNR on their records". I notified Dr. Lenise ArenaMeyers of pt wishes and to advise on orders.

## 2014-07-02 NOTE — Progress Notes (Signed)
Patient ID: [name] LenisHoward H Attaway, male   DOB: [birth year], 81 y.o.   MRN: [id number]  [facility] PROGRESS NOTE  [name] [name] FIE:[id number] DOB: [birth year] DOA: 07/01/2014 PCP: Florentina JennyRIPP, HENRY, MD   Brief narrative:    Pt admitted after midnight. This is the addendum to Dr. [place] BlasNewton's admission note.  Pt is 81 yo male with atrial fib, systolic CHF, s/p pacemaker dementia, [facility] with AMS and syncopal event. Please note that pt unable to provide any history at the time of the admission due to AMS and dementia, baseline confusion. Family not at the bedside at the time of the admission.   In the ED, pt noted to have HR 40 - 90's,, RR in mid 20's. CXR worrisome for developing pulmonary vascular congestion. [facility] asked to admit for further evaluation. Pacemaker interrogated at bedside w/ no significant findings per EDP. EKG with atrial fibrillation.   Assessment/Plan:    Active Problems: Acute encephalopathy - appears to be multifactorial and secondary to acute systolic CHF, imposed on known dementia and progressive FTT - UA with no signs of UTI - once more medically stable will need PT/OT/SLP evaluation  - monitor electrolytes  Acute respiratory failure with hypoxia secondary to acute systolic CHF - given lasix in ED, weight is 114 lbs on admission - continue to monitor daily weights, strict I's and O's - place foley due to difficulty with voiding  - 2 D ECHO pending  - provide oxygen via [place] Hypokalemia - from Lasix - supplement and repeat BMP in AM Acute renal failure  - secondary to Lasix  - very gentle diuresis - monitor I/O and repeat BMP in AM Atrial fibrillation  - rate controlled currently - pacemaker stable per EDP report - cont digoxin  - hold BB given noted bradycardia in setting of syncope - not on anticoagulation due to high risk fall and dementia  HTN, accelerated  - continue home regimen with lasix and metoprolol - added hydralazine as needed   Underweight, PCM severe - Body mass index is 16.86 kg/(m^2). - nutritionist consulted - SLP requested   DVT prophylaxis - Heparin SQ  Code Status: DNR Family Communication:  No family at bedside Disposition Plan: Requires continued hospitalization for CHF management.   IV access:  Peripheral IV  Procedures and diagnostic studies:     Ct Cervical Spine Wo Contrast  07/01/2014  No acute intracranial abnormalities. Chronic atrophy and small vessel ischemic changes.  Cervical spine is limited due to motion artifact but alignment appears unchanged since previous study and no acute displaced fractures identified. Diffuse degenerative change. Large bilateral pleural effusions.     Dg Chest Portable 1 View  07/01/2014  Interval changes of acute congestive heart failure with a moderate-sized left pleural effusion and small right pleural effusion.     Medical Consultants:  None   Other Consultants:  None   IAnti-Infectives:   None  Debbora PrestoMAGICK-Evert Wenrich, MD  [facility] Pager [phone]  If 7PM-7AM, please contact night-coverage [email or web address] Password [email or web address] 07/02/2014, 4:35 PM  HPI/Subjective: No events overnight.   Objective: Filed Vitals:   07/02/14 0613 07/02/14 1011 07/02/14 1141 07/02/14 1416  BP: 148/80 163/81 132/55 136/73  Pulse: 82 72 70 70  Temp: 97.6 F (36.4 C)   97.7 F (36.5 C)  TempSrc: Axillary   Oral  Resp: 20 18  20   Weight:      SpO2: 85% 99%      Intake/Output Summary (Last 24 hours) at 07/02/14 1635 Last  data filed at 07/02/14 1340  Gross per 24 hour  Intake    460 ml  Output    725 ml  Net   -265 ml    Exam:   General:  Pt is sleeping, easy to awake, confused but able to follow some commands   Cardiovascular: Irregular rate and rhythm, no rubs, no gallops  Respiratory: Clear to auscultation bilaterally, mild crackles at bases   Abdomen: Soft, non tender, non distended, bowel sounds present, no guarding  Extremities:  pulses DP and PT palpable  bilaterally  Data Reviewed: Basic Metabolic Panel:  Recent Labs Lab 07/01/14 2100 07/02/14 0530  NA 139 143  K 3.6 3.2*  CL 103 103  CO2 25 29  GLUCOSE 148* 103*  BUN 24* 20  CREATININE 1.34 1.36*  CALCIUM 8.6 8.6   Liver Function Tests:  Recent Labs Lab 07/02/14 0530  AST 34  ALT 26  ALKPHOS 111  BILITOT 1.6*  PROT 6.9  ALBUMIN 3.2*   CBC:  Recent Labs Lab 07/01/14 2100 07/02/14 0530  WBC 4.8 4.6  NEUTROABS 3.6 3.1  HGB 12.8* 12.5*  HCT 39.4 38.9*  MCV 88.5 89.4  PLT 197 206   Cardiac Enzymes:  Recent Labs Lab 07/01/14 2100  TROPONINI <0.03   CBG:  Recent Labs Lab 07/02/14 0612  GLUCAP 82   Recent Results (from the past 240 hour(s))  MRSA PCR Screening     Status: None   Collection Time: 07/02/14  5:44 AM  Result Value Ref Range Status   MRSA by PCR NEGATIVE NEGATIVE Final    Scheduled Meds: . clopidogrel  75 mg Oral Daily  . digoxin  250 mcg Oral Daily  . famotidine  20 mg Oral Daily  . fludrocortisone  0.1 mg Oral BID WC  . heparin  5,000 Units Subcutaneous 3 times per day  . sodium chloride  3 mL Intravenous Q12H  . venlafaxine  37.5 mg Oral Q breakfast   Continuous Infusions:

## 2014-07-02 NOTE — H&P (Signed)
Hospitalist Admission History and Physical  Patient name: [name] [name] Medical record number: [id number] Date of birth: 1922-05-18 Age: 81 y.o. Gender: male  Primary Care Provider: Florentina Jenny, MD  Chief Complaint: syncope, CHF  Cards: Allred   History of Present Illness:This is a 81 y.o. year old male with significant past medical history of atrial fibrillation, systolic CHF s/p pacemaker, orthostatic hypotension, CAD, dementia presenting with syncope, CHF. Level V caveat as pt confused at baseline. No family at bedside. Per report, pt w/ witnessed fall at SNF after attempting to stand up. Patient had a prolonged episode of unconsciousness for approximately 5-10 minutes. Per report, patient normally alert to self in setting of baseline dementia. No reports of recent medication changes. Presented to the ER Tmax 96, heart rate in the 40s to 90s but mainly in 70s, respirations and intense 20s, blood pressure in the 130s to 180s, satting 97% on room air. White blood cell count 4.8, hemoglobin 12.8, creatinine 1.34. Troponin negative 1. Digoxin level I.7. BNP is 1700. Head and C-spine CT negative for acute abnormalities. Chest x-ray with interval changes of acute CHF with moderate size left pleural effusion and small right effusion. Pacemaker interrogated at bedside w/ no significant findings per EDP. EKG atrial fibrillation.   Assessment and Plan: [name] [name] is a 81 y.o. year old male presenting with syncope, CHF Active Problems:   Syncope   1-Syncope -suspect orthostatic/vasovagal prodrome-noted to be on fluorinef chronically -cachectic and frail  -baseline symptomatic bradycardia  -noted mild volume overload clinically  -MRI and 2D ECHO per protocol -intermittent episodes of marked bradycardia  -hold BB overnight in setting of diuresis -consider increasing fluorinef   2-CHF -euvolemic to mildly volume overloaded -frail at baseline-limited reserve -no noted recent 2D ECHO  for EF and contractility data -s/p pacemaker placement w/ concominant arrythmia -gentle diuresis -strict Is and Os and daily weights   3- Atrial fibrillation -rate controlled currently -pacemaker stable per EDP report -cont dig  -hold BB given noted bradycardia in setting of syncope -cards c/s as clinically indicated  -not anitcoagulation  4- Hypothermia -noted t 96 on presentation -likely secondary to body habitus -no overt signs of infection -temp improving w/ blankets  -follow  FEN/GI: NPO for now  Prophylaxis: sub q hepari n Disposition: pending further evaluation  Code Status:DNR   Patient Active Problem List   Diagnosis Date Noted  . Syncope 07/02/2014  . Hypothermia 12/28/2013  . Sepsis 12/28/2013  . UTI (lower urinary tract infection) 12/28/2013  . Chronic atrial fibrillation 03/27/2013  . Hypoglycemia 03/27/2013  . CKD (chronic kidney disease) stage 3, GFR 30-59 ml/min 03/27/2013  . DM2 (diabetes mellitus, type 2) 03/27/2013  . Chronic systolic CHF (congestive heart failure) 03/27/2013  . Altered mental status 03/07/2012  . Urinary tract infection 03/07/2012  . Forehead laceration 03/07/2012  . Multiple falls 03/07/2012  . Cardiomyopathy, ischemic 07/01/2010  . CHEST PAIN-UNSPECIFIED 06/19/2009  . DIABETES MELLITUS-TYPE II 06/02/2009  . DEPRESSION 06/02/2009  . CORONARY ARTERY DISEASE 06/02/2009  . ESOPHAGEAL STRICTURE 06/02/2009  . GERD 06/02/2009  . RENAL INSUFFICIENCY 06/02/2009  . WEIGHT LOSS-ABNORMAL 06/02/2009  . SYSTOLIC HEART FAILURE, CHRONIC 05/22/2009  . GAIT DISTURBANCE 01/29/2009  . PEPTIC STRICTURE 07/24/2008  . DYSPHAGIA 05/14/2008  . HYPERLIPIDEMIA-MIXED 02/28/2008  . CAD, NATIVE VESSEL 02/28/2008  . Atrial fibrillation 02/28/2008  . SICK SINUS/ TACHY-BRADY SYNDROME 02/28/2008  . HYPOTENSION, ORTHOSTATIC 02/28/2008  . PACEMAKER, PERMANENT 02/28/2008   Past Medical History: Past Medical History  Diagnosis  Date  . Cardiac pacemaker  in situ     s/p Viatron DDD Pacemaker for sinus node dysfunction  . Orthostatic hypotension   . Hyperlipidemia     mixed  . Atrial fibrillation     not felt to be a coumadin candidate due to falls  . Sinoatrial node dysfunction     s/p PPM (MDT)  . Coronary atherosclerosis of native coronary artery     s/p AWMI 2005 stent LAD  . Diabetes mellitus, type 2   . Chronic renal insufficiency, stage II (mild)   . Intolerance, drug     amniodarone  . Upper GI hemorrhage     2006 Mallory Weiss Tear  . MI (myocardial infarction)     coronary artery status post anterior wall myocardial infarction in 2005 treated with a stent to the left anterior descending.  . Dizziness     recurrent dizziness and falls  . Diabetes mellitus   . Hyperlipidemia   . Renal insufficiency     mild  . Dementia   . Depression   . CHF (congestive heart failure)   . GERD (gastroesophageal reflux disease)     Past Surgical History: Past Surgical History  Procedure Laterality Date  . Pacemaker placement    . Coronary stent placement      Social History: History   Social History  . Marital Status: Widowed    Spouse Name: N/A  . Number of Children: N/A  . Years of Education: N/A   Occupational History  . retired     now living in [facility]   Social History Main Topics  . Smoking status: Former Smoker    Quit date: 03/28/1960  . Smokeless tobacco: Not on file  . Alcohol Use: No  . Drug Use: No  . Sexual Activity: Not on file   Other Topics Concern  . 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  Accompanied by daughter today.    Family History: Family History  Problem Relation Age of Onset  . Coronary artery disease Brother   . Diabetes Mother     Allergies: Allergies  Allergen Reactions  . Amiodarone Hcl Rash  . Sulfonamide Derivatives Hives    Current Facility-Administered Medications  Medication Dose Route  Frequency Provider Last Rate Last Dose  . heparin injection 5,000 Units  5,000 Units Subcutaneous 3 times per day Floydene Flock, MD      . sodium chloride 0.9 % injection 3 mL  3 mL Intravenous Q12H Floydene Flock, MD       Current Outpatient Prescriptions  Medication Sig Dispense Refill  . atorvastatin (LIPITOR) 10 MG tablet Take 10 mg by mouth every morning.     . beta carotene w/minerals (OCUVITE) tablet Take 1 tablet by mouth daily.    . clopidogrel (PLAVIX) 75 MG tablet Take 75 mg by mouth daily.    . Cranberry 500 MG CAPS Take 500 mg by mouth 2 (two) times daily.    Marland Kitchen dextromethorphan (DELSYM) 30 MG/5ML liquid Take 5 mLs by mouth 2 (two) times daily as needed for cough.     . digoxin (LANOXIN) 0.25 MG tablet Take 250 mcg by mouth every morning. *HOLD IF HEART RATE IS LESS THAN 60*    . Emollient (MINERIN) LOTN Apply 1 application topically at bedtime. Applies to feet    . fludrocortisone (FLORINEF) 0.1 MG tablet Take 0.1 mg by mouth 2 (two) times daily.     Marland Kitchen  furosemide (LASIX) 20 MG tablet Take 20 mg by mouth every morning.     . latanoprost (XALATAN) 0.005 % ophthalmic solution Place 1 drop into both eyes every evening. At 8 pm    . metoprolol tartrate (LOPRESSOR) 25 MG tablet Take 25 mg by mouth 2 (two) times daily.    . Multiple Vitamins-Minerals (THEREMS M PO) Take 1 tablet by mouth every morning.    . neomycin-bacitracin-polymyxin (NEOSPORIN) [phone] ointment Apply 1 application topically as directed. 1 application daily to right forearm skin tears until healed    . nystatin-triamcinolone (MYCOLOG II) cream Apply 1 application topically 2 (two) times daily. To inner thighs    . Polyethyl Glycol-Propyl Glycol (SYSTANE ULTRA) 0.4-0.3 % SOLN Place 1 drop into both eyes 4 (four) times daily.    . potassium chloride (KLOR-CON) 20 MEQ packet Take 20 mEq by mouth every morning. Mix with 4-6 ounces of water    . ranitidine (ZANTAC) 150 MG tablet Take 150 mg by mouth at bedtime.    .  sodium chloride (OCEAN) 0.65 % SOLN nasal spray Place 2 sprays into both nostrils 2 (two) times daily.    Marland Kitchen venlafaxine (EFFEXOR) 37.5 MG tablet Take 37.5 mg by mouth every morning.     . brimonidine (ALPHAGAN) 0.2 % ophthalmic solution Place 1 drop into both eyes 2 (two) times daily. Wait 3-5 minutes between each drop    . nystatin cream (MYCOSTATIN) Apply to affected area 2 times daily (Patient taking differently: Apply 1 application topically 2 (two) times daily. Apply to affected area 2 times daily) 30 g 0   Review Of Systems: 12 point ROS negative except as noted above in HPI.  Physical Exam: Filed Vitals:   07/02/14 0030  BP: 142/78  Pulse: 79  Temp:   Resp: 16    General: cachectic, confused  HEENT: PERRLA and extra ocular movement intact Heart: S1, S2 normal, no murmur, rub or gallop, regular rate and rhythm Lungs: clear to auscultation, no wheezes or rales and unlabored breathing Abdomen: abdomen is soft without significant tenderness, masses, organomegaly or guarding Extremities: extremities normal, atraumatic, no cyanosis or edema Skin:no rashes Neurology: normal without focal findings  Labs and Imaging: Lab Results  Component Value Date/Time   NA 139 07/01/2014 09:00 PM   K 3.6 07/01/2014 09:00 PM   CL 103 07/01/2014 09:00 PM   CO2 25 07/01/2014 09:00 PM   BUN 24* 07/01/2014 09:00 PM   CREATININE 1.34 07/01/2014 09:00 PM   GLUCOSE 148* 07/01/2014 09:00 PM   Lab Results  Component Value Date   WBC 4.8 07/01/2014   HGB 12.8* 07/01/2014   HCT 39.4 07/01/2014   MCV 88.5 07/01/2014   PLT 197 07/01/2014    Ct Head Wo Contrast  07/01/2014   CLINICAL DATA:  Patient stood up and passed out. Unresponsive for 5-10 minutes. Loss of consciousness.  EXAM: CT HEAD WITHOUT CONTRAST  CT CERVICAL SPINE WITHOUT CONTRAST  TECHNIQUE: Multidetector CT imaging of the head and cervical spine was performed following the standard protocol without intravenous contrast. Multiplanar CT  image reconstructions of the cervical spine were also generated.  COMPARISON:  CT head 05/23/2014. CT head and cervical spine 05/20/2014.  FINDINGS: CT HEAD FINDINGS  Diffuse cerebral atrophy. Ventricular dilatation consistent with central atrophy. Low-attenuation changes in the deep white matter consistent with small vessel ischemia. Old lacunar infarcts in the deep white matter bilaterally. No mass effect or midline shift. No abnormal extra-axial fluid collections. Gray-white matter junctions are  distinct. Basal cisterns are not effaced. No evidence of acute intracranial hemorrhage. No depressed skull fractures. Visualized paranasal sinuses and mastoid air cells are not opacified.  CT CERVICAL SPINE FINDINGS  Examination is technically limited due to motion artifact despite repeat imaging. Retrolisthesis of C3 on C4. Slight anterolisthesis of C4 on C5 and C7 on T1. Alignment is unchanged since prior study. Degenerative changes throughout the cervical spine with narrowed cervical interspaces and endplate hypertrophic changes. No vertebral compression deformities. No prevertebral soft tissue swelling. Posterior elements appear intact. No focal bone lesion or bone destruction is appreciated. Soft tissues are unremarkable. Incidental note of large bilateral pleural effusions.  IMPRESSION: No acute intracranial abnormalities. Chronic atrophy and small vessel ischemic changes.  Cervical spine is limited due to motion artifact but alignment appears unchanged since previous study and no acute displaced fractures identified. Diffuse degenerative change. Large bilateral pleural effusions.   Electronically Signed   By: Burman NievesWilliam  Stevens M.D.   On: 07/01/2014 23:07   Ct Cervical Spine Wo Contrast  07/01/2014   CLINICAL DATA:  Patient stood up and passed out. Unresponsive for 5-10 minutes. Loss of consciousness.  EXAM: CT HEAD WITHOUT CONTRAST  CT CERVICAL SPINE WITHOUT CONTRAST  TECHNIQUE: Multidetector CT imaging of the  head and cervical spine was performed following the standard protocol without intravenous contrast. Multiplanar CT image reconstructions of the cervical spine were also generated.  COMPARISON:  CT head 05/23/2014. CT head and cervical spine 05/20/2014.  FINDINGS: CT HEAD FINDINGS  Diffuse cerebral atrophy. Ventricular dilatation consistent with central atrophy. Low-attenuation changes in the deep white matter consistent with small vessel ischemia. Old lacunar infarcts in the deep white matter bilaterally. No mass effect or midline shift. No abnormal extra-axial fluid collections. Gray-white matter junctions are distinct. Basal cisterns are not effaced. No evidence of acute intracranial hemorrhage. No depressed skull fractures. Visualized paranasal sinuses and mastoid air cells are not opacified.  CT CERVICAL SPINE FINDINGS  Examination is technically limited due to motion artifact despite repeat imaging. Retrolisthesis of C3 on C4. Slight anterolisthesis of C4 on C5 and C7 on T1. Alignment is unchanged since prior study. Degenerative changes throughout the cervical spine with narrowed cervical interspaces and endplate hypertrophic changes. No vertebral compression deformities. No prevertebral soft tissue swelling. Posterior elements appear intact. No focal bone lesion or bone destruction is appreciated. Soft tissues are unremarkable. Incidental note of large bilateral pleural effusions.  IMPRESSION: No acute intracranial abnormalities. Chronic atrophy and small vessel ischemic changes.  Cervical spine is limited due to motion artifact but alignment appears unchanged since previous study and no acute displaced fractures identified. Diffuse degenerative change. Large bilateral pleural effusions.   Electronically Signed   By: Burman NievesWilliam  Stevens M.D.   On: 07/01/2014 23:07   Dg Chest Portable 1 View  07/01/2014   CLINICAL DATA:  Altered mental status.  EXAM: PORTABLE CHEST - 1 VIEW  COMPARISON:  05/01/2014.  FINDINGS:  The cardiac silhouette remains mildly enlarged. Interval diffuse prominence of the pulmonary vasculature and interstitial markings with an interval moderate-sized left pleural effusion and small right pleural effusion. Stable left subclavian pacemaker leads. Diffuse osteopenia.  IMPRESSION: Interval changes of acute congestive heart failure with a moderate-sized left pleural effusion and small right pleural effusion.   Electronically Signed   By: Beckie SaltsSteven  Reid M.D.   On: 07/01/2014 20:55           Doree AlbeeSteven Kindle Strohmeier MD  Pager: [phone]

## 2014-07-02 NOTE — Care Management Note (Signed)
    Page 1 of 1   07/04/2014     5:12:21 PM CARE MANAGEMENT NOTE 07/04/2014  Patient:  Hinda LenisHUNTER,Renny H   Account Number:  [id number]  Date Initiated:  07/02/2014  Documentation initiated by:  Quentin Strebel  Subjective/Objective Assessment:   Pt adm on 07/01/14 with syncope, AMS.  PT[facility], [facility] for [facility] services.     Action/Plan:   Will consult CSW to facilitate return to SNF when pt medically stable for dc.  Will follow progress.   Anticipated DC Date:  07/03/2014   Anticipated DC Plan:  SKILLED NURSING FACILITY  In-house referral  Clinical Social Worker      DC Planning Services  CM consult      Choice offered to / List presented to:             Status of service:  Completed, signed off Medicare Important Message given?  YES (If response is "NO", the following Medicare IM given date fields will be blank) Date Medicare IM given:  07/04/2014 Medicare IM given by:  Cina Klumpp Date Additional Medicare IM given:   Additional Medicare IM given by:    Discharge Disposition:  SKILLED NURSING FACILITY  Per UR Regulation:  Reviewed for med. necessity/level of care/duration of stay  If discussed at Long Length of Stay Meetings, dates discussed:    Comments:  07/04/14 Sidney AceJulie Garrie Woodin, RN, BSN [phone] Pt discharging to SNF today, per CSW arrangements.

## 2014-07-02 NOTE — Progress Notes (Signed)
UR completed 

## 2014-07-03 DIAGNOSIS — I509 Heart failure, unspecified: Secondary | ICD-10-CM | POA: Diagnosis not present

## 2014-07-03 DIAGNOSIS — F0390 Unspecified dementia without behavioral disturbance: Secondary | ICD-10-CM | POA: Diagnosis not present

## 2014-07-03 DIAGNOSIS — I951 Orthostatic hypotension: Secondary | ICD-10-CM | POA: Diagnosis not present

## 2014-07-03 DIAGNOSIS — I5022 Chronic systolic (congestive) heart failure: Secondary | ICD-10-CM | POA: Diagnosis not present

## 2014-07-03 DIAGNOSIS — I2510 Atherosclerotic heart disease of native coronary artery without angina pectoris: Secondary | ICD-10-CM | POA: Diagnosis not present

## 2014-07-03 DIAGNOSIS — E43 Unspecified severe protein-calorie malnutrition: Secondary | ICD-10-CM | POA: Insufficient documentation

## 2014-07-03 DIAGNOSIS — R55 Syncope and collapse: Secondary | ICD-10-CM | POA: Diagnosis not present

## 2014-07-03 LAB — CBC
HEMATOCRIT: 39.1 % (ref 39.0–52.0)
Hemoglobin: 12.5 g/dL — ABNORMAL LOW (ref 13.0–17.0)
MCH: 28.8 pg (ref 26.0–34.0)
MCHC: 32 g/dL (ref 30.0–36.0)
MCV: 90.1 fL (ref 78.0–100.0)
PLATELETS: 190 10*3/uL (ref 150–400)
RBC: 4.34 MIL/uL (ref 4.22–5.81)
RDW: 15.1 % (ref 11.5–15.5)
WBC: 4.8 10*3/uL (ref 4.0–10.5)

## 2014-07-03 LAB — BASIC METABOLIC PANEL
ANION GAP: 9 (ref 5–15)
BUN: 23 mg/dL (ref 6–23)
CO2: 25 mmol/L (ref 19–32)
CREATININE: 1.32 mg/dL (ref 0.50–1.35)
Calcium: 8.5 mg/dL (ref 8.4–10.5)
Chloride: 108 mmol/L (ref 96–112)
GFR, EST AFRICAN AMERICAN: 53 mL/min — AB (ref >90)
GFR, EST NON AFRICAN AMERICAN: 46 mL/min — AB (ref >90)
Glucose, Bld: 99 mg/dL (ref 70–99)
POTASSIUM: 3.7 mmol/L (ref 3.5–5.1)
Sodium: 142 mmol/L (ref 135–145)

## 2014-07-03 LAB — BRAIN NATRIURETIC PEPTIDE: B NATRIURETIC PEPTIDE 5: 699 pg/mL — AB (ref 0.0–100.0)

## 2014-07-03 LAB — GLUCOSE, CAPILLARY: Glucose-Capillary: 87 mg/dL (ref 70–99)

## 2014-07-03 MED ORDER — HALOPERIDOL LACTATE 5 MG/ML IJ SOLN
2.0000 mg | Freq: Four times a day (QID) | INTRAMUSCULAR | Status: DC | PRN
  Administered 2014-07-03: 2 mg via INTRAVENOUS
  Filled 2014-07-03: qty 1

## 2014-07-03 MED ORDER — FUROSEMIDE 10 MG/ML IJ SOLN
20.0000 mg | Freq: Every day | INTRAMUSCULAR | Status: DC
  Administered 2014-07-03 – 2014-07-04 (×2): 20 mg via INTRAVENOUS
  Filled 2014-07-03 (×2): qty 2

## 2014-07-03 MED ORDER — ENSURE ENLIVE PO LIQD
237.0000 mL | Freq: Two times a day (BID) | ORAL | Status: DC
  Administered 2014-07-04: 237 mL via ORAL

## 2014-07-03 MED ORDER — ADULT MULTIVITAMIN W/MINERALS CH
1.0000 | ORAL_TABLET | Freq: Every day | ORAL | Status: DC
  Administered 2014-07-03 – 2014-07-04 (×2): 1 via ORAL
  Filled 2014-07-03 (×2): qty 1

## 2014-07-03 MED ORDER — ENSURE PUDDING PO PUDG
1.0000 | ORAL | Status: DC
  Administered 2014-07-03 – 2014-07-04 (×2): 1 via ORAL

## 2014-07-03 NOTE — Progress Notes (Signed)
UR completed 

## 2014-07-03 NOTE — Progress Notes (Signed)
[facility] PROGRESS NOTE  [name] [name] JWJ:[id number] DOB: 08/01/1922 DOA: 07/01/2014 PCP: Florentina JennyRIPP, HENRY, MD  Assessment/Plan: 1. Acute respiratory failure from mild CHF exacerbation: - on IV lasix 20 mg daily, daily BMP, watch renal function on IV lasix. 2d echocardiogram ordered and pending.  Nasal canula oxygen as needed.  Daily weights and strict intake and output.    Acute encephalopathy: Resolved. Patient is alert but confused. He appears to be at baseline.    Atrial fibrillation Rate controlled.    Hypertension: BP parameters are borderline.   PCM : Nutrition consulted and recommendations given.      Code Status: DNR Family Communication: FAMILY AT BEDSIDE Disposition Plan: pending.    Consultants: none  Procedures:  echocardiogram  Antibiotics:  none  HPI/Subjective: He is confused and very restless.   Objective: Filed Vitals:   07/03/14 1420  BP: 92/70  Pulse: 79  Temp: 97.7 F (36.5 C)  Resp: 18    Intake/Output Summary (Last 24 hours) at 07/03/14 1651 Last data filed at 07/03/14 0853  Gross per 24 hour  Intake    700 ml  Output    100 ml  Net    600 ml   Filed Weights   07/02/14 0248 07/03/14 0502  Weight: 51.8 kg (114 lb 3.2 oz) 51 kg (112 lb 7 oz)    Exam:   General: alert restless  Cardiovascular:s1s2  Respiratory: diminished air entry at bases  Abdomen:soft non tender non distended bowel sounds heard  Musculoskeletal: no pedal edema.   Data Reviewed: Basic Metabolic Panel:  Recent Labs Lab 07/01/14 2100 07/02/14 0530 07/03/14 0529  NA 139 143 142  K 3.6 3.2* 3.7  CL 103 103 108  CO2 25 29 25   GLUCOSE 148* 103* 99  BUN 24* 20 23  CREATININE 1.34 1.36* 1.32  CALCIUM 8.6 8.6 8.5   Liver Function Tests:  Recent Labs Lab 07/02/14 0530  AST 34  ALT 26  ALKPHOS 111  BILITOT 1.6*  PROT 6.9  ALBUMIN 3.2*   No results for input(s): LIPASE, AMYLASE in the last 168 hours. No results for input(s):  AMMONIA in the last 168 hours. CBC:  Recent Labs Lab 07/01/14 2100 07/02/14 0530 07/03/14 0529  WBC 4.8 4.6 4.8  NEUTROABS 3.6 3.1  --   HGB 12.8* 12.5* 12.5*  HCT 39.4 38.9* 39.1  MCV 88.5 89.4 90.1  PLT 197 206 190   Cardiac Enzymes:  Recent Labs Lab 07/01/14 2100  TROPONINI <0.03   BNP (last 3 results)  Recent Labs  07/01/14 2100 07/03/14 0529  BNP 1697.0* 699.0*    ProBNP (last 3 results)  Recent Labs  12/28/13 0700  PROBNP 14620.0*    CBG:  Recent Labs Lab 07/02/14 0612 07/03/14 0733  GLUCAP 82 87    Recent Results (from the past 240 hour(s))  MRSA PCR Screening     Status: None   Collection Time: 07/02/14  5:44 AM  Result Value Ref Range Status   MRSA by PCR NEGATIVE NEGATIVE Final    Comment:        The GeneXpert MRSA Assay (FDA approved for NASAL specimens only), is one component of a comprehensive MRSA colonization surveillance program. It is not intended to diagnose MRSA infection nor to guide or monitor treatment for MRSA infections.      Studies: Ct Head Wo Contrast  07/01/2014   CLINICAL DATA:  Patient stood up and passed out. Unresponsive for 5-10 minutes. Loss of consciousness.  EXAM:  CT HEAD WITHOUT CONTRAST  CT CERVICAL SPINE WITHOUT CONTRAST  TECHNIQUE: Multidetector CT imaging of the head and cervical spine was performed following the standard protocol without intravenous contrast. Multiplanar CT image reconstructions of the cervical spine were also generated.  COMPARISON:  CT head 05/23/2014. CT head and cervical spine 05/20/2014.  FINDINGS: CT HEAD FINDINGS  Diffuse cerebral atrophy. Ventricular dilatation consistent with central atrophy. Low-attenuation changes in the deep white matter consistent with small vessel ischemia. Old lacunar infarcts in the deep white matter bilaterally. No mass effect or midline shift. No abnormal extra-axial fluid collections. Gray-white matter junctions are distinct. Basal cisterns are not effaced.  No evidence of acute intracranial hemorrhage. No depressed skull fractures. Visualized paranasal sinuses and mastoid air cells are not opacified.  CT CERVICAL SPINE FINDINGS  Examination is technically limited due to motion artifact despite repeat imaging. Retrolisthesis of C3 on C4. Slight anterolisthesis of C4 on C5 and C7 on T1. Alignment is unchanged since prior study. Degenerative changes throughout the cervical spine with narrowed cervical interspaces and endplate hypertrophic changes. No vertebral compression deformities. No prevertebral soft tissue swelling. Posterior elements appear intact. No focal bone lesion or bone destruction is appreciated. Soft tissues are unremarkable. Incidental note of large bilateral pleural effusions.  IMPRESSION: No acute intracranial abnormalities. Chronic atrophy and small vessel ischemic changes.  Cervical spine is limited due to motion artifact but alignment appears unchanged since previous study and no acute displaced fractures identified. Diffuse degenerative change. Large bilateral pleural effusions.   Electronically Signed   By: Burman Nieves M.D.   On: 07/01/2014 23:07   Ct Cervical Spine Wo Contrast  07/01/2014   CLINICAL DATA:  Patient stood up and passed out. Unresponsive for 5-10 minutes. Loss of consciousness.  EXAM: CT HEAD WITHOUT CONTRAST  CT CERVICAL SPINE WITHOUT CONTRAST  TECHNIQUE: Multidetector CT imaging of the head and cervical spine was performed following the standard protocol without intravenous contrast. Multiplanar CT image reconstructions of the cervical spine were also generated.  COMPARISON:  CT head 05/23/2014. CT head and cervical spine 05/20/2014.  FINDINGS: CT HEAD FINDINGS  Diffuse cerebral atrophy. Ventricular dilatation consistent with central atrophy. Low-attenuation changes in the deep white matter consistent with small vessel ischemia. Old lacunar infarcts in the deep white matter bilaterally. No mass effect or midline shift. No  abnormal extra-axial fluid collections. Gray-white matter junctions are distinct. Basal cisterns are not effaced. No evidence of acute intracranial hemorrhage. No depressed skull fractures. Visualized paranasal sinuses and mastoid air cells are not opacified.  CT CERVICAL SPINE FINDINGS  Examination is technically limited due to motion artifact despite repeat imaging. Retrolisthesis of C3 on C4. Slight anterolisthesis of C4 on C5 and C7 on T1. Alignment is unchanged since prior study. Degenerative changes throughout the cervical spine with narrowed cervical interspaces and endplate hypertrophic changes. No vertebral compression deformities. No prevertebral soft tissue swelling. Posterior elements appear intact. No focal bone lesion or bone destruction is appreciated. Soft tissues are unremarkable. Incidental note of large bilateral pleural effusions.  IMPRESSION: No acute intracranial abnormalities. Chronic atrophy and small vessel ischemic changes.  Cervical spine is limited due to motion artifact but alignment appears unchanged since previous study and no acute displaced fractures identified. Diffuse degenerative change. Large bilateral pleural effusions.   Electronically Signed   By: Burman Nieves M.D.   On: 07/01/2014 23:07   Dg Chest Portable 1 View  07/01/2014   CLINICAL DATA:  Altered mental status.  EXAM: PORTABLE CHEST - 1  VIEW  COMPARISON:  05/01/2014.  FINDINGS: The cardiac silhouette remains mildly enlarged. Interval diffuse prominence of the pulmonary vasculature and interstitial markings with an interval moderate-sized left pleural effusion and small right pleural effusion. Stable left subclavian pacemaker leads. Diffuse osteopenia.  IMPRESSION: Interval changes of acute congestive heart failure with a moderate-sized left pleural effusion and small right pleural effusion.   Electronically Signed   By: Beckie Salts M.D.   On: 07/01/2014 20:55    Scheduled Meds: . atorvastatin  10 mg Oral BH-q7a   . clopidogrel  75 mg Oral Daily  . digoxin  250 mcg Oral Daily  . famotidine  20 mg Oral Daily  . feeding supplement (ENSURE ENLIVE)  237 mL Oral BID PC  . feeding supplement (ENSURE)  1 Container Oral Q24H  . fludrocortisone  0.1 mg Oral BID WC  . furosemide  20 mg Oral Daily  . heparin  5,000 Units Subcutaneous 3 times per day  . latanoprost  1 drop Both Eyes QPM  . metoprolol tartrate  25 mg Oral BID  . multivitamin with minerals  1 tablet Oral Daily  . nystatin-triamcinolone  1 application Topical BID  . potassium chloride  20 mEq Oral Daily  . sodium chloride  3 mL Intravenous Q12H  . venlafaxine  37.5 mg Oral Q breakfast   Continuous Infusions:   Active Problems:   Syncope   CHF exacerbation   Protein-calorie malnutrition, severe    Time spent: 25 minutes    [facility]  [facility] Pager [phone]. If 7PM-7AM, please contact night-coverage at [email or web address], password [email or web address] 07/03/2014, 4:51 PM

## 2014-07-03 NOTE — Evaluation (Signed)
Occupational Therapy Evaluation and Discharge Summary Patient Details Name: [name] [name] H Gleghorn MRN: [id number] DOB: September 17, 1922 Today's Date: 07/03/2014    History of Present Illness Pt is 81 yo male with atrial fib, systolic CHF, s/p pacemaker dementia, [facility] and syncopal event. He was admitted for acute encephalopathy, Acute respiratory failure with hypoxia secondary to acute systolic CHF.   Clinical Impression   Pt admitted for the above diagnosis.  Spoke to daughter who stated he had assist with bathing, dressing, [facility] and [facility].  Pt could feed self and still can. Pt appears to be at baseline with adls and does not need further skilled acute OT services at this time.    Follow Up Recommendations  SNF;Supervision/Assistance - 24 hour    Equipment Recommendations  None recommended by OT    Recommendations for Other Services       Precautions / Restrictions Precautions Precautions: Fall Restrictions Weight Bearing Restrictions: No      Mobility Bed Mobility Overal bed mobility: Modified Independent             General bed mobility comments: Requires extra time and encouragement to sit on edge of bed.  Transfers Overall transfer level: Needs assistance Equipment used: Rolling walker (2 wheeled) Transfers: Sit to/from Stand Sit to Stand: Min assist         General transfer comment: min assist for balance. pt with posterior lean. VC for hand placement.    Balance Overall balance assessment: Needs assistance Sitting-balance support: Feet supported Sitting balance-Leahy Scale: Fair     Standing balance support: Bilateral upper extremity supported;During functional activity Standing balance-Leahy Scale: Poor Standing balance comment: Pt had to have outside assist to remain standing.                            ADL Overall ADL's : At baseline                                       General  ADL Comments: Pt at his baseline     Vision Vision Assessment?: No apparent visual deficits   Perception Perception Perception Tested?: No   Praxis Praxis Praxis tested?: Not tested    Pertinent Vitals/Pain Pain Assessment: No/denies pain     Hand Dominance Right   Extremity/Trunk Assessment Upper Extremity Assessment Upper Extremity Assessment: Generalized weakness   Lower Extremity Assessment Lower Extremity Assessment: Defer to PT evaluation   Cervical / Trunk Assessment Cervical / Trunk Assessment: Kyphotic   Communication Communication Communication: HOH   Cognition Arousal/Alertness: Awake/alert Behavior During Therapy: Restless Overall Cognitive Status: History of cognitive impairments - at baseline       Memory: Decreased recall of precautions;Decreased short-term memory             General Comments       Exercises Exercises: General Lower Extremity (limited due to cognition)     Shoulder Instructions      Home Living Family/patient expects to be discharged to:: Skilled nursing facility                                 Additional Comments: [facility]  Pt could walk short distances with walker PTA but had assist with all adls  outside of feeding self.      Prior Functioning/Environment Level of Independence: Needs assistance  Gait / Transfers Assistance Needed: States he uses a RW for mobility ADL's / Homemaking Assistance Needed: Staff assisted with all bathing, dressing, grooming and toileting. pt could feed self.   Comments: From SNF, pt unable to state how much help he receives at [facility].    OT Diagnosis: Generalized weakness;Cognitive deficits   OT Problem List: Decreased strength;Impaired balance (sitting and/or standing);Decreased cognition;Decreased safety awareness;Decreased knowledge of use of DME or AE   OT Treatment/Interventions:      OT Goals(Current goals can be found in the care  plan section) Acute Rehab OT Goals Patient Stated Goal: none stated OT Goal Formulation: All assessment and education complete, DC therapy  OT Frequency:     Barriers to D/C:            Co-evaluation              End of Session Equipment Utilized During Treatment: Rolling walker Nurse Communication: Mobility status  Activity Tolerance: Patient tolerated treatment well Patient left: in bed;with bed alarm set;with call bell/phone within reach   Time: 1200-1215 OT Time Calculation (min): 15 min Charges:  OT General Charges $OT Visit: 1 Procedure OT Evaluation $Initial OT Evaluation Tier I: 1 Procedure G-Codes: OT G-codes **NOT FOR INPATIENT CLASS** Functional Assessment Tool Used: clinical judgement Functional Limitation: Self care Self Care Current Status (Z6109): At least 80 percent but less than 100 percent impaired, limited or restricted Self Care Goal Status (U0454): At least 80 percent but less than 100 percent impaired, limited or restricted Self Care Discharge Status [phone]): At least 80 percent but less than 100 percent impaired, limited or restricted  [name] [name] 07/03/2014, 12:22 PM  [phone]

## 2014-07-03 NOTE — Evaluation (Signed)
Clinical/Bedside Swallow Evaluation Patient Details  Name: [name] [name] MRN: [id number] Date of Birth: [birth year]  Today's Date: 07/03/2014 Time: SLP Start Time (ACUTE ONLY): 1554 SLP Stop Time (ACUTE ONLY): 1604 SLP Time Calculation (min) (ACUTE ONLY): 10 min  Past Medical History:  Past Medical History  Diagnosis Date  . Cardiac pacemaker in situ     s/p Viatron DDD Pacemaker for sinus node dysfunction  . Orthostatic hypotension   . Hyperlipidemia     mixed  . Atrial fibrillation     not felt to be a coumadin candidate due to falls  . Sinoatrial node dysfunction     s/p PPM (MDT)  . Coronary atherosclerosis of native coronary artery     s/p AWMI 2005 stent LAD  . Diabetes mellitus, type 2   . Chronic renal insufficiency, stage II (mild)   . Intolerance, drug     amniodarone  . Upper GI hemorrhage     2006 Mallory Weiss Tear  . MI (myocardial infarction)     coronary artery status post anterior wall myocardial infarction in 2005 treated with a stent to the left anterior descending.  . Dizziness     recurrent dizziness and falls  . Diabetes mellitus   . Hyperlipidemia   . Renal insufficiency     mild  . Dementia   . Depression   . CHF (congestive heart failure)   . GERD (gastroesophageal reflux disease)    Past Surgical History:  Past Surgical History  Procedure Laterality Date  . Pacemaker placement    . Coronary stent placement     HPI:  81 yo male with atrial fib, systolic CHF, s/p pacemaker dementia, [facility] and syncopal event. He was admitted for acute encephalopathy, Acute respiratory failure with hypoxia secondary to acute systolic CHF.   Assessment / Plan / Recommendation Clinical Impression  Pt presents with functional oropharyngeal swallow with sufficient mastication, consistent swallow response, no overt s/s of aspiration.  Pt able to feed himself without difficulty.  No SLP f/u recommended - continue on regular diet, thin liquids.      Aspiration Risk  Mild    Diet Recommendation Regular;Thin liquid   Liquid Administration via: Cup;Straw Medication Administration: Whole meds with liquid Supervision: Patient able to self feed    Other  Recommendations Oral Care Recommendations: Oral care BID   Follow Up Recommendations  None     Swallow Study Prior Functional Status       General Date of Onset: 07/02/14 HPI: 81 yo male with atrial fib, systolic CHF, s/p pacemaker dementia, [facility] and syncopal event. He was admitted for acute encephalopathy, Acute respiratory failure with hypoxia secondary to acute systolic CHF. Type of Study: Bedside swallow evaluation Previous Swallow Assessment: none per records Diet Prior to this Study: Regular;Thin liquids Temperature Spikes Noted: No Respiratory Status: Room air History of Recent Intubation: No Behavior/Cognition: Alert;Cooperative;Confused Oral Cavity - Dentition: Adequate natural dentition Self-Feeding Abilities: Able to feed self Patient Positioning: Upright in bed Baseline Vocal Quality: Clear Volitional Cough: Strong Volitional Swallow: Able to elicit    Oral/Motor/Sensory Function Overall Oral Motor/Sensory Function: Appears within functional limits for tasks assessed   Ice Chips Ice chips: Within functional limits Presentation: Self Fed   Thin Liquid Thin Liquid: Within functional limits Presentation: Cup;Self Fed    Nectar Thick Nectar Thick Liquid: Not tested   Honey Thick Honey Thick Liquid: Not tested   Puree Puree: Within functional limits  Presentation: Self Fed;Spoon   Solid   GO Functional Assessment Tool Used: clinical judgement Functional Limitations: Swallowing Swallow Current Status (W0981): At least 1 percent but less than 20 percent impaired, limited or restricted Swallow Goal Status [phone]): At least 1 percent but less than 20 percent impaired, limited or restricted Swallow Discharge Status [phone]): At least 1  percent but less than 20 percent impaired, limited or restricted  Solid: Within functional limits Presentation: Self Fed       [name] [name] [name] [name] 07/03/2014,4:07 PM

## 2014-07-03 NOTE — Progress Notes (Signed)
Witnessed Student Nurse Amanda give pt medications. Pt name and DOB verified. Medications given properly.  

## 2014-07-03 NOTE — Progress Notes (Signed)
  Echocardiogram 2D Echocardiogram has been performed.  Janalyn HarderWest, Willella Harding R 07/03/2014, 11:37 AM

## 2014-07-03 NOTE — Progress Notes (Signed)
INITIAL NUTRITION ASSESSMENT  DOCUMENTATION CODES Per approved criteria  -Severe malnutrition in the context of chronic illness  Pt meets criteria for SEVERE MALNUTRITION in the context of CHRONIC ILLNESS as evidenced by severe muscle wasting and severe loss of subcutaneous fat mass.  INTERVENTION: Provide Ensure Enlive po BID, each supplement provides 350 kcal and 20 grams of protein Provide Ensure Pudding once daily, provides 170 kcal and 4 grams of protein Provide Multivitamin with minerals daily   NUTRITION DIAGNOSIS: Increased nutrient needs related to underweight status as evidenced by BMI <18.5 kg/(m^2) and estimated needs.   Goal: Pt to meet >/= 90% of their estimated nutrition needs   Monitor:  PO intake, supplement acceptance, weight trend, labs  Reason for Assessment: Consult for assessment of nutrition status/requirements  81 y.o. male  Admitting Dx: <principal problem not specified>  ASSESSMENT: 81 y.o. year old male with significant past medical history of atrial fibrillation, systolic CHF s/p pacemaker, orthostatic hypotension, CAD, dementia presenting with syncope, CHF.  Per nursing notes pt is eating a few bites at most meals, ate 75% of breakfast this morning and has been taking applesauce and Ensure Pudding well. Pt eating lunch at time of visit. He was unable to answer any questions appropriately.  Labs reviewed.   Nutrition Focused Physical Exam:  Subcutaneous Fat:  Orbital Region: mild wasting Upper Arm Region: severe wasting Thoracic and Lumbar Region: severe wasting  Muscle:  Temple Region: severe wasting Clavicle Bone Region: severe wasting Clavicle and Acromion Bone Region: severe wasting Scapular Bone Region: NA Dorsal Hand: NA Patellar Region: severe wasting Anterior Thigh Region: severe wasting Posterior Calf Region: severe wasting  Edema: none   Height: Ht Readings from Last 1 Encounters:  05/22/14  (1.753 m)     Weight: Wt Readings from Last 1 Encounters:  07/03/14 112 lb 7 oz (51 kg)    Ideal Body Weight: 160 lbs  % Ideal Body Weight: 70%  Wt Readings from Last 10 Encounters:  07/03/14 112 lb 7 oz (51 kg)  12/30/13 115 lb 4.8 oz (52.3 kg)  12/24/13 128 lb (58.06 kg)  03/28/13 118 lb 13.3 oz (53.9 kg)  12/20/12 124 lb (56.246 kg)  03/07/12 124 lb 11.2 oz (56.564 kg)  08/10/11 121 lb (54.885 kg)  07/06/11 128 lb (58.06 kg)  05/04/11 125 lb (56.7 kg)  08/05/10 125 lb 12.8 oz (57.063 kg)    Usual Body Weight: 125 lbs  % Usual Body Weight: 90%  BMI:  Body mass index is 16.6 kg/(m^2). (Underweight)  Estimated Nutritional Needs: Kcal: 1550-1750 Protein: 65-80 grams Fluid: 1.5-1.7 L/day  Skin: +1 RLE and LLE edema; stage I pressure ulcer on buttocks  Diet Order: Diet Heart Room service appropriate?: Yes; Fluid consistency:: Thin  EDUCATION NEEDS: -No education needs identified at this time   Intake/Output Summary (Last 24 hours) at 07/03/14 0959 Last data filed at 07/03/14 0853  Gross per 24 hour  Intake    920 ml  Output    100 ml  Net    820 ml    Last BM: 4/6   Labs:   Recent Labs Lab 07/01/14 2100 07/02/14 0530 07/03/14 0529  NA 139 143 142  K 3.6 3.2* 3.7  CL 103 103 108  CO2 BUN 24* 20 23  CREATININE 1.34 1.36* 1.32  CALCIUM 8.6 8.6 8.5  GLUCOSE 148* 103* 99    CBG (last 3)   Recent Labs  07/02/14 0612 07/03/14 0733  GLUCAP  82 87    Scheduled Meds: . atorvastatin  10 mg Oral BH-q7a  . clopidogrel  75 mg Oral Daily  . digoxin  250 mcg Oral Daily  . famotidine  20 mg Oral Daily  . fludrocortisone  0.1 mg Oral BID WC  . furosemide  20 mg Oral Daily  . heparin  5,000 Units Subcutaneous 3 times per day  . latanoprost  1 drop Both Eyes QPM  . metoprolol tartrate  25 mg Oral BID  . nystatin-triamcinolone  1 application Topical BID  . potassium chloride  20 mEq Oral Daily  . sodium chloride  3 mL Intravenous Q12H  . venlafaxine   37.5 mg Oral Q breakfast    Continuous Infusions:   Past Medical History  Diagnosis Date  . Cardiac pacemaker in situ     s/p Viatron DDD Pacemaker for sinus node dysfunction  . Orthostatic hypotension   . Hyperlipidemia     mixed  . Atrial fibrillation     not felt to be a coumadin candidate due to falls  . Sinoatrial node dysfunction     s/p PPM (MDT)  . Coronary atherosclerosis of native coronary artery     s/p AWMI 2005 stent LAD  . Diabetes mellitus, type 2   . Chronic renal insufficiency, stage II (mild)   . Intolerance, drug     amniodarone  . Upper GI hemorrhage     2006 Mallory Weiss Tear  . MI (myocardial infarction)     coronary artery status post anterior wall myocardial infarction in 2005 treated with a stent to the left anterior descending.  . Dizziness     recurrent dizziness and falls  . Diabetes mellitus   . Hyperlipidemia   . Renal insufficiency     mild  . Dementia   . Depression   . CHF (congestive heart failure)   . GERD (gastroesophageal reflux disease)     Past Surgical History  Procedure Laterality Date  . Pacemaker placement    . Coronary stent placement      Ian Malkineanne Barnett RD, LDN Inpatient Clinical Dietitian Pager: [phone] After Hours Pager: [phone]

## 2014-07-03 NOTE — Evaluation (Signed)
Physical Therapy Evaluation Patient Details Name: [name] [name] MRN: [id number] DOB: 08-16-1922 Today's Date: 07/03/2014   History of Present Illness  Pt is 81 yo male with atrial fib, systolic CHF, s/p pacemaker dementia, presented to [facility] ED with AMS and syncopal event. He was admitted for acute encephalopathy, Acute respiratory failure with hypoxia secondary to acute systolic CHF.  Clinical Impression  Pt admitted with the above diagnosis. Pt currently with functional limitations due to the deficits listed below (see PT Problem List). Ambulates slowly up to 70 feet today with min assist for stability while using a rolling walker. Pleasant and willing to participate in therapy evaluation but he is a poor historian with history of dementia at baseline. Pt will benefit from skilled PT to increase their independence and safety with mobility to allow discharge to the venue listed below.       Follow Up Recommendations SNF    Equipment Recommendations  None recommended by PT    Recommendations for Other Services       Precautions / Restrictions Precautions Precautions: Fall Restrictions Weight Bearing Restrictions: No      Mobility  Bed Mobility Overal bed mobility: Modified Independent             General bed mobility comments: Requires extra time and encouragement to sit on edge of bed.  Transfers Overall transfer level: Needs assistance Equipment used: Rolling walker (2 wheeled) Transfers: Sit to/from Stand Sit to Stand: Min assist         General transfer comment: min assist for balance. pt with posterior lean. VC for hand placement.  Ambulation/Gait Ambulation/Gait assistance: Min assist Ambulation Distance (Feet): 70 Feet Assistive device: Rolling walker (2 wheeled) Gait Pattern/deviations: Step-through pattern;Decreased stride length;Scissoring;Narrow base of support;Drifts right/left;Trunk flexed Gait velocity: decreased   General Gait Details: Educated on  safe DME use. Min assist for balance intermittently and RW control during turns. VC for directions and upright posture. Pt became anxious and reported he was scared he would fall. Also reported feeling that his legs were weak however no buckling noted.  Stairs            Wheelchair Mobility    Modified Rankin (Stroke Patients Only)       Balance Overall balance assessment: Needs assistance Sitting-balance support: No upper extremity supported;Feet supported Sitting balance-Leahy Scale: Fair     Standing balance support: Bilateral upper extremity supported Standing balance-Leahy Scale: Poor                               Pertinent Vitals/Pain Pain Assessment: No/denies pain    Home Living Family/patient expects to be discharged to:: Skilled nursing facility                 Additional Comments: Pt is a poor historian with hx of dementia at baseline. He is unsure of the name of the facility in which he resides.    Prior Function Level of Independence: Needs assistance   Gait / Transfers Assistance Needed: States he uses a RW for mobility     Comments: From SNF, pt unable to state how much help he receives at [facility].     Hand Dominance   Dominant Hand: Right    Extremity/Trunk Assessment   Upper Extremity Assessment: Defer to OT evaluation           Lower Extremity Assessment: Generalized weakness         Communication  Communication: HOH  Cognition Arousal/Alertness: Awake/alert Behavior During Therapy: WFL for tasks assessed/performed Overall Cognitive Status: History of cognitive impairments - at baseline                      General Comments General comments (skin integrity, edema, etc.): HR 93 at rest, 119 ambulating. Unable to obtain SpO2 from pulse Ox despite multiple attempts. No dyspnea noted.    Exercises General Exercises - Lower Extremity Long Arc Quad: Strengthening;Both;5 reps;Seated Hip Flexion/Marching:  Strengthening;Both;5 reps;Seated      Assessment/Plan    PT Assessment Patient needs continued PT services  PT Diagnosis Difficulty walking;Abnormality of gait;Generalized weakness   PT Problem List Decreased strength;Decreased activity tolerance;Decreased balance;Decreased mobility;Decreased cognition;Decreased knowledge of use of DME;Decreased safety awareness  PT Treatment Interventions DME instruction;Gait training;Functional mobility training;Therapeutic activities;Therapeutic exercise;Balance training;Neuromuscular re-education;Cognitive remediation;Patient/family education   PT Goals (Current goals can be found in the Care Plan section) Acute Rehab PT Goals Patient Stated Goal: none stated PT Goal Formulation: Patient unable to participate in goal setting Time For Goal Achievement: 07/17/14 Potential to Achieve Goals: Good    Frequency Min 2X/week   Barriers to discharge        Co-evaluation               End of Session Equipment Utilized During Treatment: Gait belt Activity Tolerance: Patient tolerated treatment well Patient left: in chair;with call bell/phone within reach;with chair alarm set Nurse Communication: Mobility status    Functional Assessment Tool Used: clinical observation Functional Limitation: Mobility: Walking and moving around Mobility: Walking and Moving Around Current Status [phone](G8978): At least 20 percent but less than 40 percent impaired, limited or restricted Mobility: Walking and Moving Around Goal Status [phone](G8979): At least 1 percent but less than 20 percent impaired, limited or restricted    Time: 0900-0918 PT Time Calculation (min) (ACUTE ONLY): 18 min   Charges:   PT Evaluation $Initial PT Evaluation Tier I: 1 Procedure     PT G Codes:   PT G-Codes **NOT FOR INPATIENT CLASS** Functional Assessment Tool Used: clinical observation Functional Limitation: Mobility: Walking and moving around Mobility: Walking and Moving Around Current  Status ([id number](G8978): At least 20 percent but less than 40 percent impaired, limited or restricted Mobility: Walking and Moving Around Goal Status [phone](G8979): At least 1 percent but less than 20 percent impaired, limited or restricted    Berton MountBarbour, Shahira Fiske S 07/03/2014, 9:27 AM  Charlsie MerlesLogan Secor Azreal Stthomas, PT [phone]

## 2014-07-04 DIAGNOSIS — I509 Heart failure, unspecified: Secondary | ICD-10-CM | POA: Diagnosis not present

## 2014-07-04 DIAGNOSIS — E43 Unspecified severe protein-calorie malnutrition: Secondary | ICD-10-CM | POA: Diagnosis not present

## 2014-07-04 LAB — GLUCOSE, CAPILLARY
GLUCOSE-CAPILLARY: 41 mg/dL — AB (ref 70–99)
GLUCOSE-CAPILLARY: 71 mg/dL (ref 70–99)
Glucose-Capillary: 49 mg/dL — ABNORMAL LOW (ref 70–99)
Glucose-Capillary: 66 mg/dL — ABNORMAL LOW (ref 70–99)
Glucose-Capillary: 72 mg/dL (ref 70–99)
Glucose-Capillary: 88 mg/dL (ref 70–99)

## 2014-07-04 LAB — BASIC METABOLIC PANEL
ANION GAP: 6 (ref 5–15)
BUN: 24 mg/dL — ABNORMAL HIGH (ref 6–23)
CHLORIDE: 104 mmol/L (ref 96–112)
CO2: 32 mmol/L (ref 19–32)
Calcium: 8.6 mg/dL (ref 8.4–10.5)
Creatinine, Ser: 1.37 mg/dL — ABNORMAL HIGH (ref 0.50–1.35)
GFR calc Af Amer: 51 mL/min — ABNORMAL LOW (ref >90)
GFR calc non Af Amer: 44 mL/min — ABNORMAL LOW (ref >90)
GLUCOSE: 137 mg/dL — AB (ref 70–99)
POTASSIUM: 3.3 mmol/L — AB (ref 3.5–5.1)
SODIUM: 142 mmol/L (ref 135–145)

## 2014-07-04 MED ORDER — SENNOSIDES-DOCUSATE SODIUM 8.6-50 MG PO TABS
2.0000 | ORAL_TABLET | Freq: Two times a day (BID) | ORAL | Status: DC
  Administered 2014-07-04: 2 via ORAL
  Filled 2014-07-04 (×2): qty 2

## 2014-07-04 MED ORDER — POLYETHYLENE GLYCOL 3350 17 G PO PACK
17.0000 g | PACK | Freq: Every day | ORAL | Status: DC
  Administered 2014-07-04: 17 g via ORAL
  Filled 2014-07-04: qty 1

## 2014-07-04 MED ORDER — ENSURE ENLIVE PO LIQD: 237.0000 mL | Freq: Two times a day (BID) | ORAL | Status: AC

## 2014-07-04 MED ORDER — ENSURE PUDDING PO PUDG: 1.0000 | ORAL | Status: AC

## 2014-07-04 MED ORDER — POTASSIUM CHLORIDE CRYS ER 20 MEQ PO TBCR
40.0000 meq | EXTENDED_RELEASE_TABLET | Freq: Two times a day (BID) | ORAL | Status: DC
  Administered 2014-07-04: 40 meq via ORAL

## 2014-07-04 NOTE — Progress Notes (Signed)
Pt has orders to be discharged. Pt family was called and notified of discharge and has no additional questions at this time. IV removed and site in good condition. Pt stable and [facility]. Report called and attempted to give to RN but was unsuccessful after multiple attempts trying to reach receiving RN.   Jilda PandaBethany Marlie Kuennen RN

## 2014-07-04 NOTE — Progress Notes (Signed)
CBG was 66, gave Oj and some applesauce, rechecked went to 71, then later rechecked again and it was 88, will continue to monitor, thanks Lavonda JumboMike  F RN

## 2014-07-04 NOTE — Discharge Summary (Signed)
Physician Discharge Summary  [name] LenisHoward H [name] XBM:[id number] DOB: 06-09-1922 DOA: 07/01/2014  PCP: [name] [name], HENRY, MD  Admit date: 07/01/2014 Discharge date: 07/04/2014  Time spent: 30 minutes  Recommendations for Outpatient Follow-up:  1. Please follow up with BMP, ELECTROLYTES.  2. Please follow up with a cardiology in 1 to 2 weeks.  3. Please follow up with a PCP.   Discharge Diagnoses:  Active Problems:   Syncope   CHF exacerbation   Protein-calorie malnutrition, severe   Discharge Condition: improved  Diet recommendation: low sodium diet  Filed Weights   07/02/14 0248 07/03/14 0502 07/04/14 0527  Weight: 51.8 kg (114 lb 3.2 oz) 51 kg (112 lb 7 oz) 49.2 kg (108 lb 7.5 oz)    History of present illness:  This is a 81 y.o. year old male with significant past medical history of atrial fibrillation, systolic CHF s/p pacemaker, orthostatic hypotension, CAD, dementia presenting with syncope.  Hospital Course:  1. Acute respiratory failure from mild CHF exacerbation: He was started on IV lasix 20 mg daily,2d echocardiogram ordered showed further decline in LVEF , discussed with the daughter and recommend outpatient follow up with cardiologist. Plan to transition to oral lasix on discharge. .  Nasal canula oxygen as needed.  Daily weights and strict intake and output.    Acute encephalopathy: Resolved. Patient is alert but confused. He appears to be at baseline.    Atrial fibrillation Rate controlled.    Hypertension: BP parameters are borderline.   PCM : Nutrition consulted and recommendations given.   Syncope: Unclear etiology. Does nt appear to be from orthostatic and his bp parameters have been stable.  Would not increase his florinef. Fall precautions.   Hypokalemia: repleted, please repeat potassium in am.   Procedures:  Echocardiogram.   Consultations:  none  Discharge Exam: Filed Vitals:   07/04/14 0958  BP: 150/100  Pulse: 76  Temp: 97.7 F (36.5  C)  Resp: 16    General: alert afebrile comfortable, not in distress Cardiovascular: S1S2, IRREGULAR Respiratory: no wheezing or rhonchi, fair air entry  Discharge Instructions   Discharge Instructions    Diet - low sodium heart healthy    Complete by:  As directed      Discharge instructions    Complete by:  As directed   Follow up with cardiology in 2 weeks.          Current Discharge Medication List    START taking these medications   Details  feeding supplement, ENSURE ENLIVE, (ENSURE ENLIVE) LIQD Take 237 mLs by mouth 2 (two) times daily after a meal. Qty: 237 mL, Refills: 12    feeding supplement, ENSURE, (ENSURE) PUDG Take 1 Container by mouth daily. Refills: 0      CONTINUE these medications which have NOT CHANGED   Details  atorvastatin (LIPITOR) 10 MG tablet Take 10 mg by mouth every morning.     !! beta carotene w/minerals (OCUVITE) tablet Take 1 tablet by mouth daily.    clopidogrel (PLAVIX) 75 MG tablet Take 75 mg by mouth daily.    Cranberry 500 MG CAPS Take 500 mg by mouth 2 (two) times daily.    dextromethorphan (DELSYM) 30 MG/5ML liquid Take 5 mLs by mouth 2 (two) times daily as needed for cough.     digoxin (LANOXIN) 0.25 MG tablet Take 250 mcg by mouth every morning. *HOLD IF HEART RATE IS LESS THAN 60*    Emollient (MINERIN) LOTN Apply 1 application topically at bedtime. Applies to  feet    fludrocortisone (FLORINEF) 0.1 MG tablet Take 0.1 mg by mouth 2 (two) times daily.     furosemide (LASIX) 20 MG tablet Take 20 mg by mouth every morning.     latanoprost (XALATAN) 0.005 % ophthalmic solution Place 1 drop into both eyes every evening. At 8 pm    metoprolol tartrate (LOPRESSOR) 25 MG tablet Take 25 mg by mouth 2 (two) times daily.    !! Multiple Vitamins-Minerals (THEREMS M PO) Take 1 tablet by mouth every morning.    neomycin-bacitracin-polymyxin (NEOSPORIN) [phone] ointment Apply 1 application topically as directed. 1 application  daily to right forearm skin tears until healed    nystatin-triamcinolone (MYCOLOG II) cream Apply 1 application topically 2 (two) times daily. To inner thighs    Polyethyl Glycol-Propyl Glycol (SYSTANE ULTRA) 0.4-0.3 % SOLN Place 1 drop into both eyes 4 (four) times daily.    potassium chloride (KLOR-CON) 20 MEQ packet Take 20 mEq by mouth every morning. Mix with 4-6 ounces of water    ranitidine (ZANTAC) 150 MG tablet Take 150 mg by mouth at bedtime.    sodium chloride (OCEAN) 0.65 % SOLN nasal spray Place 2 sprays into both nostrils 2 (two) times daily.    venlafaxine (EFFEXOR) 37.5 MG tablet Take 37.5 mg by mouth every morning.     brimonidine (ALPHAGAN) 0.2 % ophthalmic solution Place 1 drop into both eyes 2 (two) times daily. Wait 3-5 minutes between each drop     !! - Potential duplicate medications found. Please discuss with provider.    STOP taking these medications     nystatin cream (MYCOSTATIN)        Allergies  Allergen Reactions  . Amiodarone Hcl Rash  . Sulfonamide Derivatives Hives      The results of significant diagnostics from this hospitalization (including imaging, microbiology, ancillary and laboratory) are listed below for reference.    Significant Diagnostic Studies: Ct Head Wo Contrast  07/01/2014   CLINICAL DATA:  Patient stood up and passed out. Unresponsive for 5-10 minutes. Loss of consciousness.  EXAM: CT HEAD WITHOUT CONTRAST  CT CERVICAL SPINE WITHOUT CONTRAST  TECHNIQUE: Multidetector CT imaging of the head and cervical spine was performed following the standard protocol without intravenous contrast. Multiplanar CT image reconstructions of the cervical spine were also generated.  COMPARISON:  CT head 05/23/2014. CT head and cervical spine 05/20/2014.  FINDINGS: CT HEAD FINDINGS  Diffuse cerebral atrophy. Ventricular dilatation consistent with central atrophy. Low-attenuation changes in the deep white matter consistent with small vessel ischemia. Old  lacunar infarcts in the deep white matter bilaterally. No mass effect or midline shift. No abnormal extra-axial fluid collections. Gray-white matter junctions are distinct. Basal cisterns are not effaced. No evidence of acute intracranial hemorrhage. No depressed skull fractures. Visualized paranasal sinuses and mastoid air cells are not opacified.  CT CERVICAL SPINE FINDINGS  Examination is technically limited due to motion artifact despite repeat imaging. Retrolisthesis of C3 on C4. Slight anterolisthesis of C4 on C5 and C7 on T1. Alignment is unchanged since prior study. Degenerative changes throughout the cervical spine with narrowed cervical interspaces and endplate hypertrophic changes. No vertebral compression deformities. No prevertebral soft tissue swelling. Posterior elements appear intact. No focal bone lesion or bone destruction is appreciated. Soft tissues are unremarkable. Incidental note of large bilateral pleural effusions.  IMPRESSION: No acute intracranial abnormalities. Chronic atrophy and small vessel ischemic changes.  Cervical spine is limited due to motion artifact but alignment appears unchanged since previous  study and no acute displaced fractures identified. Diffuse degenerative change. Large bilateral pleural effusions.   Electronically Signed   By: Burman Nieves M.D.   On: 07/01/2014 23:07   Ct Cervical Spine Wo Contrast  07/01/2014   CLINICAL DATA:  Patient stood up and passed out. Unresponsive for 5-10 minutes. Loss of consciousness.  EXAM: CT HEAD WITHOUT CONTRAST  CT CERVICAL SPINE WITHOUT CONTRAST  TECHNIQUE: Multidetector CT imaging of the head and cervical spine was performed following the standard protocol without intravenous contrast. Multiplanar CT image reconstructions of the cervical spine were also generated.  COMPARISON:  CT head 05/23/2014. CT head and cervical spine 05/20/2014.  FINDINGS: CT HEAD FINDINGS  Diffuse cerebral atrophy. Ventricular dilatation consistent  with central atrophy. Low-attenuation changes in the deep white matter consistent with small vessel ischemia. Old lacunar infarcts in the deep white matter bilaterally. No mass effect or midline shift. No abnormal extra-axial fluid collections. Gray-white matter junctions are distinct. Basal cisterns are not effaced. No evidence of acute intracranial hemorrhage. No depressed skull fractures. Visualized paranasal sinuses and mastoid air cells are not opacified.  CT CERVICAL SPINE FINDINGS  Examination is technically limited due to motion artifact despite repeat imaging. Retrolisthesis of C3 on C4. Slight anterolisthesis of C4 on C5 and C7 on T1. Alignment is unchanged since prior study. Degenerative changes throughout the cervical spine with narrowed cervical interspaces and endplate hypertrophic changes. No vertebral compression deformities. No prevertebral soft tissue swelling. Posterior elements appear intact. No focal bone lesion or bone destruction is appreciated. Soft tissues are unremarkable. Incidental note of large bilateral pleural effusions.  IMPRESSION: No acute intracranial abnormalities. Chronic atrophy and small vessel ischemic changes.  Cervical spine is limited due to motion artifact but alignment appears unchanged since previous study and no acute displaced fractures identified. Diffuse degenerative change. Large bilateral pleural effusions.   Electronically Signed   By: Burman Nieves M.D.   On: 07/01/2014 23:07   Dg Chest Portable 1 View  07/01/2014   CLINICAL DATA:  Altered mental status.  EXAM: PORTABLE CHEST - 1 VIEW  COMPARISON:  05/01/2014.  FINDINGS: The cardiac silhouette remains mildly enlarged. Interval diffuse prominence of the pulmonary vasculature and interstitial markings with an interval moderate-sized left pleural effusion and small right pleural effusion. Stable left subclavian pacemaker leads. Diffuse osteopenia.  IMPRESSION: Interval changes of acute congestive heart failure  with a moderate-sized left pleural effusion and small right pleural effusion.   Electronically Signed   By: Beckie Salts M.D.   On: 07/01/2014 20:55    Microbiology: Recent Results (from the past 240 hour(s))  MRSA PCR Screening     Status: None   Collection Time: 07/02/14  5:44 AM  Result Value Ref Range Status   MRSA by PCR NEGATIVE NEGATIVE Final    Comment:        The GeneXpert MRSA Assay (FDA approved for NASAL specimens only), is one component of a comprehensive MRSA colonization surveillance program. It is not intended to diagnose MRSA infection nor to guide or monitor treatment for MRSA infections.      Labs: Basic Metabolic Panel:  Recent Labs Lab 07/01/14 2100 07/02/14 0530 07/03/14 0529 07/04/14 0243  NA 139 143 142 142  K 3.6 3.2* 3.7 3.3*  CL 103 103 108 104  CO2 25 29 25  32  GLUCOSE 148* 103* 99 137*  BUN 24* 20 23 24*  CREATININE 1.34 1.36* 1.32 1.37*  CALCIUM 8.6 8.6 8.5 8.6   Liver Function Tests:  Recent Labs Lab 07/02/14 0530  AST 34  ALT 26  ALKPHOS 111  BILITOT 1.6*  PROT 6.9  ALBUMIN 3.2*   No results for input(s): LIPASE, AMYLASE in the last 168 hours. No results for input(s): AMMONIA in the last 168 hours. CBC:  Recent Labs Lab 07/01/14 2100 07/02/14 0530 07/03/14 0529  WBC 4.8 4.6 4.8  NEUTROABS 3.6 3.1  --   HGB 12.8* 12.5* 12.5*  HCT 39.4 38.9* 39.1  MCV 88.5 89.4 90.1  PLT 197 206 190   Cardiac Enzymes:  Recent Labs Lab 07/01/14 2100  TROPONINI <0.03   BNP: BNP (last 3 results)  Recent Labs  07/01/14 2100 07/03/14 0529  BNP 1697.0* 699.0*    ProBNP (last 3 results)  Recent Labs  12/28/13 0700  PROBNP 14620.0*    CBG:  Recent Labs Lab 07/04/14 0544 07/04/14 0547 07/04/14 0620 07/04/14 0623 07/04/14 0647  GLUCAP 72 66* 49* 71 88       Signed:  Gianfranco Araki  [facility] 07/04/2014, 10:21 AM

## 2014-07-04 NOTE — Progress Notes (Signed)
UR completed 

## 2014-07-07 ENCOUNTER — Ambulatory Visit (INDEPENDENT_AMBULATORY_CARE_PROVIDER_SITE_OTHER): Payer: Medicare Other | Admitting: Physician Assistant

## 2014-07-07 ENCOUNTER — Encounter: Payer: Self-pay | Admitting: Physician Assistant

## 2014-07-07 VITALS — BP 104/60 | HR 82 | Ht 69.0 in | Wt 111.1 lb

## 2014-07-07 DIAGNOSIS — R634 Abnormal weight loss: Secondary | ICD-10-CM

## 2014-07-07 DIAGNOSIS — I2510 Atherosclerotic heart disease of native coronary artery without angina pectoris: Secondary | ICD-10-CM | POA: Diagnosis not present

## 2014-07-07 DIAGNOSIS — R55 Syncope and collapse: Secondary | ICD-10-CM | POA: Diagnosis not present

## 2014-07-07 DIAGNOSIS — E876 Hypokalemia: Secondary | ICD-10-CM | POA: Diagnosis not present

## 2014-07-07 DIAGNOSIS — N183 Chronic kidney disease, stage 3 unspecified: Secondary | ICD-10-CM

## 2014-07-07 DIAGNOSIS — I482 Chronic atrial fibrillation, unspecified: Secondary | ICD-10-CM

## 2014-07-07 DIAGNOSIS — I5022 Chronic systolic (congestive) heart failure: Secondary | ICD-10-CM | POA: Diagnosis not present

## 2014-07-07 LAB — MDC_IDC_ENUM_SESS_TYPE_INCLINIC: MDC IDC PG SERIAL: 2706030895

## 2014-07-07 LAB — BASIC METABOLIC PANEL
BUN: 51 mg/dL — AB (ref 6–23)
CALCIUM: 9.4 mg/dL (ref 8.4–10.5)
CO2: 34 mEq/L — ABNORMAL HIGH (ref 19–32)
CREATININE: 1.55 mg/dL — AB (ref 0.40–1.50)
Chloride: 103 mEq/L (ref 96–112)
GFR: 44.99 mL/min — AB (ref >60.00)
Glucose, Bld: 131 mg/dL — ABNORMAL HIGH (ref 70–99)
Potassium: 4.9 mEq/L (ref 3.5–5.1)
Sodium: 140 mEq/L (ref 135–145)

## 2014-07-07 NOTE — Assessment & Plan Note (Signed)
Heart failure is compensated. Follow-up renal function. And potassium

## 2014-07-07 NOTE — Patient Instructions (Signed)
Medication Instructions:  None  Labwork: Bmet  Testing/Procedures: None  Follow-Up: With device clinic for end of life battery  Any Other Special Instructions Will Be Listed Below (If Applicable).

## 2014-07-07 NOTE — Progress Notes (Signed)
Cardiology Office Note   Date:  07/07/2014   ID:  [name] [name], DOB 25-Oct-1922, MRN [id number]  PCP:  Georgann Housekeeper, MD  Cardiologist:  Verne Carrow M.D. EPS: Dr. Johney Frame  Chief Complaint: Post hospital follow-up    History of Present Illness: [name] [name] is a 81 y.o. male who presents for post hospital follow-up. He was admitted with a acute respiratory failure from mild CHF treated with IV Lasix and syncope. He was not seen by cardiology. He did not appear to be orthostatic but they did not increase his Florinef. He was also hypokalemic.2-D echo was done in the hospital and EF was 30-35% with diffuse hypokinesis. There is also hypokinesis of the anterior septal myocardium and moderate AI.previous 2-D echo in 2011 EF was 45-50%.    Patient has history of CAD status post anterior wall MI in 2005 treated with stent to the LAD, systolic CHF and atrial fibrillation(not on Coumadin because of frequent falls),  as well as pacemaker. He also has history of CVA in 2011, severe dementia and really sides in assisted living.   He is brought in by his daughter. Patient was apparantley walking to the lunch room with his walker and passed out for ? 5-10 minutes. Troponins negative Dig level 1.7 BNP 1700. Head and C-spine CT negative. CXR acute CHF with moderate size left pleural effusion and small right effusion. Pacer interrogated and he is 6 months end of life. He just moved to total care facility [facility]. He has dementia and can't give any history. He was being treated for pneumonia at the nursing home prior to admission but white count was normal.    Past Medical History  Diagnosis Date  . Cardiac pacemaker in situ     s/p Viatron DDD Pacemaker for sinus node dysfunction  . Orthostatic hypotension   . Hyperlipidemia     mixed  . Atrial fibrillation     not felt to be a coumadin candidate due to falls  . Sinoatrial node dysfunction     s/p PPM (MDT)  . Coronary atherosclerosis  of native coronary artery     s/p AWMI 2005 stent LAD  . Diabetes mellitus, type 2   . Chronic renal insufficiency, stage II (mild)   . Intolerance, drug     amniodarone  . Upper GI hemorrhage     2006 Mallory Weiss Tear  . MI (myocardial infarction)     coronary artery status post anterior wall myocardial infarction in 2005 treated with a stent to the left anterior descending.  . Dizziness     recurrent dizziness and falls  . Diabetes mellitus   . Hyperlipidemia   . Renal insufficiency     mild  . Dementia   . Depression   . CHF (congestive heart failure)   . GERD (gastroesophageal reflux disease)     Past Surgical History  Procedure Laterality Date  . Pacemaker placement    . Coronary stent placement       Current Outpatient Prescriptions  Medication Sig Dispense Refill  . atorvastatin (LIPITOR) 10 MG tablet Take 10 mg by mouth every morning.     . beta carotene w/minerals (OCUVITE) tablet Take 1 tablet by mouth daily.    . brimonidine (ALPHAGAN) 0.2 % ophthalmic solution Place 1 drop into both eyes 2 (two) times daily. Wait 3-5 minutes between each drop    . clopidogrel (PLAVIX) 75 MG tablet Take 75 mg by mouth daily.    . Cranberry 500  MG CAPS Take 500 mg by mouth 2 (two) times daily.    Marland Kitchen dextromethorphan (DELSYM) 30 MG/5ML liquid Take 5 mLs by mouth 2 (two) times daily as needed for cough.     . digoxin (LANOXIN) 0.25 MG tablet Take 250 mcg by mouth every morning. *HOLD IF HEART RATE IS LESS THAN 60*    . Emollient (MINERIN) LOTN Apply 1 application topically at bedtime. Applies to feet    . feeding supplement, ENSURE ENLIVE, (ENSURE ENLIVE) LIQD Take 237 mLs by mouth 2 (two) times daily after a meal. 237 mL 12  . feeding supplement, ENSURE, (ENSURE) PUDG Take 1 Container by mouth daily.  0  . fludrocortisone (FLORINEF) 0.1 MG tablet Take 0.1 mg by mouth 2 (two) times daily.     . furosemide (LASIX) 20 MG tablet Take 20 mg by mouth every morning.     . latanoprost  (XALATAN) 0.005 % ophthalmic solution Place 1 drop into both eyes every evening. At 8 pm    . metoprolol tartrate (LOPRESSOR) 25 MG tablet Take 25 mg by mouth 2 (two) times daily.    . Multiple Vitamins-Minerals (THEREMS M PO) Take 1 tablet by mouth every morning.    . neomycin-bacitracin-polymyxin (NEOSPORIN) [phone] ointment Apply 1 application topically as directed. 1 application daily to right forearm skin tears until healed    . nystatin-triamcinolone (MYCOLOG II) cream Apply 1 application topically 2 (two) times daily. To inner thighs    . Polyethyl Glycol-Propyl Glycol (SYSTANE ULTRA) 0.4-0.3 % SOLN Place 1 drop into both eyes 4 (four) times daily.    . potassium chloride (KLOR-CON) 20 MEQ packet Take 20 mEq by mouth every morning. Mix with 4-6 ounces of water    . ranitidine (ZANTAC) 150 MG tablet Take 150 mg by mouth at bedtime.    . sodium chloride (OCEAN) 0.65 % SOLN nasal spray Place 2 sprays into both nostrils 2 (two) times daily.    Marland Kitchen venlafaxine (EFFEXOR) 37.5 MG tablet Take 37.5 mg by mouth every morning.      No current facility-administered medications for this visit.    Allergies:   Amiodarone hcl and Sulfonamide derivatives    Social History:  The patient  reports that he quit smoking about 54 years ago. He does not have any smokeless tobacco history on file. He reports that he does not drink alcohol or use illicit drugs.   Family History:  The patient's    family history includes Coronary artery disease in his brother; Diabetes in his mother.    ROS:  Please see the history of present illness.   Otherwise, review of systems are positive for chronic back pain.   All other systems are reviewed and negative.    PHYSICAL EXAM: BP 104/60 mmHg  Pulse 82  Ht  (1.753 m)  Wt 111 lb 1.9 oz (50.404 kg)  BMI 16.40 kg/m2 GENelderly, cachectic, no acute distress Neck: no JVD, HJR, carotid bruits, or masses Cardiacirregular irregular  2/6 systolic murmur at left sternal  border, nollop, rubs, thrill or heave,  Respiratory:   decreased breath sounds but clear to auscultation bilaterally, normal work of breathing GI: soft, nontender, nondistended, + BS MS: no deformity or atrophy Extremities: without cyanosis, clubbing, edema, good distal pulses bilaterally.  Skin: warm and dry, no rash Neuro: Weak, in a wheelchair, dementia    EKG is ordered today. The ekg ordered today demonstrates  atrial fibrillation at 82 bpm inferior lateral ST T wave changes, unchanged from  prior tracings Labs: 12/28/2013: Pro B Natriuretic peptide (BNP) 14620.0*; TSH 0.914 07/02/2014: ALT 26 07/03/2014: B Natriuretic Peptide 699.0*; Hemoglobin 12.5*; Platelets 190 07/04/2014: BUN 24*; Creatinine 1.37*; Potassium 3.3*; Sodium 142    Lipid Panel    Component Value Date/Time   CHOL  04/20/2009 0451    91        ATP III CLASSIFICATION:  <200     mg/dL   Desirable  [id number]  mg/dL   Borderline High  >=308>=240    mg/dL   High          TRIG 66 04/20/2009 0451   HDL 42 04/20/2009 0451   CHOLHDL 2.2 04/20/2009 0451   VLDL 13 04/20/2009 0451   LDLCALC  04/20/2009 0451    36        Total Cholesterol/HDL:CHD Risk Coronary Heart Disease Risk Table                     Men   Women  1/2 Average Risk   3.4   3.3  Average Risk       5.0   4.4  2 X Average Risk   9.6   7.1  3 X Average Risk  23.4   11.0        Use the calculated Patient Ratio above and the CHD Risk Table to determine the patient's CHD Risk.        ATP III CLASSIFICATION (LDL):  <100     mg/dL   Optimal  [id number]  mg/dL   Near or Above                    Optimal  130-159  mg/dL   Borderline  [phone]  mg/dL   High  >841>190     mg/dL   Very High      Wt Readings from Last 3 Encounters:  07/04/14 108 lb 7.5 oz (49.2 kg)  12/30/13 115 lb 4.8 oz (52.3 kg)  12/24/13 128 lb (58.06 kg)      Other studies Reviewed: Additional studies/ records that were reviewed today include and review of the records demonstrates:  2-D echo  07/03/14 Study Conclusions  - Left ventricle: The cavity size was normal. There was mild   concentric hypertrophy. Systolic function was moderately to   severely reduced. The estimated ejection fraction was in the   range of 30% to 35%. Diffuse hypokinesis. There is hypokinesis of   the anteroseptal myocardium. - Aortic valve: There was moderate regurgitation. - Mitral valve: Calcified annulus. Mildly thickened, mildly   calcified leaflets . There was mild regurgitation directed   eccentrically. - Left atrium: The atrium was moderately dilated. - Right ventricle: The cavity size was mildly dilated. Wall   thickness was normal. - Right atrium: The atrium was moderately dilated. - Tricuspid valve: There was moderate regurgitation. - Pericardium, extracardiac: There was a left pleural effusion.    ASSESSMENT AND PLAN:  Syncope Patient had an episode of syncope at the nursing home with admission to the hospital. 2-D echo reveals worsening LV dysfunction EF 30-35%. He did have heart failure treated with IV diuresis. Potassium was low at 3.3 and was replenished. He was not orthostatic. CT of head was negative. Pacemaker is 6 months until end-of-life. Fortunately he is only 16% pacer dependent. The daughter has a very difficult time transporting him here. He should have monthly checkups but she said she can't get him here before 2 or 3 months. Since  he is not totally pacer dependent this seems to be a reasonable compromise. I discussed this patient with Dr. Clifton James who concurs. I also discussed the end-of-life issues in detail with his daughter. They will see Dr. Johney Frame in 2-3 months.   SYSTOLIC HEART FAILURE, CHRONIC Heart failure is compensated. Follow-up renal function. And potassium   Coronary atherosclerosis Stable   Chronic atrial fibrillation Rate controlled.   CKD (chronic kidney disease) stage 3, GFR 30-59 ml/min Check renal function.   WEIGHT LOSS-ABNORMAL Patient  continues to lose weight he is down to 119 pounds. His daughter states that he eats pretty well.     Elson Clan, PA-C  07/07/2014 1:50 PM    [facility] [address] [place], [place], [place]  [address] Phone: [phone]; Fax: [phone]

## 2014-07-07 NOTE — Assessment & Plan Note (Addendum)
Patient had an episode of syncope at the nursing home with admission to the hospital. 2-D echo reveals worsening LV dysfunction EF 30-35%. He did have heart failure treated with IV diuresis. Potassium was low at 3.3 and was replenished. He was not orthostatic. CT of head was negative. Pacemaker is 6 months until end-of-life. Fortunately he is only 16% pacer dependent. The daughter has a very difficult time transporting him here. He should have monthly checkups but she said she can't get him here before 2 or 3 months. Since he is not totally pacer dependent this seems to be a reasonable compromise. I discussed this patient with Dr. Clifton JamesMcAlhany who concurs. I also discussed the end-of-life issues in detail with his daughter. They will see Dr. Johney FrameAllred in 2-3 months.

## 2014-07-07 NOTE — Assessment & Plan Note (Signed)
Stable

## 2014-07-07 NOTE — Assessment & Plan Note (Signed)
Rate controlled 

## 2014-07-07 NOTE — Assessment & Plan Note (Signed)
Patient continues to lose weight he is down to 119 pounds. His daughter states that he eats pretty well.

## 2014-07-07 NOTE — Assessment & Plan Note (Signed)
Check renal function. 

## 2014-07-10 ENCOUNTER — Encounter: Payer: Self-pay | Admitting: Internal Medicine

## 2014-07-16 ENCOUNTER — Encounter: Payer: Self-pay | Admitting: Internal Medicine

## 2014-07-25 ENCOUNTER — Other Ambulatory Visit (HOSPITAL_COMMUNITY): Payer: Self-pay | Admitting: Psychiatry

## 2014-07-25 DIAGNOSIS — R1314 Dysphagia, pharyngoesophageal phase: Secondary | ICD-10-CM

## 2014-07-31 ENCOUNTER — Telehealth: Payer: Self-pay | Admitting: *Deleted

## 2014-07-31 NOTE — Telephone Encounter (Signed)
-----   Message from Dyann KiefMichele M Lenze, PA-C sent at 07/09/2014  7:29 AM EDT ----- Renal function a bit worse, potassium normal. Repeat bmet in 1 week-can be done at assisted living and sent to us

## 2014-08-06 ENCOUNTER — Ambulatory Visit (HOSPITAL_COMMUNITY)
Admission: RE | Admit: 2014-08-06 | Discharge: 2014-08-06 | Disposition: A | Discharge status: Home / Self Care | Payer: Medicare Other | Source: Ambulatory Visit | Attending: Internal Medicine | Admitting: Internal Medicine

## 2014-08-06 DIAGNOSIS — R1314 Dysphagia, pharyngoesophageal phase: Secondary | ICD-10-CM | POA: Insufficient documentation

## 2014-10-11 ENCOUNTER — Emergency Department (HOSPITAL_COMMUNITY): Payer: Medicare Other

## 2014-10-11 ENCOUNTER — Encounter (HOSPITAL_COMMUNITY): Payer: Self-pay | Admitting: Emergency Medicine

## 2014-10-11 ENCOUNTER — Inpatient Hospital Stay (HOSPITAL_COMMUNITY)
Admission: EM | Admit: 2014-10-11 | Discharge: 2014-10-13 | DRG: 871 | Disposition: A | Discharge status: Skilled Nursing Facility | Payer: Medicare Other | Attending: Internal Medicine | Admitting: Internal Medicine

## 2014-10-11 DIAGNOSIS — F329 Major depressive disorder, single episode, unspecified: Secondary | ICD-10-CM | POA: Diagnosis present

## 2014-10-11 DIAGNOSIS — S51811A Laceration without foreign body of right forearm, initial encounter: Secondary | ICD-10-CM | POA: Diagnosis present

## 2014-10-11 DIAGNOSIS — J189 Pneumonia, unspecified organism: Secondary | ICD-10-CM | POA: Diagnosis present

## 2014-10-11 DIAGNOSIS — E785 Hyperlipidemia, unspecified: Secondary | ICD-10-CM | POA: Diagnosis present

## 2014-10-11 DIAGNOSIS — R1314 Dysphagia, pharyngoesophageal phase: Secondary | ICD-10-CM | POA: Diagnosis present

## 2014-10-11 DIAGNOSIS — E43 Unspecified severe protein-calorie malnutrition: Secondary | ICD-10-CM | POA: Diagnosis present

## 2014-10-11 DIAGNOSIS — I5021 Acute systolic (congestive) heart failure: Secondary | ICD-10-CM | POA: Diagnosis present

## 2014-10-11 DIAGNOSIS — J9601 Acute respiratory failure with hypoxia: Secondary | ICD-10-CM | POA: Diagnosis present

## 2014-10-11 DIAGNOSIS — Z681 Body mass index (BMI) 19 or less, adult: Secondary | ICD-10-CM

## 2014-10-11 DIAGNOSIS — I951 Orthostatic hypotension: Secondary | ICD-10-CM | POA: Diagnosis present

## 2014-10-11 DIAGNOSIS — N182 Chronic kidney disease, stage 2 (mild): Secondary | ICD-10-CM | POA: Diagnosis present

## 2014-10-11 DIAGNOSIS — T68XXXA Hypothermia, initial encounter: Secondary | ICD-10-CM | POA: Diagnosis present

## 2014-10-11 DIAGNOSIS — E119 Type 2 diabetes mellitus without complications: Secondary | ICD-10-CM | POA: Diagnosis present

## 2014-10-11 DIAGNOSIS — Z79899 Other long term (current) drug therapy: Secondary | ICD-10-CM

## 2014-10-11 DIAGNOSIS — Z882 Allergy status to sulfonamides status: Secondary | ICD-10-CM

## 2014-10-11 DIAGNOSIS — I2510 Atherosclerotic heart disease of native coronary artery without angina pectoris: Secondary | ICD-10-CM | POA: Diagnosis present

## 2014-10-11 DIAGNOSIS — R918 Other nonspecific abnormal finding of lung field: Secondary | ICD-10-CM

## 2014-10-11 DIAGNOSIS — I429 Cardiomyopathy, unspecified: Secondary | ICD-10-CM | POA: Diagnosis present

## 2014-10-11 DIAGNOSIS — R1310 Dysphagia, unspecified: Secondary | ICD-10-CM | POA: Diagnosis present

## 2014-10-11 DIAGNOSIS — I482 Chronic atrial fibrillation, unspecified: Secondary | ICD-10-CM | POA: Diagnosis present

## 2014-10-11 DIAGNOSIS — A419 Sepsis, unspecified organism: Secondary | ICD-10-CM | POA: Diagnosis present

## 2014-10-11 DIAGNOSIS — Z833 Family history of diabetes mellitus: Secondary | ICD-10-CM | POA: Diagnosis not present

## 2014-10-11 DIAGNOSIS — F0390 Unspecified dementia without behavioral disturbance: Secondary | ICD-10-CM | POA: Diagnosis present

## 2014-10-11 DIAGNOSIS — Z8673 Personal history of transient ischemic attack (TIA), and cerebral infarction without residual deficits: Secondary | ICD-10-CM | POA: Diagnosis not present

## 2014-10-11 DIAGNOSIS — Z8249 Family history of ischemic heart disease and other diseases of the circulatory system: Secondary | ICD-10-CM

## 2014-10-11 DIAGNOSIS — Z955 Presence of coronary angioplasty implant and graft: Secondary | ICD-10-CM | POA: Diagnosis not present

## 2014-10-11 DIAGNOSIS — I5023 Acute on chronic systolic (congestive) heart failure: Secondary | ICD-10-CM | POA: Diagnosis present

## 2014-10-11 DIAGNOSIS — Z66 Do not resuscitate: Secondary | ICD-10-CM | POA: Diagnosis present

## 2014-10-11 DIAGNOSIS — S41112A Laceration without foreign body of left upper arm, initial encounter: Secondary | ICD-10-CM

## 2014-10-11 DIAGNOSIS — W19XXXA Unspecified fall, initial encounter: Secondary | ICD-10-CM | POA: Diagnosis present

## 2014-10-11 DIAGNOSIS — S51812A Laceration without foreign body of left forearm, initial encounter: Secondary | ICD-10-CM | POA: Diagnosis present

## 2014-10-11 DIAGNOSIS — R7989 Other specified abnormal findings of blood chemistry: Secondary | ICD-10-CM

## 2014-10-11 DIAGNOSIS — K219 Gastro-esophageal reflux disease without esophagitis: Secondary | ICD-10-CM | POA: Diagnosis present

## 2014-10-11 DIAGNOSIS — Y95 Nosocomial condition: Secondary | ICD-10-CM | POA: Diagnosis present

## 2014-10-11 DIAGNOSIS — Z950 Presence of cardiac pacemaker: Secondary | ICD-10-CM | POA: Diagnosis not present

## 2014-10-11 DIAGNOSIS — I495 Sick sinus syndrome: Secondary | ICD-10-CM | POA: Diagnosis present

## 2014-10-11 DIAGNOSIS — R4182 Altered mental status, unspecified: Secondary | ICD-10-CM | POA: Diagnosis not present

## 2014-10-11 DIAGNOSIS — S51812D Laceration without foreign body of left forearm, subsequent encounter: Secondary | ICD-10-CM | POA: Diagnosis not present

## 2014-10-11 DIAGNOSIS — Z7902 Long term (current) use of antithrombotics/antiplatelets: Secondary | ICD-10-CM

## 2014-10-11 DIAGNOSIS — I252 Old myocardial infarction: Secondary | ICD-10-CM

## 2014-10-11 DIAGNOSIS — I4891 Unspecified atrial fibrillation: Secondary | ICD-10-CM | POA: Diagnosis present

## 2014-10-11 DIAGNOSIS — Z7901 Long term (current) use of anticoagulants: Secondary | ICD-10-CM | POA: Diagnosis not present

## 2014-10-11 DIAGNOSIS — Z888 Allergy status to other drugs, medicaments and biological substances status: Secondary | ICD-10-CM

## 2014-10-11 DIAGNOSIS — Z8719 Personal history of other diseases of the digestive system: Secondary | ICD-10-CM | POA: Diagnosis not present

## 2014-10-11 DIAGNOSIS — D72819 Decreased white blood cell count, unspecified: Secondary | ICD-10-CM | POA: Diagnosis present

## 2014-10-11 DIAGNOSIS — Z87891 Personal history of nicotine dependence: Secondary | ICD-10-CM

## 2014-10-11 DIAGNOSIS — Y92129 Unspecified place in nursing home as the place of occurrence of the external cause: Secondary | ICD-10-CM | POA: Diagnosis not present

## 2014-10-11 LAB — CBC
HCT: 33.6 % — ABNORMAL LOW (ref 39.0–52.0)
Hemoglobin: 11 g/dL — ABNORMAL LOW (ref 13.0–17.0)
MCH: 28.6 pg (ref 26.0–34.0)
MCHC: 32.7 g/dL (ref 30.0–36.0)
MCV: 87.5 fL (ref 78.0–100.0)
Platelets: 167 10*3/uL (ref 150–400)
RBC: 3.84 MIL/uL — AB (ref 4.22–5.81)
RDW: 16.8 % — ABNORMAL HIGH (ref 11.5–15.5)
WBC: 3.9 10*3/uL — ABNORMAL LOW (ref 4.0–10.5)

## 2014-10-11 LAB — BASIC METABOLIC PANEL
Anion gap: 7 (ref 5–15)
BUN: 25 mg/dL — ABNORMAL HIGH (ref 6–20)
CO2: 28 mmol/L (ref 22–32)
Calcium: 8.4 mg/dL — ABNORMAL LOW (ref 8.9–10.3)
Chloride: 100 mmol/L — ABNORMAL LOW (ref 101–111)
Creatinine, Ser: 1.18 mg/dL (ref 0.61–1.24)
GFR calc Af Amer: >60 mL/min (ref >60)
GFR, EST NON AFRICAN AMERICAN: 52 mL/min — AB (ref >60)
Glucose, Bld: 156 mg/dL — ABNORMAL HIGH (ref 65–99)
Potassium: 4 mmol/L (ref 3.5–5.1)
Sodium: 135 mmol/L (ref 135–145)

## 2014-10-11 LAB — I-STAT TROPONIN, ED: TROPONIN I, POC: 0 ng/mL (ref 0.00–0.08)

## 2014-10-11 LAB — PROTIME-INR
INR: 1.14 (ref 0.00–1.49)
Prothrombin Time: 14.8 seconds (ref 11.6–15.2)

## 2014-10-11 LAB — URINALYSIS, ROUTINE W REFLEX MICROSCOPIC
Bilirubin Urine: NEGATIVE
Glucose, UA: NEGATIVE mg/dL
Hgb urine dipstick: NEGATIVE
Ketones, ur: NEGATIVE mg/dL
LEUKOCYTES UA: NEGATIVE
NITRITE: NEGATIVE
PROTEIN: NEGATIVE mg/dL
Specific Gravity, Urine: 1.012 (ref 1.005–1.030)
UROBILINOGEN UA: 1 mg/dL (ref 0.0–1.0)
pH: 7.5 (ref 5.0–8.0)

## 2014-10-11 LAB — I-STAT CG4 LACTIC ACID, ED
LACTIC ACID, VENOUS: 0.63 mmol/L (ref 0.5–2.0)
Lactic Acid, Venous: 3.61 mmol/L (ref 0.5–2.0)

## 2014-10-11 LAB — EXPECTORATED SPUTUM ASSESSMENT W GRAM STAIN, RFLX TO RESP C

## 2014-10-11 LAB — EXPECTORATED SPUTUM ASSESSMENT W REFEX TO RESP CULTURE

## 2014-10-11 MED ORDER — VANCOMYCIN HCL IN DEXTROSE 1-5 GM/200ML-% IV SOLN
1000.0000 mg | Freq: Once | INTRAVENOUS | Status: AC
  Administered 2014-10-11: 1000 mg via INTRAVENOUS
  Filled 2014-10-11: qty 200

## 2014-10-11 MED ORDER — VANCOMYCIN HCL IN DEXTROSE 750-5 MG/150ML-% IV SOLN
750.0000 mg | INTRAVENOUS | Status: DC
  Administered 2014-10-12: 750 mg via INTRAVENOUS
  Filled 2014-10-11 (×2): qty 150

## 2014-10-11 MED ORDER — FLUDROCORTISONE ACETATE 0.1 MG PO TABS
0.1000 mg | ORAL_TABLET | Freq: Two times a day (BID) | ORAL | Status: DC
  Administered 2014-10-11 – 2014-10-13 (×3): 0.1 mg via ORAL
  Filled 2014-10-11 (×5): qty 1

## 2014-10-11 MED ORDER — ACETAMINOPHEN 325 MG PO TABS
650.0000 mg | ORAL_TABLET | Freq: Three times a day (TID) | ORAL | Status: DC
  Administered 2014-10-11 – 2014-10-13 (×3): 650 mg via ORAL
  Filled 2014-10-11 (×7): qty 2

## 2014-10-11 MED ORDER — SODIUM CHLORIDE 0.9 % IV SOLN: 1000.0000 mL | Freq: Once | INTRAVENOUS | Status: DC

## 2014-10-11 MED ORDER — PIPERACILLIN-TAZOBACTAM 3.375 G IVPB
3.3750 g | Freq: Three times a day (TID) | INTRAVENOUS | Status: DC
  Administered 2014-10-12 – 2014-10-13 (×5): 3.375 g via INTRAVENOUS
  Filled 2014-10-11 (×6): qty 50

## 2014-10-11 MED ORDER — POTASSIUM CHLORIDE 20 MEQ/15ML (10%) PO SOLN
20.0000 meq | Freq: Every day | ORAL | Status: DC
  Administered 2014-10-12 – 2014-10-13 (×2): 20 meq via ORAL
  Filled 2014-10-11 (×2): qty 15

## 2014-10-11 MED ORDER — LEVALBUTEROL HCL 0.63 MG/3ML IN NEBU: 0.6300 mg | INHALATION_SOLUTION | Freq: Four times a day (QID) | RESPIRATORY_TRACT | Status: DC | PRN

## 2014-10-11 MED ORDER — LATANOPROST 0.005 % OP SOLN
1.0000 [drp] | Freq: Every day | OPHTHALMIC | Status: DC
  Administered 2014-10-11 – 2014-10-12 (×2): 1 [drp] via OPHTHALMIC
  Filled 2014-10-11: qty 2.5

## 2014-10-11 MED ORDER — TRAZODONE HCL 50 MG PO TABS: 50.0000 mg | ORAL_TABLET | Freq: Every evening | ORAL | Status: DC | PRN

## 2014-10-11 MED ORDER — FUROSEMIDE 20 MG PO TABS
20.0000 mg | ORAL_TABLET | Freq: Every day | ORAL | Status: DC
  Filled 2014-10-11: qty 1

## 2014-10-11 MED ORDER — VANCOMYCIN HCL IN DEXTROSE 750-5 MG/150ML-% IV SOLN: 750.0000 mg | Freq: Two times a day (BID) | INTRAVENOUS | Status: DC

## 2014-10-11 MED ORDER — PIPERACILLIN-TAZOBACTAM 3.375 G IVPB 30 MIN
3.3750 g | Freq: Once | INTRAVENOUS | Status: AC
  Administered 2014-10-11: 3.375 g via INTRAVENOUS
  Filled 2014-10-11: qty 50

## 2014-10-11 MED ORDER — LIDOCAINE 5 % EX OINT
TOPICAL_OINTMENT | Freq: Once | CUTANEOUS | Status: AC
  Administered 2014-10-11: 22:00:00 via TOPICAL
  Filled 2014-10-11: qty 35.44

## 2014-10-11 MED ORDER — SODIUM CHLORIDE 0.9 % IV SOLN
1000.0000 mL | Freq: Once | INTRAVENOUS | Status: AC
  Administered 2014-10-11: 1000 mL via INTRAVENOUS

## 2014-10-11 MED ORDER — SODIUM CHLORIDE 0.9 % IV SOLN
1000.0000 mL | INTRAVENOUS | Status: DC
  Administered 2014-10-11: 1000 mL via INTRAVENOUS

## 2014-10-11 MED ORDER — METOPROLOL TARTRATE 25 MG PO TABS
25.0000 mg | ORAL_TABLET | Freq: Two times a day (BID) | ORAL | Status: DC
  Administered 2014-10-11 – 2014-10-12 (×2): 25 mg via ORAL
  Filled 2014-10-11 (×3): qty 1

## 2014-10-11 MED ORDER — ALBUTEROL SULFATE (2.5 MG/3ML) 0.083% IN NEBU: 2.5000 mg | INHALATION_SOLUTION | RESPIRATORY_TRACT | Status: DC | PRN

## 2014-10-11 MED ORDER — BRIMONIDINE TARTRATE 0.2 % OP SOLN
1.0000 [drp] | Freq: Two times a day (BID) | OPHTHALMIC | Status: DC
  Administered 2014-10-11 – 2014-10-13 (×4): 1 [drp] via OPHTHALMIC
  Filled 2014-10-11: qty 5

## 2014-10-11 MED ORDER — FAMOTIDINE 20 MG PO TABS
20.0000 mg | ORAL_TABLET | Freq: Every day | ORAL | Status: DC
  Administered 2014-10-11: 20 mg via ORAL
  Filled 2014-10-11 (×3): qty 1

## 2014-10-11 MED ORDER — DIGOXIN 250 MCG PO TABS
250.0000 ug | ORAL_TABLET | Freq: Every day | ORAL | Status: DC
  Administered 2014-10-12: 250 ug via ORAL
  Filled 2014-10-11: qty 1

## 2014-10-11 MED ORDER — ATORVASTATIN CALCIUM 10 MG PO TABS
10.0000 mg | ORAL_TABLET | Freq: Every day | ORAL | Status: DC
  Administered 2014-10-12 – 2014-10-13 (×2): 10 mg via ORAL
  Filled 2014-10-11 (×2): qty 1

## 2014-10-11 MED ORDER — ENSURE ENLIVE PO LIQD
237.0000 mL | Freq: Two times a day (BID) | ORAL | Status: DC
  Administered 2014-10-12 – 2014-10-13 (×2): 237 mL via ORAL

## 2014-10-11 MED ORDER — VENLAFAXINE HCL 37.5 MG PO TABS
37.5000 mg | ORAL_TABLET | Freq: Every day | ORAL | Status: DC
Start: 2014-10-12 — End: 2014-10-13
  Administered 2014-10-12 – 2014-10-13 (×2): 37.5 mg via ORAL
  Filled 2014-10-11 (×2): qty 1

## 2014-10-11 MED ORDER — PIPERACILLIN-TAZOBACTAM 3.375 G IVPB 30 MIN: 3.3750 g | Freq: Three times a day (TID) | INTRAVENOUS | Status: DC

## 2014-10-11 NOTE — ED Notes (Signed)
Pt BIB EMS. [facility]. Pt was in lunch room and began to fall to ground. Pt was assisted by staff to floor. Pt has skin tear to R elbow and L arm. Pt is on blood thinners and has pressure dressing to wounds. Pt arrives with cyanotic hands bilateral. Radial pulses intact. Hands cool to touch. Cap refill > 2 sec. MD aware. Pt denies pain.

## 2014-10-11 NOTE — ED Notes (Signed)
Patient transported to X-ray 

## 2014-10-11 NOTE — ED Notes (Signed)
Bed: [id number] Expected date: 10/11/14 Expected time: 4:34 PM Means of arrival: Ambulance Comments: Fall, skin tear bleeding uncontrolled.

## 2014-10-11 NOTE — ED Notes (Signed)
MD at bedside. Dr. Gwendolyn GrantWalden at bedside. Dressings removed from arms bilat. Pt has large skin tear to entire L forearm. Bleeding controlled. After removal of dressing to L forearm, cap refill improved and redness to L hand decreased.

## 2014-10-11 NOTE — Progress Notes (Signed)
ANTIBIOTIC CONSULT NOTE - INITIAL  Pharmacy Consult for Vancomycin Indication: Sepsis  Allergies  Allergen Reactions  . Amiodarone Hcl Rash  . Sulfonamide Derivatives Hives    Patient Measurements: Weight: 123 lb 0.3 oz (55.8 kg)  Vital Signs: Temp: 97.5 F (36.4 C) (07/16 2220) Temp Source: Oral (07/16 2220) BP: 110/71 mmHg (07/16 2220) Pulse Rate: 91 (07/16 2220) Intake/Output from previous day:   Intake/Output from this shift: Total I/O In: 2000 [I.V.:2000] Out: -   Labs:  Recent Labs  10/11/14 1730  WBC 3.9*  HGB 11.0*  PLT 167  CREATININE 1.18   Estimated Creatinine Clearance: 32.8 mL/min (by C-G formula based on Cr of 1.18). No results for input(s): VANCOTROUGH, VANCOPEAK, VANCORANDOM, GENTTROUGH, GENTPEAK, GENTRANDOM, TOBRATROUGH, TOBRAPEAK, TOBRARND, AMIKACINPEAK, AMIKACINTROU, AMIKACIN in the last 72 hours.   Microbiology: No results found for this or any previous visit (from the past 720 hour(s)).  Medical History: Past Medical History  Diagnosis Date  . Cardiac pacemaker in situ     s/p Viatron DDD Pacemaker for sinus node dysfunction  . Orthostatic hypotension   . Hyperlipidemia     mixed  . Atrial fibrillation     not felt to be a coumadin candidate due to falls  . Sinoatrial node dysfunction     s/p PPM (MDT)  . Coronary atherosclerosis of native coronary artery     s/p AWMI 2005 stent LAD  . Diabetes mellitus, type 2   . Chronic renal insufficiency, stage II (mild)   . Intolerance, drug     amniodarone  . Upper GI hemorrhage     2006 Mallory Weiss Tear  . MI (myocardial infarction)     coronary artery status post anterior wall myocardial infarction in 2005 treated with a stent to the left anterior descending.  . Dizziness     recurrent dizziness and falls  . Diabetes mellitus   . Hyperlipidemia   . Renal insufficiency     mild  . Dementia   . Depression   . CHF (congestive heart failure)   . GERD (gastroesophageal reflux  disease)     Medications:  Scheduled:  . acetaminophen  650 mg Oral 3 times per day  . [START ON 10/12/2014] atorvastatin  10 mg Oral Daily  . brimonidine  1 drop Both Eyes BID  . [START ON 10/12/2014] digoxin  250 mcg Oral Daily  . famotidine  20 mg Oral QHS  . [START ON 10/12/2014] feeding supplement (ENSURE ENLIVE)  237 mL Oral BID PC  . fludrocortisone  0.1 mg Oral BID  . [START ON 10/12/2014] furosemide  20 mg Oral Daily  . latanoprost  1 drop Both Eyes QHS  . metoprolol tartrate  25 mg Oral BID  . [START ON 10/12/2014] piperacillin-tazobactam (ZOSYN)  IV  3.375 g Intravenous Q8H  . [START ON 10/12/2014] potassium chloride  20 mEq Oral Daily  . [START ON 10/12/2014] vancomycin  750 mg Intravenous Q24H  . [START ON 10/12/2014] venlafaxine  37.5 mg Oral Daily   Infusions:   Assessment:  81 yr male brought to ED s/p fall and skin tear to elbow and arm. Bleeding under control.  INR = 1.14.  Patient per med rec on Plavix.   Noted hypothermia, elevated lactic acid  Pharmacy consulted to dose Vancomycin for suspected sepsis and to adjust other antibiotics as needed for renal function  CrCl = 32 ml/min  Blood and sputum cultures ordered  Patient received initial Vancomycin 1gm IV x 1 @ 20:46  today  Goal of Therapy:  Vancomycin trough level 15-20 mcg/ml  Plan:  Measure antibiotic drug levels at steady state Follow up culture results  Continue Vancomycin @ 750mg  IV q24h Continue Zosyn 3.375gm IV q8h, as ordered by MD (zosyn to be given as extended infusion)  Moosa Bueche, Joselyn GlassmanLeann Trefz, PharmD 10/11/2014,10:49 PM

## 2014-10-11 NOTE — ED Provider Notes (Signed)
CSN: [id number]     Arrival date & time 10/11/14  1638 History   First MD Initiated Contact with Patient 10/11/14 1644     Chief Complaint  Patient presents with  . Fall  . Skin Tear      (Consider location/radiation/quality/duration/timing/severity/associated sxs/prior Treatment) Patient is a 81 y.o. male presenting with fall. The history is provided by the patient.  Fall This is a new problem. The current episode started 1 to 2 hours ago. The problem occurs constantly. The problem has not changed since onset.Pertinent negatives include no chest pain and no abdominal pain. Nothing aggravates the symptoms. Nothing relieves the symptoms.    Past Medical History  Diagnosis Date  . Cardiac pacemaker in situ     s/p Viatron DDD Pacemaker for sinus node dysfunction  . Orthostatic hypotension   . Hyperlipidemia     mixed  . Atrial fibrillation     not felt to be a coumadin candidate due to falls  . Sinoatrial node dysfunction     s/p PPM (MDT)  . Coronary atherosclerosis of native coronary artery     s/p AWMI 2005 stent LAD  . Diabetes mellitus, type 2   . Chronic renal insufficiency, stage II (mild)   . Intolerance, drug     amniodarone  . Upper GI hemorrhage     2006 Mallory Weiss Tear  . MI (myocardial infarction)     coronary artery status post anterior wall myocardial infarction in 2005 treated with a stent to the left anterior descending.  . Dizziness     recurrent dizziness and falls  . Diabetes mellitus   . Hyperlipidemia   . Renal insufficiency     mild  . Dementia   . Depression   . CHF (congestive heart failure)   . GERD (gastroesophageal reflux disease)    Past Surgical History  Procedure Laterality Date  . Pacemaker placement    . Coronary stent placement     Family History  Problem Relation Age of Onset  . Coronary artery disease Brother   . Diabetes Mother    History  Substance Use Topics  . Smoking status: Former Smoker    Quit date: 03/28/1960   . Smokeless tobacco: Not on file  . Alcohol Use: No    Review of Systems  Unable to perform ROS: Dementia  Cardiovascular: Negative for chest pain.  Gastrointestinal: Negative for abdominal pain.      Allergies  Amiodarone hcl and Sulfonamide derivatives  Home Medications   Prior to Admission medications   Medication Sig Start Date End Date Taking? Authorizing Provider  acetaminophen (TYLENOL) 650 MG CR tablet Take 650 mg by mouth every 8 (eight) hours as needed for pain.   Yes Historical Provider, MD  albuterol (PROVENTIL) (2.5 MG/3ML) 0.083% nebulizer solution Take 2.5 mg by nebulization every 4 (four) hours as needed for wheezing or shortness of breath.   Yes Historical Provider, MD  atorvastatin (LIPITOR) 10 MG tablet Take 10 mg by mouth every morning.    Yes Historical Provider, MD  beta carotene w/minerals (OCUVITE) tablet Take 1 tablet by mouth daily.   Yes Historical Provider, MD  brimonidine (ALPHAGAN) 0.2 % ophthalmic solution Place 1 drop into both eyes 2 (two) times daily. Wait 3-5 minutes between each drop   Yes Historical Provider, MD  clopidogrel (PLAVIX) 75 MG tablet Take 75 mg by mouth daily.   Yes Historical Provider, MD  Cranberry 250 MG CAPS Take 500 mg by mouth 2 (two)  times daily.   Yes Historical Provider, MD  dextromethorphan (DELSYM) 30 MG/5ML liquid Take 5 mLs by mouth 2 (two) times daily as needed for cough.    Yes Historical Provider, MD  digoxin (LANOXIN) 0.25 MG tablet Take 250 mcg by mouth every morning. *HOLD IF HEART RATE IS LESS THAN 60*   Yes Historical Provider, MD  Emollient (MINERIN) LOTN Apply 1 application topically at bedtime. Applies to feet   Yes Historical Provider, MD  feeding supplement, ENSURE ENLIVE, (ENSURE ENLIVE) LIQD Take 237 mLs by mouth 2 (two) times daily after a meal. 07/04/14  Yes Kathlen Mody, MD  feeding supplement, ENSURE, (ENSURE) PUDG Take 1 Container by mouth daily. 07/04/14  Yes Kathlen Mody, MD  fludrocortisone (FLORINEF)  0.1 MG tablet Take 0.1 mg by mouth 2 (two) times daily.    Yes Historical Provider, MD  furosemide (LASIX) 20 MG tablet Take 20 mg by mouth every morning.    Yes Historical Provider, MD  latanoprost (XALATAN) 0.005 % ophthalmic solution Place 1 drop into both eyes every evening. At 8 pm   Yes Historical Provider, MD  metoprolol tartrate (LOPRESSOR) 25 MG tablet Take 25 mg by mouth 2 (two) times daily.   Yes Historical Provider, MD  Multiple Vitamins-Minerals (THEREMS M PO) Take 1 tablet by mouth every morning.   Yes Historical Provider, MD  Polyethyl Glycol-Propyl Glycol (SYSTANE ULTRA) 0.4-0.3 % SOLN Place 1 drop into both eyes 4 (four) times daily.   Yes Historical Provider, MD  potassium chloride (KLOR-CON) 20 MEQ packet Take 20 mEq by mouth every morning. Mix with 4-6 ounces of water   Yes Historical Provider, MD  ranitidine (ZANTAC) 150 MG tablet Take 150 mg by mouth at bedtime.   Yes Historical Provider, MD  sodium chloride (OCEAN) 0.65 % SOLN nasal spray Place 2 sprays into both nostrils 2 (two) times daily.   Yes Historical Provider, MD  traZODone (DESYREL) 50 MG tablet Take 50 mg by mouth at bedtime as needed for sleep.   Yes Historical Provider, MD  venlafaxine (EFFEXOR) 37.5 MG tablet Take 37.5 mg by mouth every morning.    Yes Historical Provider, MD   BP 134/71 mmHg  Pulse 76  Resp 16  SpO2 92% Physical Exam  Constitutional: He is oriented to person, place, and time. He appears well-developed and well-nourished. No distress.  HENT:  Head: Normocephalic and atraumatic.  Mouth/Throat: No oropharyngeal exudate.  Eyes: EOM are normal. Pupils are equal, round, and reactive to light.  Neck: Normal range of motion. Neck supple.  Cardiovascular: Normal rate and regular rhythm.  Exam reveals no friction rub.   No murmur heard. Pulmonary/Chest: Effort normal and breath sounds normal. No respiratory distress. He has no wheezes. He has no rales.  Abdominal: He exhibits no distension. There  is no tenderness. There is no rebound.  Musculoskeletal: Normal range of motion. He exhibits no edema.  Skin tear to left forearm, over 50% of circumference, starting in proximal L forearm and going down to mid forearm.    Neurological: He is alert and oriented to person, place, and time.  Skin: No rash noted. He is not diaphoretic.  Nursing note and vitals reviewed.   ED Course  Procedures (including critical care time) Labs Review Labs Reviewed  CBC - Abnormal; Notable for the following:    WBC 3.9 (*)    RBC 3.84 (*)    Hemoglobin 11.0 (*)    HCT 33.6 (*)    RDW 16.8 (*)  All other components within normal limits  BASIC METABOLIC PANEL - Abnormal; Notable for the following:    Chloride 100 (*)    Glucose, Bld 156 (*)    BUN 25 (*)    Calcium 8.4 (*)    GFR calc non Af Amer 52 (*)    All other components within normal limits  I-STAT CG4 LACTIC ACID, ED - Abnormal; Notable for the following:    Lactic Acid, Venous 3.61 (*)    All other components within normal limits  CULTURE, BLOOD (ROUTINE X 2)  CULTURE, BLOOD (ROUTINE X 2)  CULTURE, BLOOD (ROUTINE X 2)  CULTURE, BLOOD (ROUTINE X 2)  CULTURE, EXPECTORATED SPUTUM-ASSESSMENT  GRAM STAIN  MRSA PCR SCREENING  URINALYSIS, ROUTINE W REFLEX MICROSCOPIC (NOT AT [facility])  PROTIME-INR  COMPREHENSIVE METABOLIC PANEL  CBC  HIV ANTIBODY (ROUTINE TESTING)  LEGIONELLA ANTIGEN, URINE  STREP PNEUMONIAE URINARY ANTIGEN  I-STAT TROPOININ, ED  I-STAT CG4 LACTIC ACID, ED    Imaging Review Dg Chest 2 View  10/11/2014   CLINICAL DATA:  Fall  EXAM: CHEST  2 VIEW  COMPARISON:  07/01/2014  FINDINGS: Moderate left pleural effusion.  Small right pleural effusion.  Associated bilateral lower lobe opacities, likely atelectasis.  No pneumothorax.  The heart is top-normal in size.  Left subclavian pacemaker.  No frank interstitial edema.  IMPRESSION: Moderate left and small right pleural effusions.  Associated bilateral lower lobe opacities,  likely atelectasis.  No frank interstitial edema.   Electronically Signed   By: Charline Bills M.D.   On: 10/11/2014 19:04     EKG Interpretation   Date/Time:  Saturday October 11 2014 17:43:02 EDT Ventricular Rate:  71 PR Interval:    QRS Duration: 94 QT Interval:  362 QTC Calculation: 393 R Axis:   -40 Text Interpretation:  Pacemaker spikes or artifacts Atrial fibrillation  Inferior infarct, old Probable anterior infarct, age indeterminate No  significant change since last tracing Confirmed by [facility]  MD, Kuuipo Anzaldo  [phone]) on 10/11/2014 10:35:25 PM      MDM   Final diagnoses:  Elevated lactic acid level  Hypothermia, initial encounter  Skin tear of left upper arm without complication, initial encounter  Lung infiltrate    20M here after a fall. He was assisted by staff at the nursing home to the ground. He obtained a skin tear to bilateral forearms, L forearm extensive. He is on coumadin. He is currently on an antibiotic for pneumonia. He got weak during a transfer and fell to the ground. He was assisted by a staff member and his arms went through her hands. Here forearms wrapped up in gauze. Hands are cyanotic. He has proximal hand ecchymosis, he is on coumadin, however fingertips are cyantoic with delayed cap refill. No breathing difficulties, no CP. He is demented, so history is difficult to obtain.   Rectal temp is low at 95.5. Patient is also getting intermittent hypoxic. Bair hugger applied for active rewarming. He is a DO NOT RESUSCITATE Lactate is elevated at 3.6. Concern for sepsis, broad-spectrum antibiotics given.   Admitted to medicine for concerns of sepsis.  Elwin Mocha, MD 10/11/14 [phone]

## 2014-10-11 NOTE — ED Notes (Signed)
Bair Hugger applied

## 2014-10-11 NOTE — ED Notes (Signed)
Made MD Gwendolyn GrantWalden aware of the I stat Lactic

## 2014-10-12 ENCOUNTER — Encounter (HOSPITAL_COMMUNITY): Payer: Self-pay

## 2014-10-12 DIAGNOSIS — F0390 Unspecified dementia without behavioral disturbance: Secondary | ICD-10-CM | POA: Diagnosis present

## 2014-10-12 DIAGNOSIS — S51811A Laceration without foreign body of right forearm, initial encounter: Secondary | ICD-10-CM | POA: Diagnosis present

## 2014-10-12 DIAGNOSIS — J189 Pneumonia, unspecified organism: Secondary | ICD-10-CM

## 2014-10-12 DIAGNOSIS — R1314 Dysphagia, pharyngoesophageal phase: Secondary | ICD-10-CM

## 2014-10-12 DIAGNOSIS — I5023 Acute on chronic systolic (congestive) heart failure: Secondary | ICD-10-CM

## 2014-10-12 DIAGNOSIS — S51812D Laceration without foreign body of left forearm, subsequent encounter: Secondary | ICD-10-CM

## 2014-10-12 LAB — CBC
HEMATOCRIT: 30.2 % — AB (ref 39.0–52.0)
HEMOGLOBIN: 9.6 g/dL — AB (ref 13.0–17.0)
MCH: 28 pg (ref 26.0–34.0)
MCHC: 31.8 g/dL (ref 30.0–36.0)
MCV: 88 fL (ref 78.0–100.0)
Platelets: 147 10*3/uL — ABNORMAL LOW (ref 150–400)
RBC: 3.43 MIL/uL — ABNORMAL LOW (ref 4.22–5.81)
RDW: 16.8 % — AB (ref 11.5–15.5)
WBC: 3.2 10*3/uL — AB (ref 4.0–10.5)

## 2014-10-12 LAB — MRSA PCR SCREENING: MRSA by PCR: POSITIVE — AB

## 2014-10-12 LAB — HIV ANTIBODY (ROUTINE TESTING W REFLEX): HIV Screen 4th Generation wRfx: NONREACTIVE

## 2014-10-12 LAB — COMPREHENSIVE METABOLIC PANEL
ALT: 26 U/L (ref 17–63)
AST: 30 U/L (ref 15–41)
Albumin: 2.7 g/dL — ABNORMAL LOW (ref 3.5–5.0)
Alkaline Phosphatase: 96 U/L (ref 38–126)
Anion gap: 6 (ref 5–15)
BILIRUBIN TOTAL: 1.7 mg/dL — AB (ref 0.3–1.2)
BUN: 21 mg/dL — ABNORMAL HIGH (ref 6–20)
CHLORIDE: 106 mmol/L (ref 101–111)
CO2: 26 mmol/L (ref 22–32)
CREATININE: 1.03 mg/dL (ref 0.61–1.24)
Calcium: 7.9 mg/dL — ABNORMAL LOW (ref 8.9–10.3)
GFR calc Af Amer: >60 mL/min (ref >60)
GFR calc non Af Amer: >60 mL/min (ref >60)
Glucose, Bld: 83 mg/dL (ref 65–99)
POTASSIUM: 3.6 mmol/L (ref 3.5–5.1)
Sodium: 138 mmol/L (ref 135–145)
Total Protein: 5.7 g/dL — ABNORMAL LOW (ref 6.5–8.1)

## 2014-10-12 LAB — STREP PNEUMONIAE URINARY ANTIGEN: STREP PNEUMO URINARY ANTIGEN: NEGATIVE

## 2014-10-12 LAB — BRAIN NATRIURETIC PEPTIDE: B Natriuretic Peptide: 795.5 pg/mL — ABNORMAL HIGH (ref 0.0–100.0)

## 2014-10-12 MED ORDER — MUPIROCIN 2 % EX OINT
TOPICAL_OINTMENT | Freq: Two times a day (BID) | CUTANEOUS | Status: DC
  Administered 2014-10-12 – 2014-10-13 (×3): via NASAL
  Filled 2014-10-12: qty 22

## 2014-10-12 MED ORDER — METOPROLOL TARTRATE 1 MG/ML IV SOLN: 5.0000 mg | Freq: Three times a day (TID) | INTRAVENOUS | Status: DC | PRN

## 2014-10-12 MED ORDER — DIGOXIN 0.25 MG/ML IJ SOLN
0.2500 mg | Freq: Every day | INTRAMUSCULAR | Status: DC
  Filled 2014-10-12 (×2): qty 1

## 2014-10-12 MED ORDER — FUROSEMIDE 20 MG PO TABS
20.0000 mg | ORAL_TABLET | Freq: Two times a day (BID) | ORAL | Status: DC
  Administered 2014-10-12: 20 mg via ORAL
  Filled 2014-10-12 (×3): qty 1

## 2014-10-12 MED ORDER — MORPHINE SULFATE 2 MG/ML IJ SOLN
1.0000 mg | Freq: Once | INTRAMUSCULAR | Status: AC
  Administered 2014-10-12: 1 mg via INTRAVENOUS
  Filled 2014-10-12: qty 1

## 2014-10-12 MED ORDER — CETYLPYRIDINIUM CHLORIDE 0.05 % MT LIQD
7.0000 mL | Freq: Two times a day (BID) | OROMUCOSAL | Status: DC
  Administered 2014-10-13: 7 mL via OROMUCOSAL

## 2014-10-12 MED ORDER — FUROSEMIDE 10 MG/ML IJ SOLN
20.0000 mg | Freq: Every day | INTRAMUSCULAR | Status: DC
  Administered 2014-10-12 – 2014-10-13 (×2): 20 mg via INTRAVENOUS
  Filled 2014-10-12 (×2): qty 2

## 2014-10-12 MED ORDER — CHLORHEXIDINE GLUCONATE CLOTH 2 % EX PADS
6.0000 | MEDICATED_PAD | Freq: Every day | CUTANEOUS | Status: DC
  Administered 2014-10-12 – 2014-10-13 (×2): 6 via TOPICAL

## 2014-10-12 NOTE — Progress Notes (Signed)
[facility] PROGRESS NOTE  [name] [name] ZOX:[id number] DOB: 1922/09/21 DOA: 10/11/2014 PCP: Georgann Housekeeper, MD   81 year old male with history of A. fib not on anticoagulation due to falls, CAD with history of MI status post stent, cardiomyopathy, cardiac pacemaker, sick sinus syndrome status post pacemaker, type 2 diabetes mellitus, chronic kidney disease stage II, severe dementia brought from nursing home with fall and sustained a significant laceration on his left forearm. Patient was recently diagnosed of pneumonia at the skilled nursing facility and was being treated with Augmentin. Patient was hypothermic and dissected in the ED with chest x-ray showing possible infiltrate with bilateral pleural effusion. Patient admitted for healthcare associated pneumonia.   Assessment/Plan: Acute hypoxic respiratory failure secondary to healthcare associated pneumonia and possible CHF On empiric IV vancomycin and Zosyn. Follow blood culture, urine for strep antigen and Legionella antigen. Supportive care with Tylenol and O2 via nasal cannula. Continue Xopenex nebs when necessary  Mild sepsis secondary to pneumonia Patient presented with hypothermia, leukopenia and elevated lactic acid. Now resolved. Still has mild leukopenia.  Fall with left forearm laceration Continue with dry dressing. No bleeding noted. Hold Plavix. Wound care and PT evaluation.  Acute systolic CHF Mild symptoms. Will increase Lasix dose to 20 mg twice daily. Continue metoprolol, digoxin and statin. Monitor I/O. Check digoxin level.  Coronary artery disease and history of stroke On Plavix which is one hold severe left forearm laceration.  Depression Continue Effexor and trazodone.  History of A. fib Rate controlled. On beta blocker. Not a candidate for Coumadin due to fall risk.  Dysphagia Patient reportedly coughing even upon swallowing water. On dysphagia diet at facility. Will keep him nothing by mouth and have  swallow evaluation in the morning.  DVT prophylaxis: SCDs Code Status: DO NOT RESUSCITATE Family Communication: discussed plan with daughter at bedside Disposition Plan: Return to skilled nursing facility possibly in-2 days   Consultants:  none  Procedures:  none  Antibiotics:  IV vanco and zosyn  HPI/Subjective: Pt seen and examined . Admission H&P reviewed.  Objective: Filed Vitals:   10/12/14 1503  BP: 128/54  Pulse: 60  Temp: 97.9 F (36.6 C)  Resp: 18    Intake/Output Summary (Last 24 hours) at 10/12/14 1713 Last data filed at 10/12/14 1300  Gross per 24 hour  Intake   2000 ml  Output   1450 ml  Net    550 ml   Filed Weights   10/11/14 2220  Weight: 55.8 kg (123 lb 0.3 oz)    Exam:   General:  Elderly and built male lying in bed in no acute distress  HEENT: Pallor present, moist oral mucosa, supple neck  Chest: Diminished bibasilar breath sounds, no rhonchi or wheeze  CVS: S1 and S2 is irregularly irregular, no murmurs rub or gallop  VI: Soft, nondistended, nontender, bowel sounds present  Musculoskeletal: Laceration over left forearm with significant bruising and cyanosis extending to his left fingers  CNS: Alert and oriented partially to place and person  Data Reviewed: Basic Metabolic Panel:  Recent Labs Lab 10/11/14 1730 10/12/14 0513  NA 135 138  K 4.0 3.6  CL 100* 106  CO2 28 26  GLUCOSE 156* 83  BUN 25* 21*  CREATININE 1.18 1.03  CALCIUM 8.4* 7.9*   Liver Function Tests:  Recent Labs Lab 10/12/14 0513  AST 30  ALT 26  ALKPHOS 96  BILITOT 1.7*  PROT 5.7*  ALBUMIN 2.7*   No results for input(s): LIPASE, AMYLASE in the  last 168 hours. No results for input(s): AMMONIA in the last 168 hours. CBC:  Recent Labs Lab 10/11/14 1730 10/12/14 0513  WBC 3.9* 3.2*  HGB 11.0* 9.6*  HCT 33.6* 30.2*  MCV 87.5 88.0  PLT 167 147*   Cardiac Enzymes: No results for input(s): CKTOTAL, CKMB, CKMBINDEX, TROPONINI in the last  168 hours. BNP (last 3 results)  Recent Labs  07/01/14 2100 07/03/14 0529 10/12/14 0513  BNP 1697.0* 699.0* 795.5*    ProBNP (last 3 results)  Recent Labs  12/28/13 0700  PROBNP 14620.0*    CBG: No results for input(s): GLUCAP in the last 168 hours.  Recent Results (from the past 240 hour(s))  Culture, blood (routine x 2)     Status: None (Preliminary result)   Collection Time: 10/11/14  6:00 PM  Result Value Ref Range Status   Specimen Description BLOOD RIGHT FOREARM  Final   Special Requests   Final    BOTTLES DRAWN AEROBIC AND ANAEROBIC 4CC BLUE 2CC RED [facility]    Culture PENDING  Incomplete   Report Status PENDING  Incomplete  MRSA PCR Screening     Status: Abnormal   Collection Time: 10/11/14 10:26 PM  Result Value Ref Range Status   MRSA by PCR POSITIVE (A) NEGATIVE Final    Comment:        The GeneXpert MRSA Assay (FDA approved for NASAL specimens only), is one component of a comprehensive MRSA colonization surveillance program. It is not intended to diagnose MRSA infection nor to guide or monitor treatment for MRSA infections. RESULT CALLED TO, READ BACK BY AND VERIFIED WITH: V.JACKSON,RN AT 0048 ON 10/12/14 BY W.SHEA   Culture, sputum-assessment     Status: None   Collection Time: 10/11/14 10:39 PM  Result Value Ref Range Status   Specimen Description SPUTUM  Final   Special Requests NONE  Final   Sputum evaluation   Final    THIS SPECIMEN IS ACCEPTABLE. RESPIRATORY CULTURE REPORT TO FOLLOW.   Report Status 10/11/2014 FINAL  Final     Studies: Dg Chest 2 View  10/11/2014   CLINICAL DATA:  Fall  EXAM: CHEST  2 VIEW  COMPARISON:  07/01/2014  FINDINGS: Moderate left pleural effusion.  Small right pleural effusion.  Associated bilateral lower lobe opacities, likely atelectasis.  No pneumothorax.  The heart is top-normal in size.  Left subclavian pacemaker.  No frank interstitial edema.  IMPRESSION: Moderate left and small  right pleural effusions.  Associated bilateral lower lobe opacities, likely atelectasis.  No frank interstitial edema.   Electronically Signed   By: Charline BillsSriyesh  Krishnan M.D.   On: 10/11/2014 19:04    Scheduled Meds: . acetaminophen  650 mg Oral 3 times per day  . [START ON 10/13/2014] antiseptic oral rinse  7 mL Mouth Rinse BID  . atorvastatin  10 mg Oral Daily  . brimonidine  1 drop Both Eyes BID  . Chlorhexidine Gluconate Cloth  6 each Topical Q0600  . digoxin  250 mcg Oral Daily  . famotidine  20 mg Oral QHS  . feeding supplement (ENSURE ENLIVE)  237 mL Oral BID PC  . fludrocortisone  0.1 mg Oral BID  . furosemide  20 mg Oral BID  . latanoprost  1 drop Both Eyes QHS  . metoprolol tartrate  25 mg Oral BID  . mupirocin ointment   Nasal BID  . piperacillin-tazobactam (ZOSYN)  IV  3.375 g Intravenous Q8H  . potassium chloride  20 mEq  Oral Daily  . vancomycin  750 mg Intravenous Q24H  . venlafaxine  37.5 mg Oral Daily   Continuous Infusions:     Time spent: 35 minutes    Khristen Cheyney  [facility] Pager [phone] If 7PM-7AM, please contact night-coverage at [email or web address], password [facility] 10/12/2014, 5:13 PM  LOS: 1 day

## 2014-10-12 NOTE — H&P (Signed)
[facility] History and Physical  [name] NWG:[id number] DOB: 04-25-1922 DOA: 10/11/2014  Referring physician: Elwin Mocha, M.D. PCP: Georgann Housekeeper, MD   Chief Complaint: Fall and worsening mental status  HPI: [name] [name] is a 81 y.o. male with below extensive past medical history who was transferred from a local nursing home facility due to change in mental status and fall. During the fall the patient had significant size lacerations on his forearms. Workup also revealed bilateral effusions and bibasilar opacities consistent with infectious infiltrates. No further history is available at the moment. The patient is nonverbal and is unable to provide further information.   Review of Systems:  Unable to obtain due to the patient's mental status.  Past Medical History  Diagnosis Date  . Cardiac pacemaker in situ     s/p Viatron DDD Pacemaker for sinus node dysfunction  . Orthostatic hypotension   . Hyperlipidemia     mixed  . Atrial fibrillation     not felt to be a coumadin candidate due to falls  . Sinoatrial node dysfunction     s/p PPM (MDT)  . Coronary atherosclerosis of native coronary artery     s/p AWMI 2005 stent LAD  . Diabetes mellitus, type 2   . Chronic renal insufficiency, stage II (mild)   . Intolerance, drug     amniodarone  . Upper GI hemorrhage     2006 Mallory Weiss Tear  . MI (myocardial infarction)     coronary artery status post anterior wall myocardial infarction in 2005 treated with a stent to the left anterior descending.  . Dizziness     recurrent dizziness and falls  . Diabetes mellitus   . Hyperlipidemia   . Renal insufficiency     mild  . Dementia   . Depression   . CHF (congestive heart failure)   . GERD (gastroesophageal reflux disease)    Past Surgical History  Procedure Laterality Date  . Pacemaker placement    . Coronary stent placement     Social History:  reports that he quit smoking about 54 years ago. He does  not have any smokeless tobacco history on file. He reports that he does not drink alcohol or use illicit drugs.  Allergies  Allergen Reactions  . Amiodarone Hcl Rash  . Sulfonamide Derivatives Hives    Family History  Problem Relation Age of Onset  . Coronary artery disease Brother   . Diabetes Mother      Prior to Admission medications   Medication Sig Start Date End Date Taking? Authorizing Provider  acetaminophen (TYLENOL) 650 MG CR tablet Take 650 mg by mouth every 8 (eight) hours as needed for pain.   Yes Historical Provider, MD  albuterol (PROVENTIL) (2.5 MG/3ML) 0.083% nebulizer solution Take 2.5 mg by nebulization every 4 (four) hours as needed for wheezing or shortness of breath.   Yes Historical Provider, MD  atorvastatin (LIPITOR) 10 MG tablet Take 10 mg by mouth every morning.    Yes Historical Provider, MD  beta carotene w/minerals (OCUVITE) tablet Take 1 tablet by mouth daily.   Yes Historical Provider, MD  brimonidine (ALPHAGAN) 0.2 % ophthalmic solution Place 1 drop into both eyes 2 (two) times daily. Wait 3-5 minutes between each drop   Yes Historical Provider, MD  clopidogrel (PLAVIX) 75 MG tablet Take 75 mg by mouth daily.   Yes Historical Provider, MD  Cranberry 250 MG CAPS Take 500 mg by mouth 2 (two) times daily.  Yes Historical Provider, MD  dextromethorphan (DELSYM) 30 MG/5ML liquid Take 5 mLs by mouth 2 (two) times daily as needed for cough.    Yes Historical Provider, MD  digoxin (LANOXIN) 0.25 MG tablet Take 250 mcg by mouth every morning. *HOLD IF HEART RATE IS LESS THAN 60*   Yes Historical Provider, MD  Emollient (MINERIN) LOTN Apply 1 application topically at bedtime. Applies to feet   Yes Historical Provider, MD  feeding supplement, ENSURE ENLIVE, (ENSURE ENLIVE) LIQD Take 237 mLs by mouth 2 (two) times daily after a meal. 07/04/14  Yes Kathlen ModyVijaya Akula, MD  feeding supplement, ENSURE, (ENSURE) PUDG Take 1 Container by mouth daily. 07/04/14  Yes Kathlen ModyVijaya Akula, MD    fludrocortisone (FLORINEF) 0.1 MG tablet Take 0.1 mg by mouth 2 (two) times daily.    Yes Historical Provider, MD  furosemide (LASIX) 20 MG tablet Take 20 mg by mouth every morning.    Yes Historical Provider, MD  latanoprost (XALATAN) 0.005 % ophthalmic solution Place 1 drop into both eyes every evening. At 8 pm   Yes Historical Provider, MD  metoprolol tartrate (LOPRESSOR) 25 MG tablet Take 25 mg by mouth 2 (two) times daily.   Yes Historical Provider, MD  Multiple Vitamins-Minerals (THEREMS M PO) Take 1 tablet by mouth every morning.   Yes Historical Provider, MD  Polyethyl Glycol-Propyl Glycol (SYSTANE ULTRA) 0.4-0.3 % SOLN Place 1 drop into both eyes 4 (four) times daily.   Yes Historical Provider, MD  potassium chloride (KLOR-CON) 20 MEQ packet Take 20 mEq by mouth every morning. Mix with 4-6 ounces of water   Yes Historical Provider, MD  ranitidine (ZANTAC) 150 MG tablet Take 150 mg by mouth at bedtime.   Yes Historical Provider, MD  sodium chloride (OCEAN) 0.65 % SOLN nasal spray Place 2 sprays into both nostrils 2 (two) times daily.   Yes Historical Provider, MD  traZODone (DESYREL) 50 MG tablet Take 50 mg by mouth at bedtime as needed for sleep.   Yes Historical Provider, MD  venlafaxine (EFFEXOR) 37.5 MG tablet Take 37.5 mg by mouth every morning.    Yes Historical Provider, MD   Physical Exam: Filed Vitals:   10/11/14 1915 10/11/14 2104 10/11/14 2156 10/11/14 2220  BP: 131/76 116/60 126/88 110/71  Pulse: 76 67 89 91  Temp: 95.5 F (35.3 C) 96.4 F (35.8 C) 97.2 F (36.2 C) 97.5 F (36.4 C)  TempSrc: Other (Comment) Other (Comment) Other (Comment) Oral  Resp: 16 18 18 18   Weight:    55.8 kg (123 lb 0.3 oz)  SpO2: 92% 94% 94% 93%    Wt Readings from Last 3 Encounters:  10/11/14 55.8 kg (123 lb 0.3 oz)  07/07/14 50.404 kg (111 lb 1.9 oz)  07/04/14 49.2 kg (108 lb 7.5 oz)    General:  Appears tremulous due to hypothermia Eyes: PERRL, normal lids, irises &  conjunctiva ENT: Unable to evaluate hearing due to the patient's mental status, lips & tongue are dry. Neck: no LAD, masses or thyromegaly Cardiovascular: Irregularly irregular. No LE edema. Telemetry: Atrial fibrillation. Respiratory: CTA bilaterally, no w/r/r. Normal respiratory effort. Abdomen: soft, ntnd Skin: no rash or induration seen on limited exam Musculoskeletal: The patient has tremors. Bilateral lacerations on both forearms. Dressing from the lacerations in placed. Psychiatric: Nonverbal. Neurologic: Does not respond to verbal commands, responds to touch.            Labs on Admission:  Basic Metabolic Panel:  Recent Labs Lab 10/11/14 1730  NA 135  K 4.0  CL 100  CO2 28  GLUCOSE 156  BUN 25  CREATININE 1.18  CALCIUM 8.4   Liver Function Tests: No results for input(s): AST, ALT, ALKPHOS, BILITOT, PROT, ALBUMIN in the last 168 hours. No results for input(s): LIPASE, AMYLASE in the last 168 hours. No results for input(s): AMMONIA in the last 168 hours. CBC:  Recent Labs Lab 10/11/14 1730  WBC 3.9  HGB 11.0  HCT 33.6  MCV 87.5  PLT 167   Cardiac Enzymes: No results for input(s): CKTOTAL, CKMB, CKMBINDEX, TROPONINI in the last 168 hours.  BNP (last 3 results)  Recent Labs  07/01/14 2100 07/03/14 0529  BNP 1697.0 699.0    ProBNP (last 3 results)  Recent Labs  12/28/13 0700  PROBNP 14620.0    CBG: No results for input(s): GLUCAP in the last 168 hours.  Radiological Exams on Admission: Dg Chest 2 View  10/11/2014   CLINICAL DATA:  Fall  EXAM: CHEST  2 VIEW  COMPARISON:  07/01/2014  FINDINGS: Moderate left pleural effusion.  Small right pleural effusion.  Associated bilateral lower lobe opacities, likely atelectasis.  No pneumothorax.  The heart is top-normal in size.  Left subclavian pacemaker.  No frank interstitial edema.  IMPRESSION: Moderate left and small right pleural effusions.  Associated bilateral lower lobe opacities, likely  atelectasis.  No frank interstitial edema.   Electronically Signed   By: Charline Bills M.D.   On: 10/11/2014 19:04    EKG: Independently reviewed.  Vent. rate 71 BPM PR interval * ms QRS duration 94 ms QT/QTc 362/393 ms P-R-T axes -1 -40 191 Pacemaker spikes or artifacts Atrial fibrillation Inferior infarct, old Probable anterior infarct, age indeterminate.  Assessment/Plan Principal Problem:   Change in mental status Active Problems:   Healthcare acquired pneumonia.   Type 2 diabetes mellitus   Atrial fibrillation   Chronic atrial fibrillation   Elevated lactic acid level   Laceration of forearm, right   Dementia   1. Admit to MedSurg. Continue healthcare acquired pneumonia antibiotic coverage. Keep nothing by mouth until the patient's mental status is improved. Resume oral medications once able to tolerate oral intake. Repeat lactic acid level is better. Follow-up chemistry and CBC in the morning. Supportive care. Prognosis is poor given the patient's extensive medical history and advanced age.    Code Status: DO NOT RESUSCITATE. DVT Prophylaxis: SCDs. Family Communication: Marylene Buerger Daughter [phone]  Disposition Plan: Return to skilled nursing facility.  Time spent: 60 minutes  Bobette Mo [facility] Pager [phone].

## 2014-10-12 NOTE — Progress Notes (Signed)
Utilization review completed.  

## 2014-10-13 DIAGNOSIS — J189 Pneumonia, unspecified organism: Secondary | ICD-10-CM | POA: Diagnosis present

## 2014-10-13 DIAGNOSIS — R1314 Dysphagia, pharyngoesophageal phase: Secondary | ICD-10-CM | POA: Diagnosis present

## 2014-10-13 DIAGNOSIS — A419 Sepsis, unspecified organism: Principal | ICD-10-CM

## 2014-10-13 DIAGNOSIS — I5023 Acute on chronic systolic (congestive) heart failure: Secondary | ICD-10-CM | POA: Diagnosis present

## 2014-10-13 LAB — CBC
HCT: 33.7 % — ABNORMAL LOW (ref 39.0–52.0)
HEMOGLOBIN: 10.7 g/dL — AB (ref 13.0–17.0)
MCH: 28.2 pg (ref 26.0–34.0)
MCHC: 31.8 g/dL (ref 30.0–36.0)
MCV: 88.9 fL (ref 78.0–100.0)
PLATELETS: 137 10*3/uL — AB (ref 150–400)
RBC: 3.79 MIL/uL — AB (ref 4.22–5.81)
RDW: 16.8 % — ABNORMAL HIGH (ref 11.5–15.5)
WBC: 3.8 10*3/uL — ABNORMAL LOW (ref 4.0–10.5)

## 2014-10-13 LAB — LEGIONELLA ANTIGEN, URINE

## 2014-10-13 LAB — DIGOXIN LEVEL: DIGOXIN LVL: 0.7 ng/mL — AB (ref 0.8–2.0)

## 2014-10-13 MED ORDER — LEVOFLOXACIN 500 MG PO TABS: 500.0000 mg | ORAL_TABLET | Freq: Every day | ORAL | Status: AC

## 2014-10-13 MED ORDER — DIGOXIN 0.25 MG/ML IJ SOLN
0.5000 mg | Freq: Once | INTRAMUSCULAR | Status: AC
  Administered 2014-10-13: 0.5 mg via INTRAVENOUS
  Filled 2014-10-13: qty 2

## 2014-10-13 MED ORDER — FUROSEMIDE 20 MG PO TABS: 20.0000 mg | ORAL_TABLET | Freq: Two times a day (BID) | ORAL | Status: AC

## 2014-10-13 MED ORDER — RESOURCE THICKENUP CLEAR PO POWD
ORAL | Status: DC | PRN
  Filled 2014-10-13: qty 125

## 2014-10-13 NOTE — Care Management Important Message (Signed)
Important Message  Patient Details Letter give to Emily/ RN to present to patientImportant Message  Patient Details  Name: [name] [name] MRN: [id number] Date of Birth: 1922/10/03   Medicare Important Message Given:  Yes-second notification given    Haskell FlirtJamison, Eaden Hettinger 10/13/2014, 12:13 PM Name: [name] LenisHoward H [name] MRN: [id number] Date of Birth: 1922/10/03   Medicare Important Message Given:  Yes-second notification given    Haskell FlirtJamison, Bethan Adamek 10/13/2014, 12:13 PM

## 2014-10-13 NOTE — Evaluation (Signed)
Clinical/Bedside Swallow Evaluation Patient Details  Name: [name] [name] MRN: [id number] Date of Birth: [birth year]  Today's Date: 10/13/2014 Time: SLP Start Time (ACUTE ONLY): 0830 SLP Stop Time (ACUTE ONLY): 0855 SLP Time Calculation (min) (ACUTE ONLY): 25 min  Past Medical History:  Past Medical History  Diagnosis Date  . Cardiac pacemaker in situ     s/p Viatron DDD Pacemaker for sinus node dysfunction  . Orthostatic hypotension   . Hyperlipidemia     mixed  . Atrial fibrillation     not felt to be a coumadin candidate due to falls  . Sinoatrial node dysfunction     s/p PPM (MDT)  . Coronary atherosclerosis of native coronary artery     s/p AWMI 2005 stent LAD  . Diabetes mellitus, type 2   . Chronic renal insufficiency, stage II (mild)   . Intolerance, drug     amniodarone  . Upper GI hemorrhage     2006 Mallory Weiss Tear  . MI (myocardial infarction)     coronary artery status post anterior wall myocardial infarction in 2005 treated with a stent to the left anterior descending.  . Dizziness     recurrent dizziness and falls  . Diabetes mellitus   . Hyperlipidemia   . Renal insufficiency     mild  . Dementia   . Depression   . CHF (congestive heart failure)   . GERD (gastroesophageal reflux disease)    Past Surgical History:  Past Surgical History  Procedure Laterality Date  . Pacemaker placement    . Coronary stent placement     HPI:  81 yo [facility], diagnosed with bilateral opacities likely ATX and left/right pleural effusions per CXR.  PMH + for smoking, dementia, depression, MI, UGI tear, HLD, Afib.  Swallow evaluation ordered.  Pt found with viscous secretions that he coughed and expectorated.   Assessment / Plan / Recommendation Clinical Impression  Pt presents with ongoing evidence of aspiration with nectar thickened liquids characterized by immediate strong cough s/p swallow.  No symptoms of aspiration with puree/honey and  fortunately, pt is an audible aspirator per prior MBS.   SLP reviewed MBS video from May 2016 saved in EPIC and agree with diet at SNF of puree/honey based on evaluation.    Hopeful for pt to stay hydrated and recommend follow up at SNF for dysphagia managament. If hydration becomes an issues, ? If pt would benefit from [employer] Water Protocol.     Aspiration Risk  Moderate    Diet Recommendation Stage 1 baby food;Honey   Medication Administration: Whole meds with puree Compensations: Slow rate;Small sips/bites (pt to rest if short of breath or coughing)    Other  Recommendations Other Recommendations: Order thickener from pharmacy   Follow Up Recommendations    SNF   Frequency and Duration        Pertinent Vitals/Pain Afeb, decreased     Swallow Study Prior Functional Status   see HHX    General Date of Onset: 10/13/14 Other Pertinent Information: 81 [facility], diagnosed with bilateral opacities likely ATX and left/right pleural effusions per CXR.  PMH + for smoking, dementia, depression, MI, UGI tear, HLD, Afib.  Swallow evaluation ordered.  Type of Study: Bedside swallow evaluation Diet Prior to this Study: NPO Temperature Spikes Noted: No Respiratory Status: Supplemental O2 delivered via (comment) History of Recent Intubation: No Behavior/Cognition: Alert;Cooperative Oral Cavity - Dentition: Adequate natural dentition/normal for age  Self-Feeding Abilities: Able to feed self Patient Positioning: Upright in bed Baseline Vocal Quality: Low vocal intensity;Hoarse Volitional Cough: Strong Volitional Swallow: Able to elicit    Oral/Motor/Sensory Function Overall Oral Motor/Sensory Function: Appears within functional limits for tasks assessed   Ice Chips Ice chips: Not tested Other Comments: pt declined to consume ice chip boluses   Thin Liquid Thin Liquid: Not tested Other Comments: due to aspiration on prior MBS in May 2016    Nectar Thick Nectar Thick  Liquid: Impaired Presentation: Self Fed;Straw Pharyngeal Phase Impairments: Cough - Immediate Other Comments: immediate significant coughing s/p swallow   Honey Thick Honey Thick Liquid: Within functional limits Presentation: Cup;Self fed   Puree Puree: Within functional limits Presentation: Self Fed;Spoon   Solid   GO    Solid: Not tested Other Comments: due to pt on puree diet at SNF and admission with ? pna      [name] Burnet, MS [facility] SLP [phone]

## 2014-10-13 NOTE — Progress Notes (Signed)
[facility].

## 2014-10-13 NOTE — Consult Note (Addendum)
WOC wound consult note Reason for Consult: Consult requested for multiple skin tears, pt fell prior to admission. Wound type: Right elbow with full thickness abrasion 1X1X.1cm; skin approximated over 90% of wound.  Wound bed dry dark red, small amt yellow drainage, no odor. Left elbow with full thickness abrasions,1X1X.1cm, .8X.8X.1cm ; skin approximated over 90% of wounds.  Wound beds dry dark red, small amt yellow drainage, no odor. Left forearm with degloving abrasion which involves anterior and posterior forearm; skin approximated over 80% of wound, remaining 20% is dark purple and loose near wrist. The old clotted blood underneath the skin may eventually cause this flap of skin skin to become nonviable. Open wound is beefy red and bleeds small amt easily when touched, entire arm is very painful.  Affected area approx 20X30X.2cm. Dressing procedure/placement/frequency: Foam dressing to bilat elbows to protect and promote healing.  Double-folded layer of Vaseline gauze to left forearm to promote moist healing and decrease adherence and discomfort with dressing changes. Recommend the loose flap of skin be checked by a physician weekly to assess the viability of skin as wound progresses.  Discussed plan of care with patient, he verbalized understanding and is preparing to transfer to a SNF today. Please re-consult if further assistance is needed.  Thank-you,  Cammie Mcgeeawn Nafis Farnan MSN, RN, CWOCN, [place]CN-AP, CNS [phone]

## 2014-10-13 NOTE — Clinical Social Work Note (Signed)
Clinical Social Work Assessment  Patient Details  Name: TEDD COTTRILL MRN: [id number] Date of Birth: 1922-08-27  Date of referral:  10/13/14               Reason for consult:  Discharge Planning                Permission sought to share information with:  Family Supports Permission granted to share information::  Yes, Verbal Permission Granted  Name::     Katharine Look  Agency::     Relationship::  dtr  Contact Information:  [phone]  Housing/Transportation Living arrangements for the past 2 months:  [place] of Information:  Adult Children Patient Interpreter Needed:  None Criminal Activity/Legal Involvement Pertinent to Current Situation/Hospitalization:  No - Comment as needed Significant Relationships:  Adult Children Lives with:  Facility Resident Do you feel safe going back to the place where you live?  Yes Need for family participation in patient care:  Yes (Comment)  Care giving concerns:  Patient's needs are being met at [facility] and patient plans to return.   Social Worker assessment / plan:  CSW received referral in order to complete psychosocial assessment. CSW reviewed chart and met with patient at bedside. Patient confused and unable to fully participate in assessment. CSW called and spoke with dtr Katharine Look) via phone.   Dtr reports that patient is LT resident at [facility] and plans to return at DC. Dtr has spoken with MD and aware of DC today. CSW spoke with [facility] who is agreeable to accept patient back today.   CSW completed FL2 and faxed over DC summary. CSW will arrange transportation after RN evaluates patient for wound care.  Employment status:  Retired Nurse, adult PT Recommendations:  Not assessed at this time Information / Referral to community resources:  [place]  Patient/Family's Response to care:  Patient unable to participate in assessment. Dtr engaged and appreciative of information. Dtr  wishes to be kept informed when PTAR is called.  Patient/Family's Understanding of and Emotional Response to Diagnosis, Current Treatment, and Prognosis:  Dtr reports patient is LT resident at [facility] and older. Dtr understanding that patient will continue to have medical problems but reports she wants patient to have a good quality of life.   Emotional Assessment Appearance:  Appears older than stated age Attitude/Demeanor/Rapport:  Lethargic Affect (typically observed):  Withdrawn Orientation:  Oriented to Self Alcohol / Substance use:  Not Applicable Psych involvement (Current and /or in the community):  No (Comment)  Discharge Needs  Concerns to be addressed:  No discharge needs identified Readmission within the last 30 days:  Yes Current discharge risk:  None Barriers to Discharge:  No Barriers Identified   Boone Master, [place] 10/13/2014, 10:30 AM [phone]

## 2014-10-13 NOTE — Discharge Summary (Addendum)
Physician Discharge Summary  [name] [name]:[id number] DOB: 06-13-1922 DOA: 10/11/2014  PCP: Georgann Housekeeper, MD  Admit date: 10/11/2014 Discharge date: 10/13/2014  Time spent: 35 minutes  Recommendations for Outpatient Follow-up:  1. Discharged to skilled nursing facility. Patient will complete a total 7 days of antibiotics on 7/22. 2. Please follow final blood cultures and Legionella antigen results sent on 7/16. 3. Please follow wound care recommendations for bilateral arm lacerations below.    Discharge Diagnoses:  Principal Problem: Sepsis secondary to HCAP (healthcare-associated pneumonia)   Active Problems:   Acute on chronic systolic CHF (congestive heart failure)   Type 2 diabetes mellitus   Atrial fibrillation   Chronic atrial fibrillation   Protein-calorie malnutrition, severe   Elevated lactic acid level   Change in mental status   Laceration of forearm, right   Dementia   Dysphagia, pharyngoesophageal phase   Discharge Condition: Fair  Diet recommendation: Dysphagia level I with honey thick consistency  Filed Weights   10/11/14 2220  Weight: 55.8 kg (123 lb 0.3 oz)    History of present illness:  81 year old male with history of A. fib not on anticoagulation due to falls, CAD with history of MI status post stent, cardiomyopathy, cardiac pacemaker, sick sinus syndrome status post pacemaker, type 2 diabetes mellitus, chronic kidney disease stage II, severe dementia brought from nursing home with fall and sustained a significant laceration on his left forearm. Patient was recently diagnosed of pneumonia at the skilled nursing facility and was being treated with Augmentin. Patient was hypothermic and dissected in the ED with chest x-ray showing possible infiltrate with bilateral pleural effusion. Patient admitted for mild sepsis secondary to healthcare associated pneumonia.  Hospital Course:  Acute hypoxic respiratory failure secondary to healthcare associated  pneumonia and possible CHF Based on empiric IV vancomycin and Zosyn. Urine for strep antigen negative. Preliminary blood cultures negative. Legionella antigen pending.  supportive care with Tylenol and O2 via nasal cannula.  Symptoms improved and patient remains afebrile.  Mild sepsis secondary to pneumonia Patient presented with hypothermia, leukopenia and elevated lactic acid. Now resolved. Still has mild leukopenia can be followed up as outpatient. Patient treated empirically with vancomycin and Zosyn. We'll transition to oral Levaquin to complete a seven-day course of antibiotic.  Fall with left forearm laceration Continue with dry dressing. Held Plavix while in the hospital. No further bleeding or drop in H&H noted. Evaluated by wound care this morning with following recommendations. " Foam dressing to bilateral  elbows to protect and promote healing. Double-folded layer of Vaseline gauze to left forearm to promote moist healing and decrease adherence and discomfort with dressing changes. Recommend the loose flap area be assessed by a physician weekly to assess the viability of skin as wound progresses."   Can resume Plavix upon discharge.  Acute systolic CHF with EF of 30-35% Mild symptoms. Will increase Lasix dose to 20 mg twice daily. Continue metoprolol, digoxin and statin. Digoxin level was subtherapeutic so was given an additional dose prior to discharge today.  Coronary artery disease and history of stroke Resume plavix. Continue statin and beta blocker.  Depression Continue Effexor and trazodone.  History of A. fib Rate controlled. On beta blocker. Not a candidate for Coumadin due to fall risk.    Dysphagia Patient reportedly coughing even upon swallowing water. On dysphagia diet at facility. Seen by swallow nurse again would include the MBS video from May 2016 done in the hospital. Recommend continued dysphagia level I diet with honey thick liquid  and followed at the  facility.   Code Status: DO NOT RESUSCITATE  Family Communication: discussed plan with daughter at bedside  Disposition Plan: Return to skilled nursing facility    Consultants:  none  Procedures:  none  Antibiotics:  IV vanco and zosyn 7/16- 7/18    Discharge Exam: Filed Vitals:   10/13/14 0411  BP: 142/64  Pulse: 65  Temp:   Resp: 20    General: Elderly thin built male lying in bed in no acute distress  HEENT: Pallor present, moist oral mucosa, supple neck  Chest: Diminished bibasilar breath sounds, no rhonchi or wheeze  CVS: S1 and S2 is irregularly irregular, no murmurs rub or gallop  VI: Soft, nondistended, nontender, bowel sounds present  Musculoskeletal: Laceration over left forearm with significant bruising and cyanosis extending to his left fingers, full thickness tear over rt elbow.  CNS: Alert and oriented partially to place and person, nonfocal  Discharge Instructions    Current Discharge Medication List    START taking these medications   Details  levofloxacin (LEVAQUIN) 500 MG tablet Take 1 tablet (500 mg total) by mouth daily. Qty: 5 tablet, Refills: 0      CONTINUE these medications which have CHANGED   Details  furosemide (LASIX) 20 MG tablet Take 1 tablet (20 mg total) by mouth 2 (two) times daily. Qty: 30 tablet, Refills: 0      CONTINUE these medications which have NOT CHANGED   Details  acetaminophen (TYLENOL) 650 MG CR tablet Take 650 mg by mouth every 8 (eight) hours as needed for pain.    albuterol (PROVENTIL) (2.5 MG/3ML) 0.083% nebulizer solution Take 2.5 mg by nebulization every 4 (four) hours as needed for wheezing or shortness of breath.    atorvastatin (LIPITOR) 10 MG tablet Take 10 mg by mouth every morning.     !! beta carotene w/minerals (OCUVITE) tablet Take 1 tablet by mouth daily.    brimonidine (ALPHAGAN) 0.2 % ophthalmic solution Place 1 drop into both eyes 2 (two) times daily. Wait 3-5 minutes between  each drop    clopidogrel (PLAVIX) 75 MG tablet Take 75 mg by mouth daily.    Cranberry 250 MG CAPS Take 500 mg by mouth 2 (two) times daily.    dextromethorphan (DELSYM) 30 MG/5ML liquid Take 5 mLs by mouth 2 (two) times daily as needed for cough.     digoxin (LANOXIN) 0.25 MG tablet Take 250 mcg by mouth every morning. *HOLD IF HEART RATE IS LESS THAN 60*    Emollient (MINERIN) LOTN Apply 1 application topically at bedtime. Applies to feet    feeding supplement, ENSURE ENLIVE, (ENSURE ENLIVE) LIQD Take 237 mLs by mouth 2 (two) times daily after a meal. Qty: 237 mL, Refills: 12    feeding supplement, ENSURE, (ENSURE) PUDG Take 1 Container by mouth daily. Refills: 0    fludrocortisone (FLORINEF) 0.1 MG tablet Take 0.1 mg by mouth 2 (two) times daily.     latanoprost (XALATAN) 0.005 % ophthalmic solution Place 1 drop into both eyes every evening. At 8 pm    metoprolol tartrate (LOPRESSOR) 25 MG tablet Take 25 mg by mouth 2 (two) times daily.    Multiple Vitamins-Minerals (THEREMS M PO) Take 1 tablet by mouth every morning.    Polyethyl Glycol-Propyl Glycol (SYSTANE ULTRA) 0.4-0.3 % SOLN Place 1 drop into both eyes 4 (four) times daily.    potassium chloride (KLOR-CON) 20 MEQ packet Take 20 mEq by mouth every morning. Mix with 4-6  ounces of water    ranitidine (ZANTAC) 150 MG tablet Take 150 mg by mouth at bedtime.    sodium chloride (OCEAN) 0.65 % SOLN nasal spray Place 2 sprays into both nostrils 2 (two) times daily.    traZODone (DESYREL) 50 MG tablet Take 50 mg by mouth at bedtime as needed for sleep.    venlafaxine (EFFEXOR) 37.5 MG tablet Take 37.5 mg by mouth every morning.          Allergies  Allergen Reactions  . Amiodarone Hcl Rash  . Sulfonamide Derivatives Hives   Follow-up Information    Please follow up.   Why:  with MD at SNF in 1 week       The results of significant diagnostics from this hospitalization (including imaging, microbiology, ancillary and  laboratory) are listed below for reference.    Significant Diagnostic Studies: Dg Chest 2 View  10/11/2014   CLINICAL DATA:  Fall  EXAM: CHEST  2 VIEW  COMPARISON:  07/01/2014  FINDINGS: Moderate left pleural effusion.  Small right pleural effusion.  Associated bilateral lower lobe opacities, likely atelectasis.  No pneumothorax.  The heart is top-normal in size.  Left subclavian pacemaker.  No frank interstitial edema.  IMPRESSION: Moderate left and small right pleural effusions.  Associated bilateral lower lobe opacities, likely atelectasis.  No frank interstitial edema.   Electronically Signed   By: Charline Bills M.D.   On: 10/11/2014 19:04    Microbiology: Recent Results (from the past 240 hour(s))  Culture, blood (routine x 2)     Status: None (Preliminary result)   Collection Time: 10/11/14  6:00 PM  Result Value Ref Range Status   Specimen Description BLOOD RIGHT FOREARM  Final   Special Requests   Final    BOTTLES DRAWN AEROBIC AND ANAEROBIC 4CC BLUE 2CC RED Performed at [facility]    Culture PENDING  Incomplete   Report Status PENDING  Incomplete  MRSA PCR Screening     Status: Abnormal   Collection Time: 10/11/14 10:26 PM  Result Value Ref Range Status   MRSA by PCR POSITIVE (A) NEGATIVE Final    Comment:        The GeneXpert MRSA Assay (FDA approved for NASAL specimens only), is one component of a comprehensive MRSA colonization surveillance program. It is not intended to diagnose MRSA infection nor to guide or monitor treatment for MRSA infections. RESULT CALLED TO, READ BACK BY AND VERIFIED WITH: V.JACKSON,RN AT 0048 ON 10/12/14 BY W.SHEA   Culture, sputum-assessment     Status: None   Collection Time: 10/11/14 10:39 PM  Result Value Ref Range Status   Specimen Description SPUTUM  Final   Special Requests NONE  Final   Sputum evaluation   Final    THIS SPECIMEN IS ACCEPTABLE. RESPIRATORY CULTURE REPORT TO FOLLOW.   Report Status 10/11/2014 FINAL   Final     Labs: Basic Metabolic Panel:  Recent Labs Lab 10/11/14 1730 10/12/14 0513  NA 135 138  K 4.0 3.6  CL 100* 106  CO2 28 26  GLUCOSE 156* 83  BUN 25* 21*  CREATININE 1.18 1.03  CALCIUM 8.4* 7.9*   Liver Function Tests:  Recent Labs Lab 10/12/14 0513  AST 30  ALT 26  ALKPHOS 96  BILITOT 1.7*  PROT 5.7*  ALBUMIN 2.7*   No results for input(s): LIPASE, AMYLASE in the last 168 hours. No results for input(s): AMMONIA in the last 168 hours. CBC:  Recent Labs Lab 10/11/14  1730 10/12/14 0513 10/13/14 0447  WBC 3.9* 3.2* 3.8*  HGB 11.0* 9.6* 10.7*  HCT 33.6* 30.2* 33.7*  MCV 87.5 88.0 88.9  PLT 167 147* 137*   Cardiac Enzymes: No results for input(s): CKTOTAL, CKMB, CKMBINDEX, TROPONINI in the last 168 hours. BNP: BNP (last 3 results)  Recent Labs  07/01/14 2100 07/03/14 0529 10/12/14 0513  BNP 1697.0* 699.0* 795.5*    ProBNP (last 3 results)  Recent Labs  12/28/13 0700  PROBNP 14620.0*    CBG: No results for input(s): GLUCAP in the last 168 hours.     SignedEddie North  [facility] 10/13/2014, 9:37 AM

## 2014-10-13 NOTE — Progress Notes (Signed)
Call to room by Verdell Carminehandra Cotton NT, who stated that she heard bepping and was on her way when she noticed Mr Durene CalHunter  Naked standing peeping out the door of room 1507, she stated running towards the door when Mr Durene CalHunter got down on his knees in  A crawling position, pt lead himself to the ground. Bed rail still up, Bleeding at the IV site,Mr Durene CalHunter was found with no recent injuries, denies pains, smears of BM all over the bed , VS  B/P 93/70 , HR-64, unable to obtain temp at he time. Clean pt up, notified the on call K.Schorr who placed an order in for a safety sitter stat notified unit manager , notified the family Marylene BuergerSandra Presnell. And notified House coverage Joe  RN. Dois DavenportSandra verbalized concerns of wanting to return pt back to previous facility today and also wanting wound care nurse to take a look at pt skin tear from previous fall,  She also stated that she did not want a swallow eval done on her dad. Day shift nurse made aware.

## 2014-10-13 NOTE — Progress Notes (Signed)
Clinical Social Work  RN reports patient is ready to return to SNF. CSW updated dtr Dois Davenport(Sandra) via phone who prefers [facility] even though no guarantee of payment. [facility]. CSW prepared DC packet with FL2, DNR, and DC summary included. RN to call report. CSW arranged [facility] for 3pm pick up. Request #: [id number].  CSW is signing off but available if needed.  LyonsHolly Nusrat Encarnacion, [place] [phone]

## 2014-10-14 LAB — CULTURE, RESPIRATORY: CULTURE: NORMAL

## 2014-10-14 LAB — CULTURE, RESPIRATORY W GRAM STAIN

## 2014-10-17 LAB — CULTURE, BLOOD (ROUTINE X 2)
Culture: NO GROWTH
Culture: NO GROWTH

## 2014-11-27 DEATH — deceased

## 2014-12-24 ENCOUNTER — Encounter: Payer: Self-pay | Admitting: *Deleted

## 2015-01-12 ENCOUNTER — Encounter: Payer: Self-pay | Admitting: *Deleted

## 2016-12-15 IMAGING — CR DG ELBOW COMPLETE 3+V*R*
4 series · 4 of 4 positions shown · non-contrast
Comparison: None.

CLINICAL DATA: Fall with elbow pain and skin tear.

EXAM:
RIGHT ELBOW - COMPLETE 3+ VIEW

[x elbow ap right]
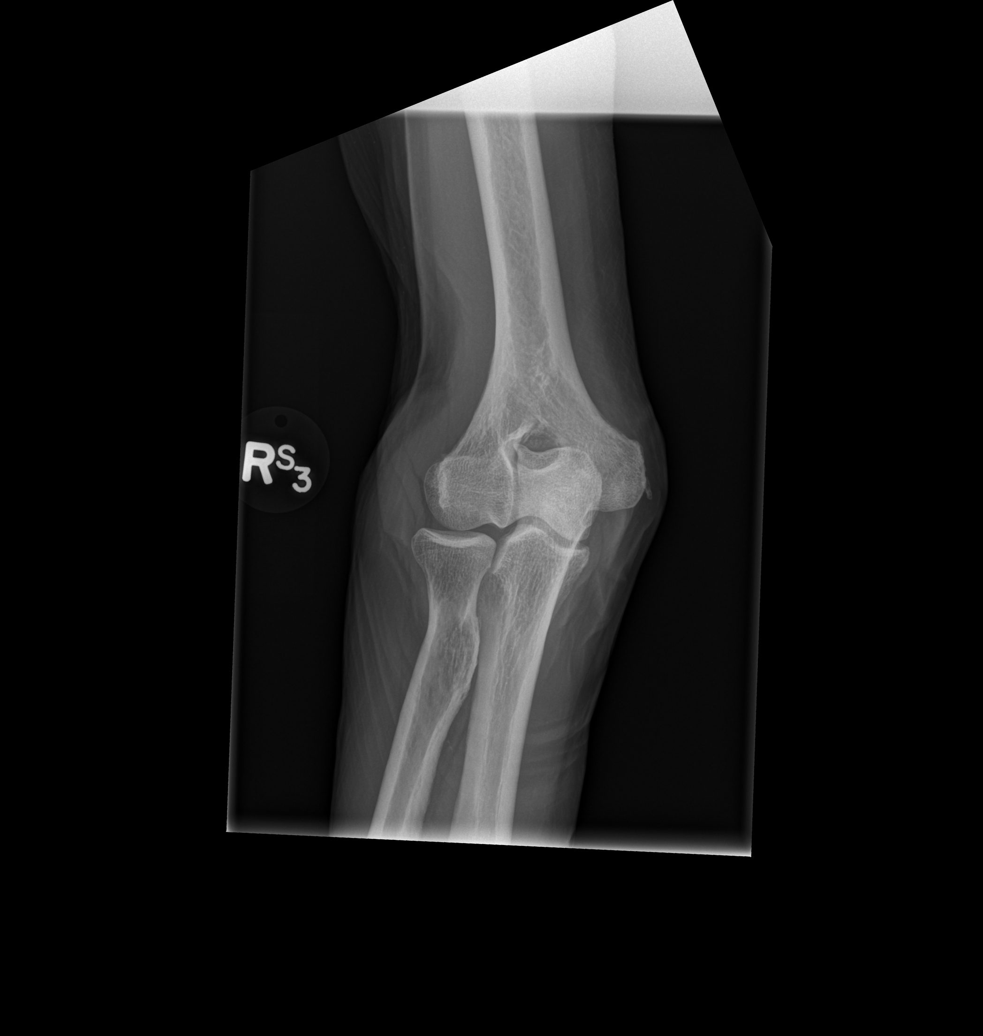

[x elbow obl right (1 of 2)]
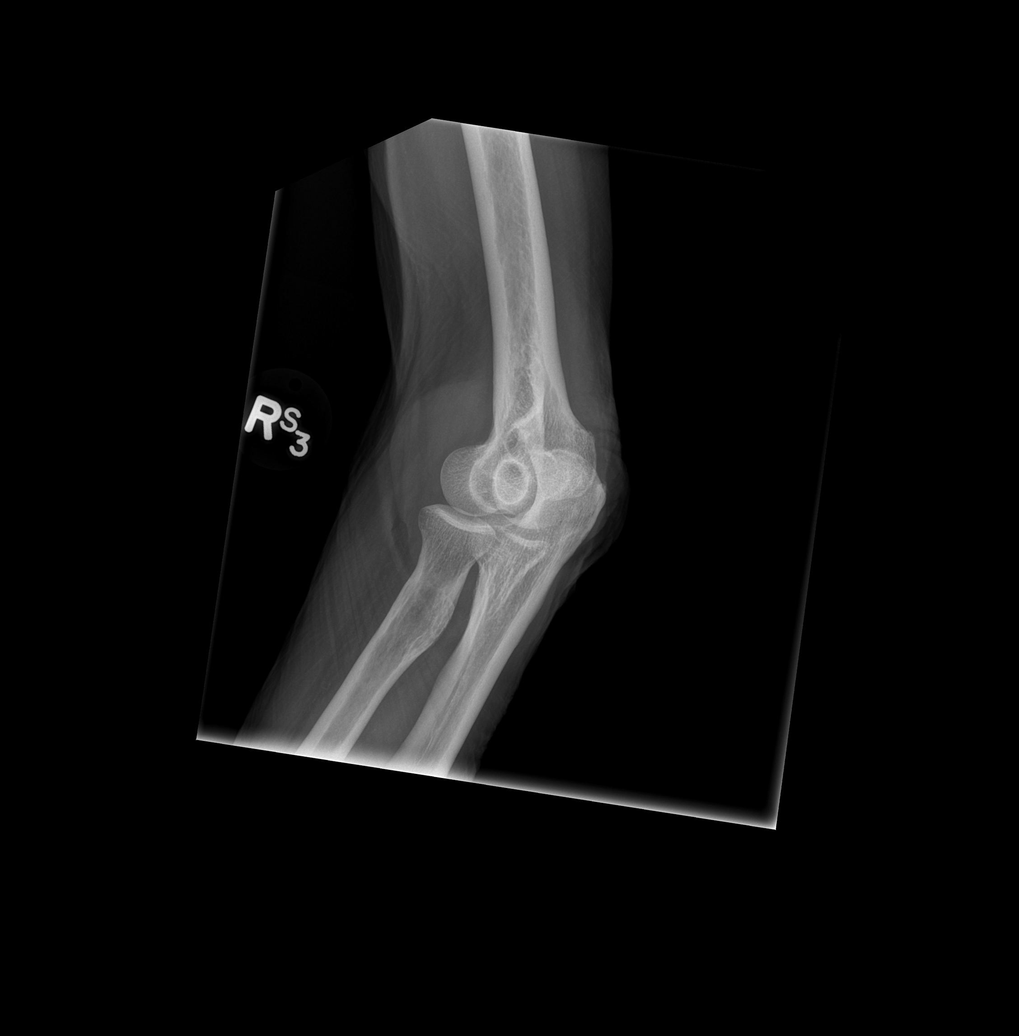

[x elbow obl right (2 of 2)]
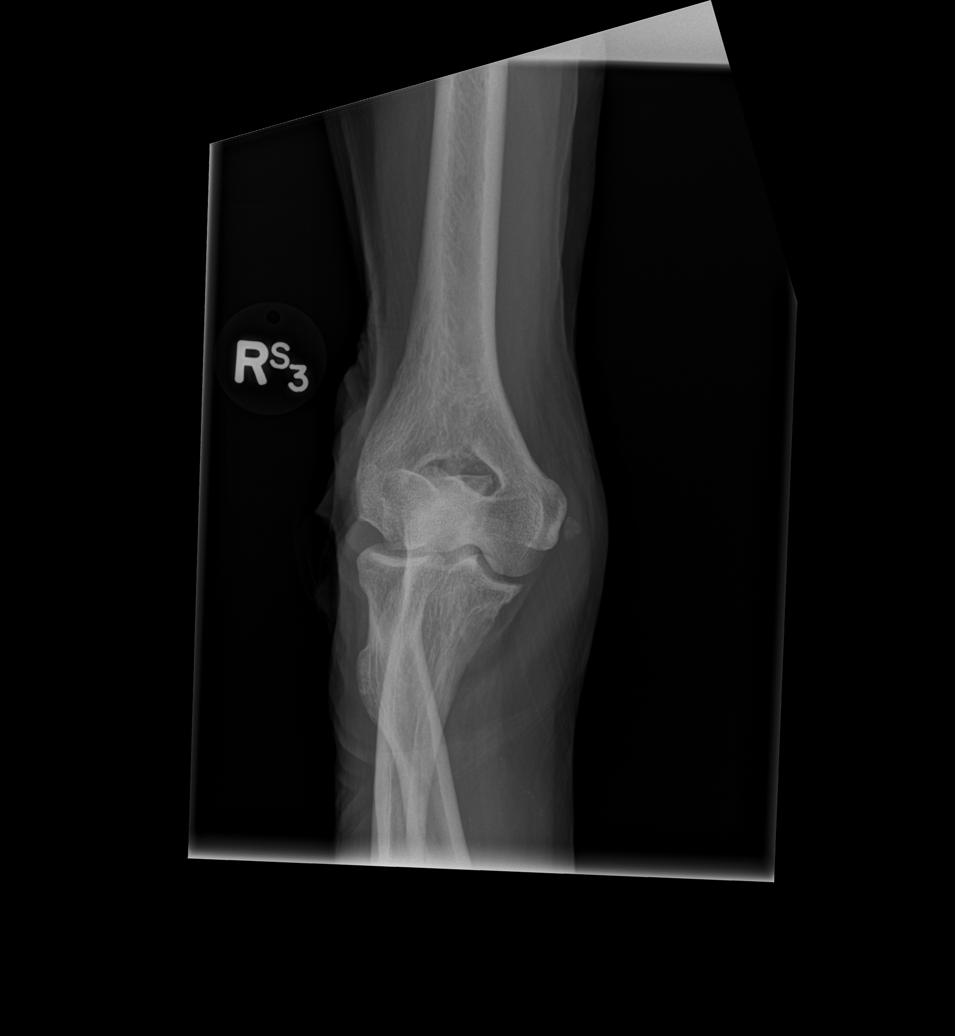

[x elbow lat right]
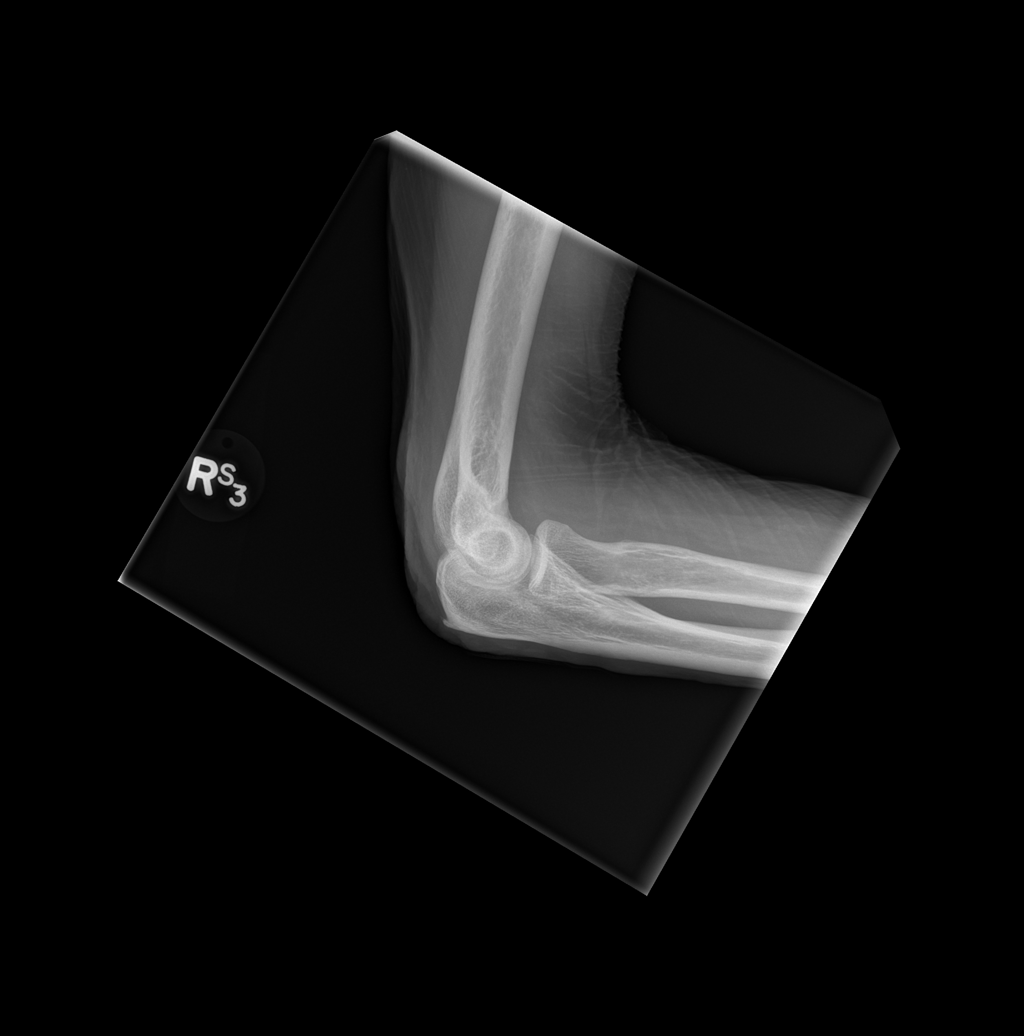

[4 of 4 positions shown; findings below may reference images not displayed]

FINDINGS: Degenerative irregularity about the medial epicondyle of the
humerus. Enthesopathic change at the triceps insertion. No acute
fracture or dislocation. No joint effusion.
IMPRESSION: No acute osseous abnormality.

## 2017-01-26 IMAGING — CT CT HEAD W/O CM
4 of 9 series · 16 of 47 positions shown, 18 images · non-contrast
Comparison: CT head 05/23/2014. CT head and cervical spine
05/20/2014.

CLINICAL DATA: Patient stood up and passed out. Unresponsive for
5-10 minutes. Loss of consciousness.

EXAM:
CT HEAD WITHOUT CONTRAST
CT CERVICAL SPINE WITHOUT CONTRAST
TECHNIQUE: Multidetector CT imaging of the head and cervical spine was
performed following the standard protocol without intravenous
contrast. Multiplanar CT image reconstructions of the cervical spine
were also generated.

[Series 7: coronals · coronal · 0.38mm/px · 3 of 57 slices shown]
[im 15/57  brain]
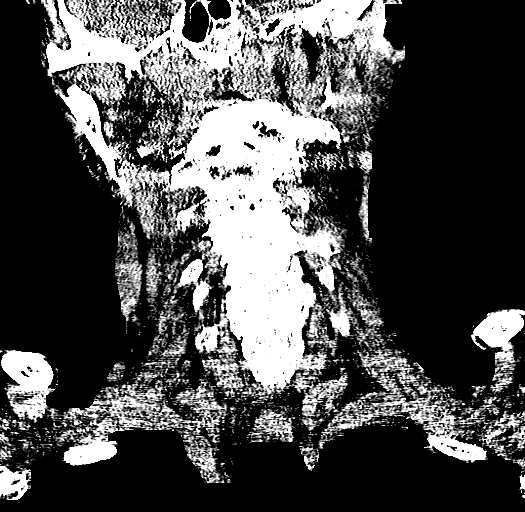
[im 29/57  brain]
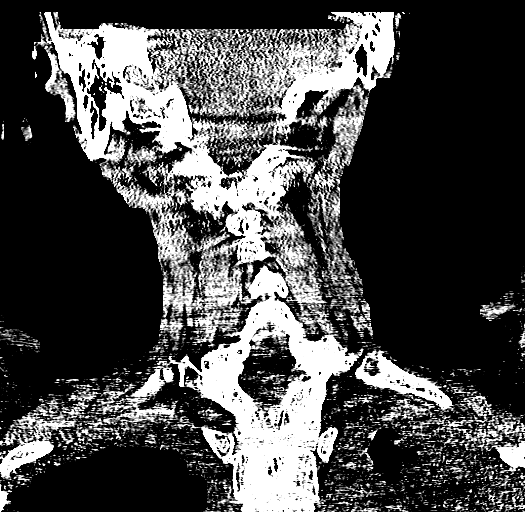
[im 43/57  brain]
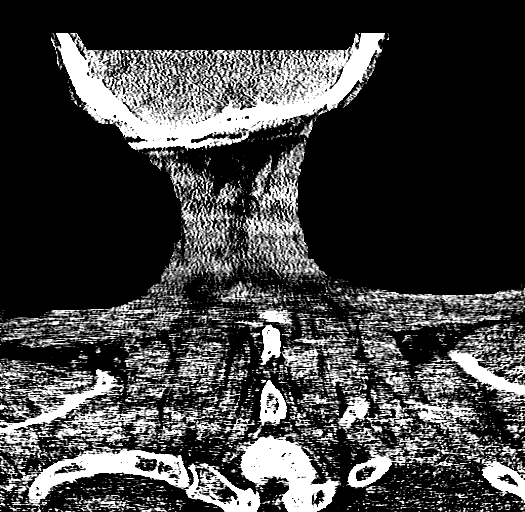

[Series 8: sagittals · sagittal · 0.36mm/px · 2 of 55 slices shown]
[im 19/55  brain]
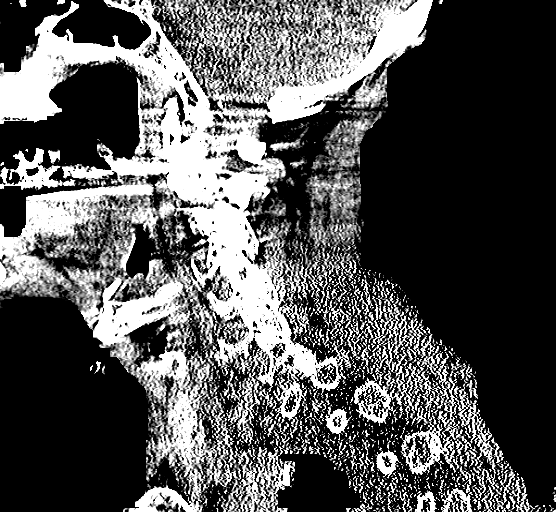
[im 37/55  brain]
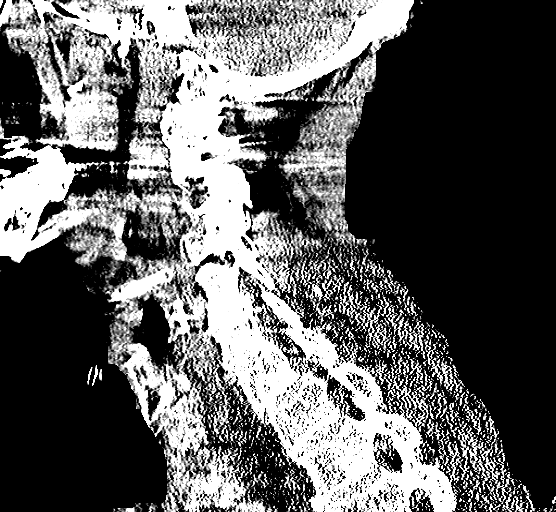

[Series 9: orthogonals · axial · 0.32mm/px · z∈[-257,-121]mm · 6 of 101 slices shown, 8 images (1 of 2)]
[im 15/101  brain]
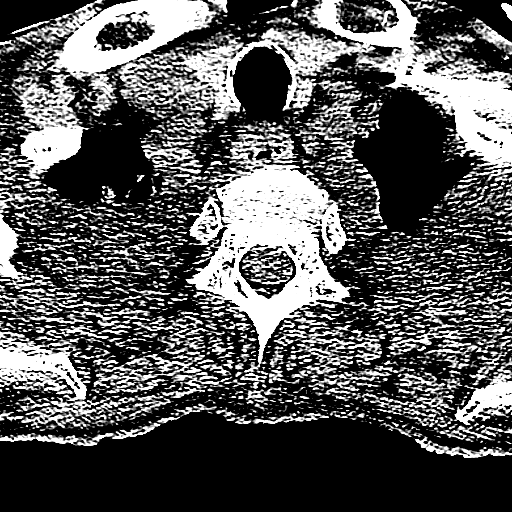
[im 15/101  bone]
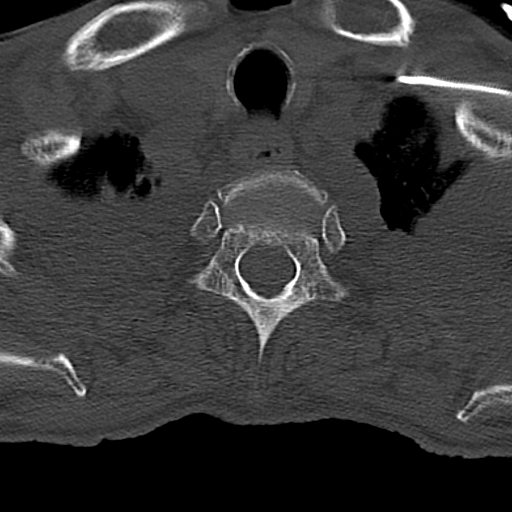
[im 29/101  brain]
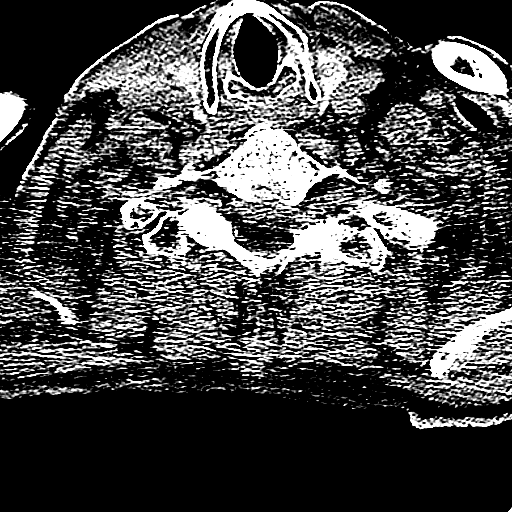
[im 43/101  brain]
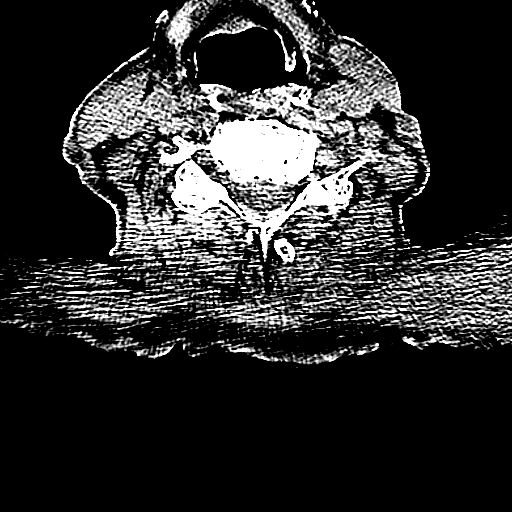
[im 58/101  brain]
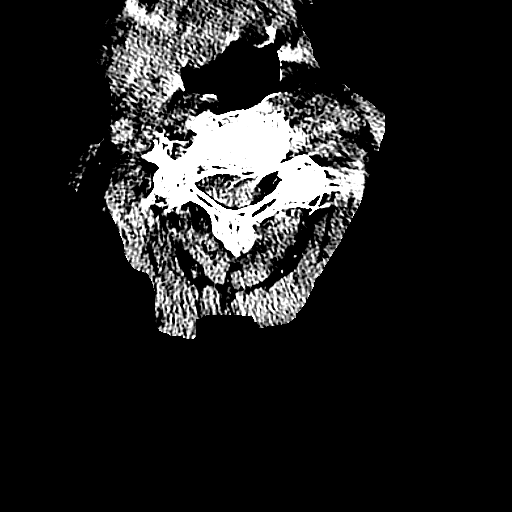
[im 72/101  brain]
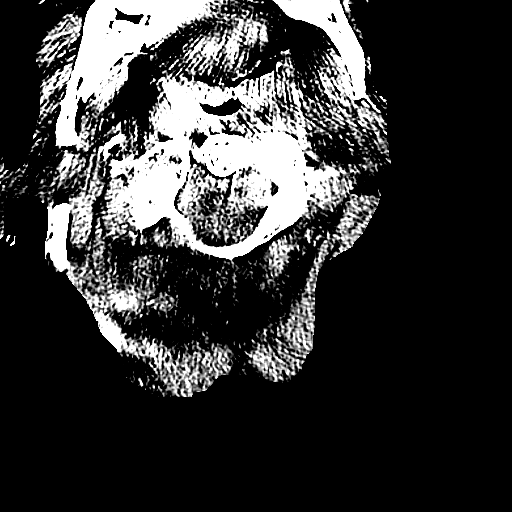
[im 72/101  bone]
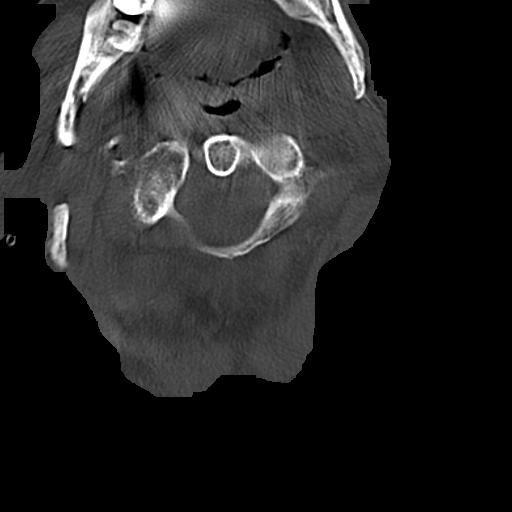
[im 86/101  brain]
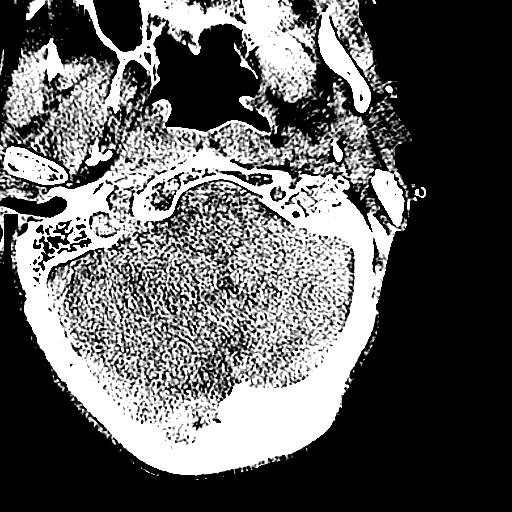

[Series 15: orthogonals · axial · 0.32mm/px · z∈[-230,-125]mm · 5 of 83 slices shown (2 of 2)]
[im 14/83  brain]
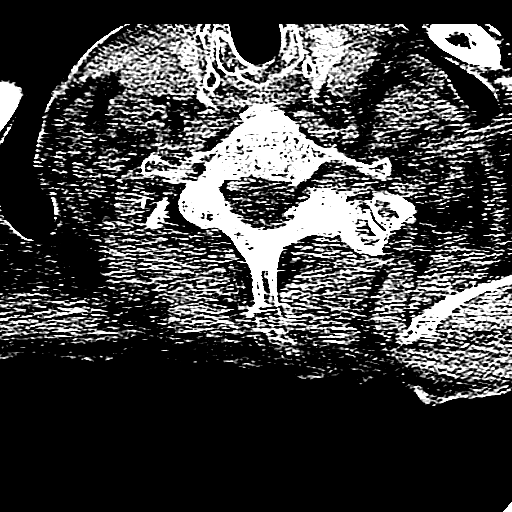
[im 28/83  brain]
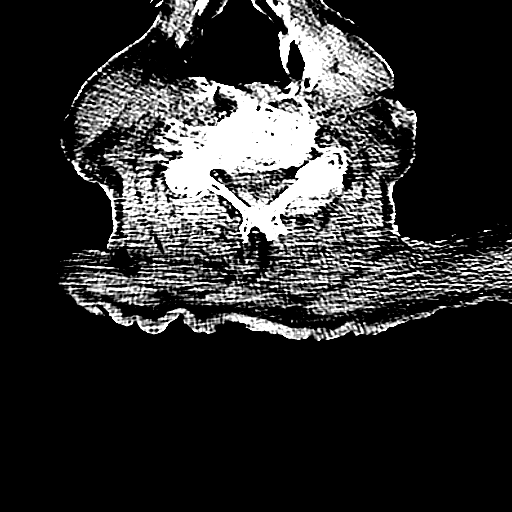
[im 42/83  brain]
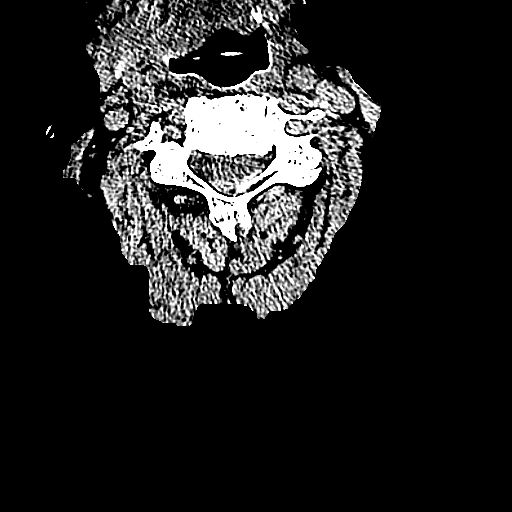
[im 55/83  brain]
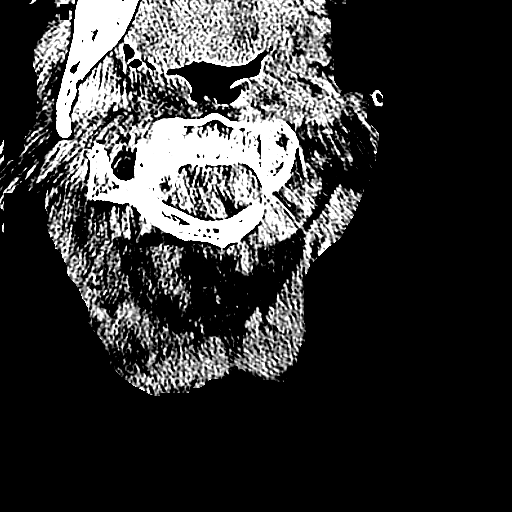
[im 69/83  brain]
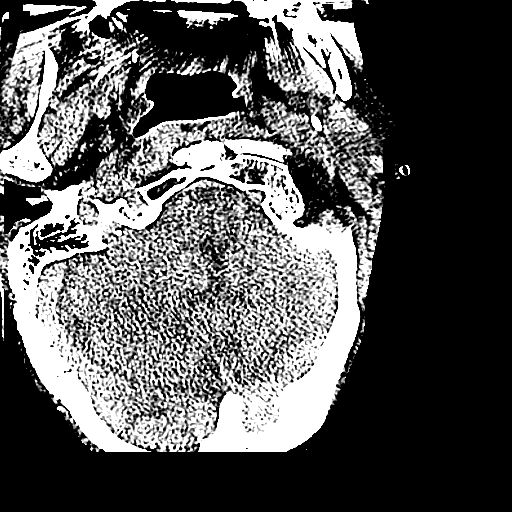

[16 of 47 positions shown; findings below may reference images not displayed]

FINDINGS: CT HEAD FINDINGS

Diffuse cerebral atrophy. Ventricular dilatation consistent with
central atrophy. Low-attenuation changes in the deep white matter
consistent with small vessel ischemia. Old lacunar infarcts in the
deep white matter bilaterally. No mass effect or midline shift. No
abnormal extra-axial fluid collections. Gray-white matter junctions
are distinct. Basal cisterns are not effaced. No evidence of acute
intracranial hemorrhage. No depressed skull fractures. Visualized
paranasal sinuses and mastoid air cells are not opacified.

CT CERVICAL SPINE FINDINGS

Examination is technically limited due to motion artifact despite
repeat imaging. Retrolisthesis of C3 on C4. Slight anterolisthesis
of C4 on C5 and C7 on T1. Alignment is unchanged since prior study.
Degenerative changes throughout the cervical spine with narrowed
cervical interspaces and endplate hypertrophic changes. No vertebral
compression deformities. No prevertebral soft tissue swelling.
Posterior elements appear intact. No focal bone lesion or bone
destruction is appreciated. Soft tissues are unremarkable.
Incidental note of large bilateral pleural effusions.
IMPRESSION: No acute intracranial abnormalities. Chronic atrophy and small
vessel ischemic changes.

Cervical spine is limited due to motion artifact but alignment
appears unchanged since previous study and no acute displaced
fractures identified. Diffuse degenerative change. Large bilateral
pleural effusions.
